# Patient Record
Sex: Female | Born: 1950 | Race: White | Hispanic: No | Marital: Married | Smoking: Former smoker
Health system: Southern US, Community
[De-identification: ages and names within clinical notes are randomized; demographics above are authoritative.]

## PROBLEM LIST (undated history)

## (undated) DIAGNOSIS — D649 Anemia, unspecified: Secondary | ICD-10-CM

## (undated) DIAGNOSIS — I10 Essential (primary) hypertension: Secondary | ICD-10-CM

## (undated) DIAGNOSIS — E669 Obesity, unspecified: Secondary | ICD-10-CM

## (undated) DIAGNOSIS — IMO0001 Reserved for inherently not codable concepts without codable children: Secondary | ICD-10-CM

## (undated) DIAGNOSIS — K56609 Unspecified intestinal obstruction, unspecified as to partial versus complete obstruction: Secondary | ICD-10-CM

## (undated) DIAGNOSIS — C569 Malignant neoplasm of unspecified ovary: Secondary | ICD-10-CM

## (undated) DIAGNOSIS — Z5189 Encounter for other specified aftercare: Secondary | ICD-10-CM

## (undated) DIAGNOSIS — M1990 Unspecified osteoarthritis, unspecified site: Secondary | ICD-10-CM

## (undated) DIAGNOSIS — Z86711 Personal history of pulmonary embolism: Secondary | ICD-10-CM

## (undated) DIAGNOSIS — E039 Hypothyroidism, unspecified: Secondary | ICD-10-CM

## (undated) HISTORY — DX: Reserved for inherently not codable concepts without codable children: IMO0001

## (undated) HISTORY — DX: Obesity, unspecified: E66.9

## (undated) HISTORY — DX: Encounter for other specified aftercare: Z51.89

## (undated) HISTORY — PX: COLON RESECTION: SHX5231

## (undated) HISTORY — PX: ABDOMINAL HYSTERECTOMY: SHX81

## (undated) HISTORY — PX: TOTAL ABDOMINAL HYSTERECTOMY W/ BILATERAL SALPINGOOPHORECTOMY: SHX83

## (undated) HISTORY — DX: Essential (primary) hypertension: I10

## (undated) HISTORY — PX: OTHER SURGICAL HISTORY: SHX169

## (undated) HISTORY — DX: Personal history of pulmonary embolism: Z86.711

## (undated) HISTORY — DX: Malignant neoplasm of unspecified ovary: C56.9

## (undated) HISTORY — DX: Unspecified intestinal obstruction, unspecified as to partial versus complete obstruction: K56.609

---

## 2002-10-04 ENCOUNTER — Encounter: Payer: Self-pay | Admitting: Family Medicine

## 2002-10-04 ENCOUNTER — Ambulatory Visit (HOSPITAL_COMMUNITY): Admission: RE | Admit: 2002-10-04 | Discharge: 2002-10-04 | Payer: Self-pay | Admitting: Family Medicine

## 2004-03-18 ENCOUNTER — Ambulatory Visit (HOSPITAL_COMMUNITY): Admission: RE | Admit: 2004-03-18 | Discharge: 2004-03-18 | Payer: Self-pay | Admitting: Family Medicine

## 2004-03-21 ENCOUNTER — Encounter (HOSPITAL_COMMUNITY): Admission: RE | Admit: 2004-03-21 | Discharge: 2004-04-20 | Payer: Self-pay | Admitting: General Surgery

## 2004-03-28 ENCOUNTER — Inpatient Hospital Stay (HOSPITAL_COMMUNITY): Admission: RE | Admit: 2004-03-28 | Discharge: 2004-03-30 | Payer: Self-pay | Admitting: General Surgery

## 2004-04-12 ENCOUNTER — Inpatient Hospital Stay (HOSPITAL_COMMUNITY): Admission: EM | Admit: 2004-04-12 | Discharge: 2004-04-16 | Payer: Self-pay | Admitting: Emergency Medicine

## 2004-04-15 ENCOUNTER — Ambulatory Visit: Payer: Self-pay | Admitting: Oncology

## 2004-04-17 ENCOUNTER — Encounter (HOSPITAL_COMMUNITY): Admission: RE | Admit: 2004-04-17 | Discharge: 2004-05-17 | Payer: Self-pay | Admitting: Oncology

## 2004-04-17 ENCOUNTER — Encounter: Admission: RE | Admit: 2004-04-17 | Discharge: 2004-04-17 | Payer: Self-pay | Admitting: Oncology

## 2004-04-25 ENCOUNTER — Ambulatory Visit (HOSPITAL_COMMUNITY): Admission: RE | Admit: 2004-04-25 | Discharge: 2004-04-25 | Payer: Self-pay | Admitting: General Surgery

## 2004-04-29 ENCOUNTER — Ambulatory Visit (HOSPITAL_COMMUNITY): Payer: Self-pay | Admitting: Oncology

## 2004-05-20 ENCOUNTER — Ambulatory Visit: Admission: RE | Admit: 2004-05-20 | Discharge: 2004-05-20 | Payer: Self-pay | Admitting: Gynecology

## 2004-05-21 ENCOUNTER — Encounter (HOSPITAL_COMMUNITY): Admission: RE | Admit: 2004-05-21 | Discharge: 2004-06-20 | Payer: Self-pay | Admitting: Oncology

## 2004-05-21 ENCOUNTER — Encounter: Admission: RE | Admit: 2004-05-21 | Discharge: 2004-05-21 | Payer: Self-pay | Admitting: Oncology

## 2004-07-02 ENCOUNTER — Ambulatory Visit (HOSPITAL_COMMUNITY): Payer: Self-pay | Admitting: Oncology

## 2004-07-02 ENCOUNTER — Encounter (HOSPITAL_COMMUNITY): Admission: RE | Admit: 2004-07-02 | Discharge: 2004-08-01 | Payer: Self-pay | Admitting: Oncology

## 2004-07-02 ENCOUNTER — Encounter: Admission: RE | Admit: 2004-07-02 | Discharge: 2004-07-02 | Payer: Self-pay | Admitting: Oncology

## 2004-07-09 ENCOUNTER — Ambulatory Visit: Admission: RE | Admit: 2004-07-09 | Discharge: 2004-07-09 | Payer: Self-pay | Admitting: Gynecology

## 2004-08-06 ENCOUNTER — Encounter: Admission: RE | Admit: 2004-08-06 | Discharge: 2004-08-06 | Payer: Self-pay | Admitting: Oncology

## 2004-08-06 ENCOUNTER — Encounter (HOSPITAL_COMMUNITY): Admission: RE | Admit: 2004-08-06 | Discharge: 2004-09-05 | Payer: Self-pay | Admitting: Oncology

## 2004-08-20 ENCOUNTER — Ambulatory Visit (HOSPITAL_COMMUNITY): Payer: Self-pay | Admitting: Oncology

## 2004-09-09 ENCOUNTER — Encounter: Admission: RE | Admit: 2004-09-09 | Discharge: 2004-09-09 | Payer: Self-pay | Admitting: Oncology

## 2004-09-09 ENCOUNTER — Encounter (HOSPITAL_COMMUNITY): Admission: RE | Admit: 2004-09-09 | Discharge: 2004-10-09 | Payer: Self-pay | Admitting: Oncology

## 2004-10-08 ENCOUNTER — Ambulatory Visit: Admission: RE | Admit: 2004-10-08 | Discharge: 2004-10-08 | Payer: Self-pay | Admitting: Gynecology

## 2004-10-09 ENCOUNTER — Ambulatory Visit (HOSPITAL_COMMUNITY): Payer: Self-pay | Admitting: Oncology

## 2004-10-15 ENCOUNTER — Encounter: Admission: RE | Admit: 2004-10-15 | Discharge: 2004-10-18 | Payer: Self-pay | Admitting: Oncology

## 2004-10-15 ENCOUNTER — Encounter (HOSPITAL_COMMUNITY): Admission: RE | Admit: 2004-10-15 | Discharge: 2004-10-18 | Payer: Self-pay | Admitting: Oncology

## 2004-10-29 ENCOUNTER — Encounter (HOSPITAL_COMMUNITY): Admission: RE | Admit: 2004-10-29 | Discharge: 2004-11-28 | Payer: Self-pay | Admitting: Oncology

## 2004-10-29 ENCOUNTER — Encounter: Admission: RE | Admit: 2004-10-29 | Discharge: 2004-10-29 | Payer: Self-pay | Admitting: Oncology

## 2004-11-26 ENCOUNTER — Ambulatory Visit (HOSPITAL_COMMUNITY): Payer: Self-pay | Admitting: Oncology

## 2004-12-09 ENCOUNTER — Encounter: Admission: RE | Admit: 2004-12-09 | Discharge: 2004-12-09 | Payer: Self-pay | Admitting: Oncology

## 2004-12-09 ENCOUNTER — Encounter (HOSPITAL_COMMUNITY): Admission: RE | Admit: 2004-12-09 | Discharge: 2005-01-08 | Payer: Self-pay | Admitting: Oncology

## 2005-02-09 ENCOUNTER — Encounter (HOSPITAL_COMMUNITY): Admission: RE | Admit: 2005-02-09 | Discharge: 2005-03-11 | Payer: Self-pay | Admitting: Oncology

## 2005-02-09 ENCOUNTER — Ambulatory Visit (HOSPITAL_COMMUNITY): Payer: Self-pay | Admitting: Oncology

## 2005-02-09 ENCOUNTER — Encounter: Admission: RE | Admit: 2005-02-09 | Discharge: 2005-02-09 | Payer: Self-pay | Admitting: Oncology

## 2005-03-23 ENCOUNTER — Encounter: Admission: RE | Admit: 2005-03-23 | Discharge: 2005-03-23 | Payer: Self-pay | Admitting: Oncology

## 2005-04-03 ENCOUNTER — Ambulatory Visit: Admission: RE | Admit: 2005-04-03 | Discharge: 2005-04-03 | Payer: Self-pay | Admitting: Gynecology

## 2005-05-04 ENCOUNTER — Ambulatory Visit (HOSPITAL_COMMUNITY): Payer: Self-pay | Admitting: Oncology

## 2005-05-04 ENCOUNTER — Encounter: Admission: RE | Admit: 2005-05-04 | Discharge: 2005-05-04 | Payer: Self-pay | Admitting: Oncology

## 2005-06-16 ENCOUNTER — Encounter (HOSPITAL_COMMUNITY): Admission: RE | Admit: 2005-06-16 | Discharge: 2005-07-16 | Payer: Self-pay | Admitting: Oncology

## 2005-06-16 ENCOUNTER — Encounter: Admission: RE | Admit: 2005-06-16 | Discharge: 2005-06-16 | Payer: Self-pay | Admitting: Oncology

## 2005-07-14 ENCOUNTER — Ambulatory Visit (HOSPITAL_COMMUNITY): Payer: Self-pay | Admitting: Oncology

## 2005-07-28 ENCOUNTER — Encounter: Admission: RE | Admit: 2005-07-28 | Discharge: 2005-07-28 | Payer: Self-pay | Admitting: Oncology

## 2005-09-08 ENCOUNTER — Encounter: Admission: RE | Admit: 2005-09-08 | Discharge: 2005-09-08 | Payer: Self-pay | Admitting: Oncology

## 2005-09-08 ENCOUNTER — Encounter (HOSPITAL_COMMUNITY): Admission: RE | Admit: 2005-09-08 | Discharge: 2005-10-08 | Payer: Self-pay | Admitting: Oncology

## 2005-10-02 ENCOUNTER — Ambulatory Visit: Admission: RE | Admit: 2005-10-02 | Discharge: 2005-10-02 | Payer: Self-pay | Admitting: Gynecology

## 2005-10-16 ENCOUNTER — Ambulatory Visit (HOSPITAL_COMMUNITY): Payer: Self-pay | Admitting: Oncology

## 2005-10-16 ENCOUNTER — Encounter: Admission: RE | Admit: 2005-10-16 | Discharge: 2005-10-16 | Payer: Self-pay | Admitting: Oncology

## 2005-11-27 ENCOUNTER — Encounter: Admission: RE | Admit: 2005-11-27 | Discharge: 2005-11-27 | Payer: Self-pay | Admitting: Oncology

## 2005-11-27 ENCOUNTER — Encounter (HOSPITAL_COMMUNITY): Admission: RE | Admit: 2005-11-27 | Discharge: 2005-12-27 | Payer: Self-pay | Admitting: Oncology

## 2005-12-29 ENCOUNTER — Ambulatory Visit (HOSPITAL_COMMUNITY): Payer: Self-pay | Admitting: Oncology

## 2005-12-29 ENCOUNTER — Encounter (HOSPITAL_COMMUNITY): Admission: RE | Admit: 2005-12-29 | Discharge: 2006-01-18 | Payer: Self-pay | Admitting: Oncology

## 2006-02-19 ENCOUNTER — Ambulatory Visit (HOSPITAL_COMMUNITY): Payer: Self-pay | Admitting: Oncology

## 2006-02-19 ENCOUNTER — Encounter (HOSPITAL_COMMUNITY): Admission: RE | Admit: 2006-02-19 | Discharge: 2006-03-21 | Payer: Self-pay | Admitting: Oncology

## 2006-04-07 ENCOUNTER — Ambulatory Visit: Admission: RE | Admit: 2006-04-07 | Discharge: 2006-04-07 | Payer: Self-pay | Admitting: Gynecology

## 2006-05-14 ENCOUNTER — Encounter (HOSPITAL_COMMUNITY): Admission: RE | Admit: 2006-05-14 | Discharge: 2006-06-13 | Payer: Self-pay | Admitting: Oncology

## 2006-05-14 ENCOUNTER — Ambulatory Visit (HOSPITAL_COMMUNITY): Payer: Self-pay | Admitting: Oncology

## 2006-08-13 ENCOUNTER — Encounter (HOSPITAL_COMMUNITY): Admission: RE | Admit: 2006-08-13 | Discharge: 2006-09-12 | Payer: Self-pay | Admitting: Oncology

## 2006-08-13 ENCOUNTER — Ambulatory Visit (HOSPITAL_COMMUNITY): Payer: Self-pay | Admitting: Oncology

## 2006-10-29 ENCOUNTER — Encounter (HOSPITAL_COMMUNITY): Admission: RE | Admit: 2006-10-29 | Discharge: 2006-11-28 | Payer: Self-pay | Admitting: Oncology

## 2006-10-29 ENCOUNTER — Ambulatory Visit (HOSPITAL_COMMUNITY): Payer: Self-pay | Admitting: Oncology

## 2007-01-27 ENCOUNTER — Encounter (HOSPITAL_COMMUNITY): Admission: RE | Admit: 2007-01-27 | Discharge: 2007-02-26 | Payer: Self-pay | Admitting: Oncology

## 2007-01-27 ENCOUNTER — Ambulatory Visit (HOSPITAL_COMMUNITY): Payer: Self-pay | Admitting: Oncology

## 2007-04-01 ENCOUNTER — Ambulatory Visit (HOSPITAL_COMMUNITY): Admission: RE | Admit: 2007-04-01 | Discharge: 2007-04-01 | Payer: Self-pay | Admitting: Family Medicine

## 2007-04-20 ENCOUNTER — Ambulatory Visit: Admission: RE | Admit: 2007-04-20 | Discharge: 2007-04-20 | Payer: Self-pay | Admitting: Gynecology

## 2007-04-22 ENCOUNTER — Ambulatory Visit (HOSPITAL_COMMUNITY): Payer: Self-pay | Admitting: Oncology

## 2007-04-22 ENCOUNTER — Encounter (HOSPITAL_COMMUNITY): Admission: RE | Admit: 2007-04-22 | Discharge: 2007-05-22 | Payer: Self-pay | Admitting: Oncology

## 2007-06-03 ENCOUNTER — Encounter (HOSPITAL_COMMUNITY): Admission: RE | Admit: 2007-06-03 | Discharge: 2007-07-03 | Payer: Self-pay | Admitting: Oncology

## 2007-07-01 ENCOUNTER — Ambulatory Visit (HOSPITAL_COMMUNITY): Payer: Self-pay | Admitting: Oncology

## 2007-07-29 ENCOUNTER — Encounter (HOSPITAL_COMMUNITY): Admission: RE | Admit: 2007-07-29 | Discharge: 2007-08-28 | Payer: Self-pay | Admitting: Oncology

## 2007-09-06 ENCOUNTER — Encounter (HOSPITAL_COMMUNITY): Admission: RE | Admit: 2007-09-06 | Discharge: 2007-10-06 | Payer: Self-pay | Admitting: Oncology

## 2007-09-06 ENCOUNTER — Ambulatory Visit (HOSPITAL_COMMUNITY): Payer: Self-pay | Admitting: Oncology

## 2007-10-18 ENCOUNTER — Ambulatory Visit (HOSPITAL_COMMUNITY): Admission: RE | Admit: 2007-10-18 | Discharge: 2007-10-18 | Payer: Self-pay | Admitting: Obstetrics & Gynecology

## 2007-11-23 ENCOUNTER — Ambulatory Visit (HOSPITAL_COMMUNITY): Payer: Self-pay | Admitting: Oncology

## 2007-11-23 ENCOUNTER — Encounter (HOSPITAL_COMMUNITY): Admission: RE | Admit: 2007-11-23 | Discharge: 2007-12-23 | Payer: Self-pay | Admitting: Oncology

## 2008-01-24 ENCOUNTER — Ambulatory Visit (HOSPITAL_COMMUNITY): Payer: Self-pay | Admitting: Oncology

## 2008-01-24 ENCOUNTER — Encounter (HOSPITAL_COMMUNITY): Admission: RE | Admit: 2008-01-24 | Discharge: 2008-02-23 | Payer: Self-pay | Admitting: Oncology

## 2008-03-23 ENCOUNTER — Ambulatory Visit (HOSPITAL_COMMUNITY): Payer: Self-pay | Admitting: Oncology

## 2008-03-23 ENCOUNTER — Encounter (HOSPITAL_COMMUNITY): Admission: RE | Admit: 2008-03-23 | Discharge: 2008-04-22 | Payer: Self-pay | Admitting: Oncology

## 2008-04-13 ENCOUNTER — Ambulatory Visit: Admission: RE | Admit: 2008-04-13 | Discharge: 2008-04-13 | Payer: Self-pay | Admitting: Gynecology

## 2008-05-22 ENCOUNTER — Ambulatory Visit (HOSPITAL_COMMUNITY): Payer: Self-pay | Admitting: Oncology

## 2008-07-17 ENCOUNTER — Encounter (HOSPITAL_COMMUNITY): Admission: RE | Admit: 2008-07-17 | Discharge: 2008-08-16 | Payer: Self-pay | Admitting: Oncology

## 2008-07-17 ENCOUNTER — Ambulatory Visit (HOSPITAL_COMMUNITY): Payer: Self-pay | Admitting: Oncology

## 2008-09-26 ENCOUNTER — Ambulatory Visit (HOSPITAL_COMMUNITY): Payer: Self-pay | Admitting: Oncology

## 2008-10-22 ENCOUNTER — Ambulatory Visit (HOSPITAL_COMMUNITY): Admission: RE | Admit: 2008-10-22 | Discharge: 2008-10-22 | Payer: Self-pay | Admitting: Family Medicine

## 2008-10-23 ENCOUNTER — Encounter (HOSPITAL_COMMUNITY): Admission: RE | Admit: 2008-10-23 | Discharge: 2008-11-22 | Payer: Self-pay | Admitting: Oncology

## 2008-12-18 ENCOUNTER — Ambulatory Visit (HOSPITAL_COMMUNITY): Payer: Self-pay | Admitting: Oncology

## 2009-02-12 ENCOUNTER — Encounter (HOSPITAL_COMMUNITY): Admission: RE | Admit: 2009-02-12 | Discharge: 2009-03-14 | Payer: Self-pay | Admitting: Oncology

## 2009-02-13 ENCOUNTER — Ambulatory Visit (HOSPITAL_COMMUNITY): Payer: Self-pay | Admitting: Oncology

## 2009-04-03 ENCOUNTER — Ambulatory Visit: Admission: RE | Admit: 2009-04-03 | Discharge: 2009-04-03 | Payer: Self-pay | Admitting: Gynecology

## 2009-05-21 ENCOUNTER — Ambulatory Visit (HOSPITAL_COMMUNITY): Payer: Self-pay | Admitting: Oncology

## 2009-07-16 ENCOUNTER — Encounter (HOSPITAL_COMMUNITY): Admission: RE | Admit: 2009-07-16 | Discharge: 2009-08-15 | Payer: Self-pay | Admitting: Oncology

## 2009-07-16 ENCOUNTER — Ambulatory Visit (HOSPITAL_COMMUNITY): Payer: Self-pay | Admitting: Oncology

## 2009-09-10 ENCOUNTER — Ambulatory Visit (HOSPITAL_COMMUNITY): Payer: Self-pay | Admitting: Oncology

## 2009-10-24 ENCOUNTER — Ambulatory Visit (HOSPITAL_COMMUNITY): Admission: RE | Admit: 2009-10-24 | Discharge: 2009-10-24 | Payer: Self-pay | Admitting: Family Medicine

## 2009-10-28 ENCOUNTER — Ambulatory Visit (HOSPITAL_COMMUNITY): Payer: Self-pay | Admitting: Oncology

## 2009-12-23 ENCOUNTER — Encounter (HOSPITAL_COMMUNITY): Admission: RE | Admit: 2009-12-23 | Payer: Self-pay | Admitting: Oncology

## 2010-02-09 ENCOUNTER — Encounter (HOSPITAL_COMMUNITY): Payer: Self-pay | Admitting: Oncology

## 2010-03-04 ENCOUNTER — Other Ambulatory Visit (HOSPITAL_COMMUNITY): Payer: Self-pay

## 2010-03-04 ENCOUNTER — Encounter (HOSPITAL_COMMUNITY): Payer: Self-pay | Attending: Oncology

## 2010-03-04 DIAGNOSIS — I10 Essential (primary) hypertension: Secondary | ICD-10-CM | POA: Insufficient documentation

## 2010-03-04 DIAGNOSIS — Z9221 Personal history of antineoplastic chemotherapy: Secondary | ICD-10-CM | POA: Insufficient documentation

## 2010-03-04 DIAGNOSIS — Z8543 Personal history of malignant neoplasm of ovary: Secondary | ICD-10-CM | POA: Insufficient documentation

## 2010-03-04 DIAGNOSIS — C569 Malignant neoplasm of unspecified ovary: Secondary | ICD-10-CM

## 2010-04-04 ENCOUNTER — Ambulatory Visit: Payer: Self-pay | Attending: Gynecology | Admitting: Gynecology

## 2010-04-04 DIAGNOSIS — E669 Obesity, unspecified: Secondary | ICD-10-CM | POA: Insufficient documentation

## 2010-04-04 DIAGNOSIS — Z86718 Personal history of other venous thrombosis and embolism: Secondary | ICD-10-CM | POA: Insufficient documentation

## 2010-04-04 DIAGNOSIS — C569 Malignant neoplasm of unspecified ovary: Secondary | ICD-10-CM | POA: Insufficient documentation

## 2010-04-04 DIAGNOSIS — I10 Essential (primary) hypertension: Secondary | ICD-10-CM | POA: Insufficient documentation

## 2010-04-06 LAB — DIFFERENTIAL
Basophils Absolute: 0 10*3/uL (ref 0.0–0.1)
Basophils Relative: 1 % (ref 0–1)
Eosinophils Absolute: 0.2 10*3/uL (ref 0.0–0.7)
Eosinophils Relative: 3 % (ref 0–5)
Lymphocytes Relative: 37 % (ref 12–46)
Lymphs Abs: 2.4 10*3/uL (ref 0.7–4.0)
Monocytes Absolute: 0.7 10*3/uL (ref 0.1–1.0)
Monocytes Relative: 10 % (ref 3–12)
Neutro Abs: 3.1 10*3/uL (ref 1.7–7.7)
Neutrophils Relative %: 49 % (ref 43–77)

## 2010-04-06 LAB — CBC
HCT: 38.2 % (ref 36.0–46.0)
Hemoglobin: 12.7 g/dL (ref 12.0–15.0)
MCH: 28.6 pg (ref 26.0–34.0)
MCHC: 33.2 g/dL (ref 30.0–36.0)
MCV: 86.3 fL (ref 78.0–100.0)
Platelets: 190 10*3/uL (ref 150–400)
RBC: 4.43 MIL/uL (ref 3.87–5.11)
RDW: 13.7 % (ref 11.5–15.5)
WBC: 6.4 10*3/uL (ref 4.0–10.5)

## 2010-04-06 LAB — COMPREHENSIVE METABOLIC PANEL
ALT: 23 U/L (ref 0–35)
AST: 25 U/L (ref 0–37)
Albumin: 3.6 g/dL (ref 3.5–5.2)
Alkaline Phosphatase: 95 U/L (ref 39–117)
BUN: 12 mg/dL (ref 6–23)
CO2: 30 mEq/L (ref 19–32)
Calcium: 8.6 mg/dL (ref 8.4–10.5)
Chloride: 102 mEq/L (ref 96–112)
Creatinine, Ser: 0.61 mg/dL (ref 0.4–1.2)
GFR calc Af Amer: >60 mL/min (ref >60)
GFR calc non Af Amer: >60 mL/min (ref >60)
Glucose, Bld: 87 mg/dL (ref 70–99)
Potassium: 3.7 mEq/L (ref 3.5–5.1)
Sodium: 138 mEq/L (ref 135–145)
Total Bilirubin: 0.6 mg/dL (ref 0.3–1.2)
Total Protein: 6.8 g/dL (ref 6.0–8.3)

## 2010-04-06 LAB — CA 125
CA 125: 9.7 U/mL (ref 0.0–30.2)
CA 125: 8.5 U/mL (ref 0.0–30.2)

## 2010-04-11 NOTE — Consult Note (Signed)
  Annette Clark, Annette Clark                ACCOUNT NO.:  [id number]  MEDICAL RECORD NO.:  [id number]           PATIENT TYPE:  LOCATION:                                 FACILITY:  PHYSICIAN:  De Blanch, M.D.DATE OF BIRTH:  21-Apr-1950  DATE OF CONSULTATION:  04/04/2010 DATE OF DISCHARGE:                                CONSULTATION   CHIEF COMPLAINT:  Ovarian cancer.  INTERVAL HISTORY:  The patient returns today for continuing followup as previously scheduled.  Since her last visit, she has done well.  She denies any GI or GU symptoms; has no pelvic pain, pressure, vaginal bleeding, or discharge.  Her CA-125 was recently obtained and it was 7 units per mL.  Overall, she seems to be doing quite well.  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  She received 6 cycles of carboplatin and Taxol chemotherapy and then has been followed in remission since that time.  PAST MEDICAL HISTORY/MEDICAL ILLNESSES: 1. Pulmonary embolus. 2. Obesity. 3. Hypertension.  PAST SURGICAL HISTORY:  Laparotomy for ovarian cyst, laparoscopy, laparotomy for ovarian cancer and debulking in March 2006.  SOCIAL HISTORY:  The patient is married.  She does not smoke or drink. She is currently unemployed.  DRUG ALLERGIES:  None.  FAMILY HISTORY:  Negative for gynecologic, breast, or colon cancer.  REVIEW OF SYSTEMS:  A 10-point comprehensive review of systems negative except as noted above.  PHYSICAL EXAMINATION:  VITAL SIGNS:  Weight 298 pounds (stable), blood pressure 132/72. GENERAL:  The patient is an obese white female, in no acute distress. HEENT:  Negative. NECK:  Supple without thyromegaly.  There is no supraclavicular or inguinal adenopathy. ABDOMEN:  Obese, soft, nontender.  No mass, organomegaly, ascites, or hernias are noted. PELVIC:  EGBUS, vagina bladder, urethra are normal.  Cervix and uterus are surgically absent.  Adnexa  without masses.  RECTOVAGINAL:  Confirms.  IMPRESSION: 1. Stage IIIC ovarian cancer, optimally debulked, clinically free of     disease, and with a normal CA-125. 2. Obesity.  The patient is encouraged to consider weight watchers and     increase her exercise.  She will return to see Dr. Mariel Clark in 6     months and return to see [place] in 1 year.     De Blanch, M.D.     DC/MEDQ  D:  04/04/2010  T:  04/04/2010  Job:  [id number]  cc:   Annette Horns. Mariel Sleet, MD Fax: [phone]  Annette Clark, R.N. [address] [place], [place] [address]  Annette Clark, M.D. Fax: [phone]  Annette Clark, M.D. Fax: [phone]  Annette Clark  Electronically Signed by De Blanch M.D. on 04/08/2010 06:42:19 AM

## 2010-04-24 LAB — CBC
HCT: 38.5 % (ref 36.0–46.0)
Hemoglobin: 12.9 g/dL (ref 12.0–15.0)
MCHC: 33.4 g/dL (ref 30.0–36.0)
MCV: 86.6 fL (ref 78.0–100.0)
Platelets: 170 10*3/uL (ref 150–400)
RBC: 4.45 MIL/uL (ref 3.87–5.11)
RDW: 13.6 % (ref 11.5–15.5)
WBC: 5.7 10*3/uL (ref 4.0–10.5)

## 2010-04-24 LAB — COMPREHENSIVE METABOLIC PANEL
ALT: 24 U/L (ref 0–35)
AST: 24 U/L (ref 0–37)
Albumin: 3.9 g/dL (ref 3.5–5.2)
Alkaline Phosphatase: 94 U/L (ref 39–117)
BUN: 15 mg/dL (ref 6–23)
CO2: 32 mEq/L (ref 19–32)
Calcium: 9.2 mg/dL (ref 8.4–10.5)
Chloride: 104 mEq/L (ref 96–112)
Creatinine, Ser: 0.6 mg/dL (ref 0.4–1.2)
GFR calc Af Amer: >60 mL/min (ref >60)
GFR calc non Af Amer: >60 mL/min (ref >60)
Glucose, Bld: 99 mg/dL (ref 70–99)
Potassium: 3.6 mEq/L (ref 3.5–5.1)
Sodium: 141 mEq/L (ref 135–145)
Total Bilirubin: 0.6 mg/dL (ref 0.3–1.2)
Total Protein: 6.9 g/dL (ref 6.0–8.3)

## 2010-04-24 LAB — DIFFERENTIAL
Basophils Absolute: 0 10*3/uL (ref 0.0–0.1)
Basophils Relative: 1 % (ref 0–1)
Eosinophils Absolute: 0.2 10*3/uL (ref 0.0–0.7)
Eosinophils Relative: 3 % (ref 0–5)
Lymphocytes Relative: 38 % (ref 12–46)
Lymphs Abs: 2.1 10*3/uL (ref 0.7–4.0)
Monocytes Absolute: 0.6 10*3/uL (ref 0.1–1.0)
Monocytes Relative: 11 % (ref 3–12)
Neutro Abs: 2.7 10*3/uL (ref 1.7–7.7)
Neutrophils Relative %: 48 % (ref 43–77)

## 2010-04-28 LAB — CA 125: CA 125: 7.2 U/mL (ref 0.0–30.2)

## 2010-04-29 ENCOUNTER — Encounter (HOSPITAL_COMMUNITY): Payer: Self-pay | Attending: Oncology

## 2010-04-29 DIAGNOSIS — Z8543 Personal history of malignant neoplasm of ovary: Secondary | ICD-10-CM | POA: Insufficient documentation

## 2010-04-29 DIAGNOSIS — Z9221 Personal history of antineoplastic chemotherapy: Secondary | ICD-10-CM | POA: Insufficient documentation

## 2010-04-29 DIAGNOSIS — C569 Malignant neoplasm of unspecified ovary: Secondary | ICD-10-CM

## 2010-04-29 DIAGNOSIS — I10 Essential (primary) hypertension: Secondary | ICD-10-CM | POA: Insufficient documentation

## 2010-04-29 DIAGNOSIS — Z452 Encounter for adjustment and management of vascular access device: Secondary | ICD-10-CM

## 2010-05-01 LAB — CA 125: CA 125: 7.1 U/mL (ref 0.0–30.2)

## 2010-05-05 LAB — CA 125: CA 125: 7.8 U/mL (ref 0.0–30.2)

## 2010-05-05 LAB — POTASSIUM: Potassium: 3.7 mEq/L (ref 3.5–5.1)

## 2010-06-03 NOTE — Consult Note (Signed)
Annette Clark, Annette Clark                ACCOUNT NO.:  [id number]   MEDICAL RECORD NO.:  [id number]          PATIENT TYPE:  OUT   LOCATION:  GYN                          FACILITY:  [facility]   PHYSICIAN:  De Blanch, M.D.DATE OF BIRTH:  Jun 10, 1950   DATE OF CONSULTATION:  04/13/2008  DATE OF DISCHARGE:                                 CONSULTATION   CHIEF COMPLAINT:  Ovarian cancer.   INTERVAL HISTORY:  The patient returns today for continuing follow-up  now approximately 4 years since her initial surgery for stage IIIc  ovarian cancer.  She last saw Dr. Mariel Sleet approximately 6 months ago.  Since then she has done well.  She denies any GI or GU symptoms. She has  no pelvic pain, pressure, vaginal bleed or discharge.  Her functional  status is excellent.   She had a ca125 obtained last week which was 7 units/mL (stable).   HISTORY OF PRESENT ILLNESS:  The patient underwent optimal surgical  debulking of the stage IIIc ovarian cancer March of 2006.  She was  treated with 6 cycles of carboplatin and Taxol chemotherapy  postoperatively. She has been followed since that time with no evidence  of recurrent disease.   PAST MEDICAL HISTORY:  Medical illnesses:  Pulmonary embolus (resolved  and not on anticoagulants any more).  Obesity, hypertension.   PAST SURGICAL HISTORY:  Laparotomy for ovarian cyst, laparoscopy,  laparotomy for ovarian cancer (optimal debulking) March 2006.   SOCIAL HISTORY:  The patient is married.  She does not smoke or drink.   DRUG ALLERGIES:  None.   FAMILY HISTORY:  Negative for gynecology, breast, or colon cancer.   REVIEW OF SYSTEMS:  A 10-point comprehensive review of systems was  negative except as noted above.   PHYSICAL EXAMINATION:  VITAL SIGNS: Weight 285 pounds, blood pressure  140/90.  GENERAL: The patient is a healthy white female in no acute distress.  HEENT: Negative.  NECK: Supple without thyromegaly.  There is no supraclavicular or  inguinal adenopathy.  ABDOMEN: Obese, soft and nontender.  No masses, organomegaly, ascites,  or hernias noted.  PELVIC: EG, BUS, vagina, and urethra are normal.  Cervix and uterus are  surgically absent. Adnexa without masses.  Rectovaginal exam confirms.  Lower extremities are 1+ ankle edema.   IMPRESSION:  1. Stage IIIc ovarian cancer, now with 4 years of follow-up.  No      evidence of disease and normal tumor marker.  2. Obesity.   The patient is to return to see Dr. Mariel Sleet in 6 months, and we will  continue to monitor ca125 values in 22-month intervals. She will return  to see me in 1 year. She is strongly encouraged to enter a weight  reduction program.      De Blanch, M.D.  Electronically Signed     DC/MEDQ  D:  04/13/2008  T:  04/13/2008  Job:  [id number]   cc:   Telford Nab, R.N.  [address]  [place], [place] [address]   Ladona Horns. Mariel Sleet, MD  Fax: [phone]   Barbaraann Barthel, M.D.  Fax: [phone]  Lazaro Arms, M.D.  Fax: [phone]   Patrica Duel, M.D.  Fax: [phone]

## 2010-06-03 NOTE — Consult Note (Signed)
Annette Clark, AUGUST                ACCOUNT NO.:  [id number]   MEDICAL RECORD NO.:  [id number]          PATIENT TYPE:  OUT   LOCATION:  GYN                          [facility]ACILITY[facility]  [facility]   PHYSICIAN:  De Blanch, M.D.DATE OF BIRTH:  1950/12/16   DATE OF CONSULTATION:  04/20/2007  DATE OF DISCHARGE:                                 CONSULTATION   GYN/ONCOLOGY CLINIC CONSULTATION   CHIEF COMPLAINT:  Ovarian cancer.  Patient returns today for continuing  followup of her ovarian cancer.  Since her last visit, she has been  seeing Dr. Mariel Sleet.  He has been obtaining CA125's approximately every  three months.  She reports that her last CA125 was 7 units/ml.  Her only  symptom is that approximately three or four days while she was visiting  in [facility] with her church group, she developed abdominal  discomfort and nausea and vomiting.  This is now resolved.  Her appetite  is good, and her functional status is fine.  She has no other GI or GU  symptoms, and no pelvic pain, pressure, vaginal bleeding, or a  discharge.   HISTORY OF PRESENT ILLNESS:  The patient underwent initial debulking of  Stage III-C ovarian cancer (optimal debulking) in March of 2006.  She  received six cycles of Carboplatin and Taxol chemotherapy  postoperatively and has been followed since that time remaining in  remission.   PAST MEDICAL HISTORY:  Medical illnesses:  1. Pulmonary embolus (resolved).  2. Obesity.  3. Hypertension.   PAST SURGICAL HISTORY:  1. Laparotomy for ovarian cyst as a teenager.  2. Laparoscopy.  3. Laparotomy for ovarian cancer debulking in 2006.   SOCIAL HISTORY:  The patient is married, she does not smoke or drink.   DRUG ALLERGIES:  None.   FAMILY HISTORY:  Negative for gynecologic, breast, or colon cancer.   REVIEW OF SYSTEMS:  A 10-point comprehensive review of systems negative  except as noted above.   PHYSICAL EXAMINATION:  VITAL SIGNS:  Weight is 283 pounds,  blood  pressure 130/80, pulse 80, respiratory rate 20.  GENERAL:  The patient is an obese, white female in no acute distress.  HEENT:  Negative.  NECK:  Supple without thyromegaly, there is no supraclavicular or  inguinal adenopathy.  ABDOMEN:  Soft, obese, and nontender, no masses, organomegaly, ascites,  or hernias are noted.  PELVIC EXAM:  EG-BUS, the vagina, bladder, and urethra are normal, the  cervix and uterus are surgically absent, adnexa without masses.  Rectovaginal exam confirms.  LOWER EXTREMITIES:  1+ ankle edema.   IMPRESSION:  Stage III-C optimally debulked ovarian cancer, March of  2006, and no evidence of recurrent disease.   The patient will continue to have CA125's obtained by Dr. Mariel Sleet and  will return to see him in approximately  three months, he will continue  to follow her, and we will plan on seeing the patient back in one year  or sooner if necessary.      De Blanch, M.D.  Electronically Signed     DC/MEDQ  D:  04/20/2007  T:  04/20/2007  Job:  [id number]   cc:   Telford Nab, R.N.  [address]  [place], [place] [address]   Ladona Horns. Mariel Sleet, MD  Fax: [phone]   Barbaraann Barthel, M.D.  Fax: [phone]   Lazaro Arms, M.D.  Fax: [phone]   Patrica Duel, M.D.  Fax: [phone]

## 2010-06-06 NOTE — Op Note (Signed)
Annette Clark, Annette Clark                ACCOUNT NO.:  [id number]   MEDICAL RECORD NO.:  [id number]          PATIENT TYPE:  AMB   LOCATION:  DAY                           FACILITY:  [facility]   PHYSICIAN:  Barbaraann Barthel, M.D. DATE OF BIRTH:  [birth year]   DATE OF PROCEDURE:  04/25/2004  DATE OF DISCHARGE:                                 OPERATIVE REPORT   DIAGNOSIS:  Ovarian cancer.   PROCEDURE:  Placement of right subclavian Port-A-Cath under fluoroscopy.   SPECIMENS:  None.   NOTE:  This is a 60 year old white female who was found to have ovarian CA  and has recently had radical hysterectomy by Dr. Loree Fee. She had  problems postoperatively with a pulmonary embolus. She was treated with  Coumadin, and then her anticoagulants were adjusted to allow placement of  Port-A-Cath by the oncology service, removing her from Coumadin and placing  her on Lovenox therapy. We then took her to surgery for the placement of the  Port-A-Cath. We discussed complications not limited to but including  bleeding, infection, pneumothorax and catheter embolization. Informed  consent was obtained.   GROSS OPERATIVE FINDINGS:  The patient had a Port-A-Cath placed via the  right subclavian vein clinically without problem under direct fluoroscopy,  the tip of which was in the distal end of the superior vena cava. Final  chest x-ray in the recovery room is pending.   TECHNIQUE:  The patient was placed in the supine position and in  Trendelenburg, and after prepping and draping in the usual manner, I  anesthetize an area below the right clavicle. An 18-gauge needle was then  placed in the subclavian vein with a guidewire threaded through this under  direct fluoroscopy without problems. We then used a vein dilating device and  placed the catheter over the guidewire and through the venous dilator and  through the through the cannula and placed this in good position. We then  cut the catheter to an appropriate  length, anesthetized the subcutaneous  area to form a subcutaneous pocket and then connected the infusion device,  aspirated to make sure there were no air bubbles, and then infused with  heparin. We then placed this in the subcutaneous pocket and then closed the  skin with a subcutaneous layer of 4-0 Polysorb and 5-0 Polysorb to close the  skin with subcuticular layer. Steri-Strips, 2x2, Neosporin and an OpSite  dressing was applied. Prior to closure, all sponge, needle and instrument  counts were found to be correct. Estimated blood loss was minimal. There  were no complications. Final chest x-ray is pending.   This patient will go to see oncology department prior to her departure to  make sure she knows when her next appointment is and to make sure that she  understands all of the follow-up regarding her anticoagulation.      WB/MEDQ  D:  04/25/2004  T:  04/25/2004  Job:  [id number]   cc:   Ladona Horns. Neijstrom, MD  [address]  [place]  [place] [address]  Fax: [phone]   De Blanch, M.D.

## 2010-06-06 NOTE — Discharge Summary (Signed)
NAMEKOYA, HUNGER                ACCOUNT NO.:  [id number]   MEDICAL RECORD NO.:  [id number]          PATIENT TYPE:  INP   LOCATION:  A310                          FACILITY:  [facility]   PHYSICIAN:  Lazaro Arms, M.D.   DATE OF BIRTH:  [birth year]   DATE OF ADMISSION:  04/12/2004  DATE OF DISCHARGE:  03/29/2006LH                                 DISCHARGE SUMMARY   DISCHARGE DIAGNOSES:  1.  Status post staging procedure for stage 3 ovarian cancer by De Blanch, M.D. down at [facility]  2.  Postoperative pulmonary embolism on Coumadin.  3.  Postoperative complication of vaginal bleeding.  4.  Excessive anticoagulation.   Please refer to the transcribed history and physical for details on the  admission to the hospital.   HOSPITAL COURSE:  The patient was admitted with vaginal bleeding with a  significantly elevated INR. As a result, we stopped her Lovenox and  diminished her Coumadin. [facility] were made aware and they  participated in her care to manage her anticoagulation medication. Dr.  Mariel Sleet was also made aware so preparations were made for her beginning  chemotherapy for her stage 3C ovarian cancer. The patient did well with  diminishing her INR, did not have any symptoms with her pulmonary embolism  or any deep venous thrombosis problems. As a result, she was discharged to  home on April 16, 2004, four days in the hospital  to followup the next day  at [facility] to have her INR checked. We will see her back in the  office as needed.      LHE/MEDQ  D:  05/14/2004  T:  05/14/2004  Job:  [id number]

## 2010-06-06 NOTE — H&P (Signed)
NAME:  Annette Clark, Annette Clark                ACCOUNT NO.:  [id number]   MEDICAL RECORD NO.:  [id number]           PATIENT TYPE:   LOCATION:                                 FACILITY:   PHYSICIAN:  Lazaro Arms, M.D.   DATE OF BIRTH:  [birth year]   DATE OF ADMISSION:  04/12/2004  DATE OF DISCHARGE:  LH                                HISTORY & PHYSICAL   HISTORY OF PRESENT ILLNESS:  Annette Clark is a 60 year old white female with  what sounds to be a stage 3-C adenocarcinoma of the ovary. The patient is  status post a staging laparotomy by Dr. Stanford Breed in [place] on  March 31, 2004. She was in the hospital for 9 days postoperatively.  Unfortunately, she developed a deep vein thrombosis and pulmonary embolus  and was started on Lovenox and Coumadin. She was seen yesterday by a local  medical doctor at [facility], and her PT and PTT were significantly  elevated. As a result, they told her stop her Lovenox and to cut her  Coumadin back from 5 mg to 3 mg a day. She presented to the emergency room  this morning complaining of new onset of vaginal bleeding. On exam, she does  indeed have fresh vaginal bleeding. Exam reveals no specific bleeder of the  cuff, just general oozing. My impression is that she developed a hematoma of  the vaginal cuff from her postoperative anticoagulation, and she is draining  it at this point. Otherwise, the cuff is intact. Her PT is 26, her PTT is  76, and her INR is 3.6. As a result, she is admitted for evaluation of her  vaginal bleeding from intraperitoneal site for management of her over  anticoagulation and management of that, and also she has some areas of her  incision that appear to be infected. Additionally, if she has a vaginal cuff  hematoma, I want to cover with antibiotics because of the chance of  intraperitoneal infection.   PAST MEDICAL HISTORY:  Hypertension.   PAST SURGICAL HISTORY:  She recently was going to have a laparoscopic  cholecystectomy by Dr. Malvin Johns. 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 Stanford Breed.   REVIEW OF SYSTEMS:  As per HPI, otherwise negative.   MEDICATIONS:  Coumadin, ibuprofen, Lovenox, and the patient states she is on  a hypertensive medicine that starts with L but she is not sure what it is.   PHYSICAL EXAMINATION:  HEENT:  Unremarkable. Thyroid is normal.  LUNGS:  Clear.  HEART:  Regular rhythm without regurgitation or gallop.  ABDOMEN:  Postoperative. Her incision has areas that appear to be  superficially infected. She has positive bowel sounds.  PELVIC:  She has blood in the vaginal cuff. No active bleeding. There is no  definitive cuff hematoma palpated. There is no evidence of cuff cellulitis.  EXTREMITIES:  Warm with no edema.   IMPRESSION:  1.  Stage 3-C adenocarcinoma of the ovary.  2.  Postoperative pulmonary embolus.  3.  Postoperative vaginal cuff  bleeding.  4.  Elevated anticoagulation.   PLAN:  The patient is admitted for evaluation of her cuff hematoma. Also  management of her anticoagulation. She was started on antibiotic with  Levaquin and will decrease her Coumadin to 2.5 mg at night. Dr. Phillips Odor has  been consulted and is aware of the patient's admission so he can manage her  anticoagulant state. The patient otherwise is not having any shortness of  breath or any other symptoms related to pulmonary embolus and at this time  can go a regular bed.      LHE/MEDQ  D:  04/12/2004  T:  04/12/2004  Job:  [id number]

## 2010-06-24 ENCOUNTER — Other Ambulatory Visit (HOSPITAL_COMMUNITY): Payer: Self-pay

## 2010-08-25 ENCOUNTER — Other Ambulatory Visit (HOSPITAL_COMMUNITY): Payer: Self-pay

## 2010-08-25 ENCOUNTER — Encounter (HOSPITAL_COMMUNITY): Payer: BC Managed Care – PPO | Attending: Oncology

## 2010-08-25 DIAGNOSIS — Z452 Encounter for adjustment and management of vascular access device: Secondary | ICD-10-CM

## 2010-08-25 DIAGNOSIS — C569 Malignant neoplasm of unspecified ovary: Secondary | ICD-10-CM

## 2010-08-25 MED ORDER — HEPARIN SOD (PORK) LOCK FLUSH 100 UNIT/ML IV SOLN
INTRAVENOUS | Status: AC
  Filled 2010-08-25: qty 5

## 2010-10-08 LAB — CA 125: CA 125: 7.6

## 2010-10-14 LAB — DIFFERENTIAL
Basophils Absolute: 0
Basophils Relative: 1
Eosinophils Absolute: 0.2
Eosinophils Relative: 4
Lymphs Abs: 1.8
Neutrophils Relative %: 42 — ABNORMAL LOW

## 2010-10-14 LAB — CBC
Hemoglobin: 12.9
MCHC: 34.5
RBC: 4.36
WBC: 4.3

## 2010-10-14 LAB — COMPREHENSIVE METABOLIC PANEL
ALT: 34
AST: 26
Alkaline Phosphatase: 91
CO2: 28
Calcium: 8.4
Chloride: 104
GFR calc Af Amer: >60
GFR calc non Af Amer: >60
Glucose, Bld: 124 — ABNORMAL HIGH
Potassium: 3.4 — ABNORMAL LOW
Sodium: 138
Total Bilirubin: 0.6

## 2010-10-15 LAB — POTASSIUM: Potassium: 3.3 — ABNORMAL LOW

## 2010-10-16 LAB — CBC
MCV: 88.2
RBC: 4.56
WBC: 4.3

## 2010-10-16 LAB — COMPREHENSIVE METABOLIC PANEL
ALT: 29
AST: 28
Alkaline Phosphatase: 85
CO2: 29
Chloride: 105
GFR calc Af Amer: >60
GFR calc non Af Amer: >60
Potassium: 3.4 — ABNORMAL LOW
Sodium: 141
Total Bilirubin: 0.8

## 2010-10-16 LAB — DIFFERENTIAL
Basophils Absolute: 0
Eosinophils Absolute: 0.1
Eosinophils Relative: 3
Monocytes Absolute: 0.3

## 2010-10-16 LAB — CA 125: CA 125: 7.2

## 2010-10-16 LAB — POTASSIUM: Potassium: 4.6

## 2010-10-20 ENCOUNTER — Encounter (HOSPITAL_COMMUNITY): Payer: Self-pay

## 2010-10-21 ENCOUNTER — Other Ambulatory Visit (HOSPITAL_COMMUNITY): Payer: Self-pay | Admitting: Family Medicine

## 2010-10-21 DIAGNOSIS — Z139 Encounter for screening, unspecified: Secondary | ICD-10-CM

## 2010-10-21 LAB — CA 125: CA 125: 5.7

## 2010-10-27 ENCOUNTER — Encounter (HOSPITAL_COMMUNITY): Payer: Self-pay | Admitting: Oncology

## 2010-10-27 ENCOUNTER — Encounter (HOSPITAL_COMMUNITY): Payer: Self-pay | Attending: Oncology | Admitting: Oncology

## 2010-10-27 ENCOUNTER — Encounter (HOSPITAL_COMMUNITY): Payer: Self-pay

## 2010-10-27 ENCOUNTER — Ambulatory Visit (HOSPITAL_COMMUNITY)
Admission: RE | Admit: 2010-10-27 | Discharge: 2010-10-27 | Disposition: A | Discharge status: Home / Self Care | Payer: Self-pay | Source: Ambulatory Visit | Attending: Family Medicine | Admitting: Family Medicine

## 2010-10-27 VITALS — BP 122/84 | HR 80 | Temp 97.4°F | Wt 303.0 lb

## 2010-10-27 DIAGNOSIS — C569 Malignant neoplasm of unspecified ovary: Secondary | ICD-10-CM | POA: Insufficient documentation

## 2010-10-27 DIAGNOSIS — Z139 Encounter for screening, unspecified: Secondary | ICD-10-CM

## 2010-10-27 DIAGNOSIS — Z1231 Encounter for screening mammogram for malignant neoplasm of breast: Secondary | ICD-10-CM | POA: Insufficient documentation

## 2010-10-27 LAB — CBC
MCH: 28.4 pg (ref 26.0–34.0)
MCHC: 32.1 g/dL (ref 30.0–36.0)
MCV: 88.7 fL (ref 78.0–100.0)
Platelets: 208 10*3/uL (ref 150–400)

## 2010-10-27 LAB — COMPREHENSIVE METABOLIC PANEL
ALT: 25 U/L (ref 0–35)
AST: 24 U/L (ref 0–37)
Calcium: 9.4 mg/dL (ref 8.4–10.5)
Creatinine, Ser: <0.47 mg/dL — ABNORMAL LOW (ref 0.50–1.10)
Glucose, Bld: 90 mg/dL (ref 70–99)
Sodium: 142 mEq/L (ref 135–145)
Total Protein: 6.8 g/dL (ref 6.0–8.3)

## 2010-10-27 MED ORDER — HEPARIN SOD (PORK) LOCK FLUSH 100 UNIT/ML IV SOLN
INTRAVENOUS | Status: AC
  Administered 2010-10-27: 500 [IU]
  Filled 2010-10-27: qty 5

## 2010-10-27 MED ORDER — SODIUM CHLORIDE 0.9 % IJ SOLN
INTRAMUSCULAR | Status: AC
  Administered 2010-10-27: 11:00:00
  Filled 2010-10-27: qty 10

## 2010-10-27 NOTE — Progress Notes (Signed)
Annette Clark presented for Portacath access and flush. Proper placement of portacath confirmed by CXR. Portacath located RT chest wall accessed with  H 20 needle. Good blood return present. Portacath flushed with 20ml NS and 500U/56ml Heparin and needle removed intact. Procedure without incident. Patient tolerated procedure well.

## 2010-10-27 NOTE — Patient Instructions (Signed)
[facility]  Discharge Instructions  RECOMMENDATIONS MADE BY THE CONSULTANT AND ANY TEST RESULTS WILL BE SENT TO YOUR REFERRING DOCTOR.   EXAM FINDINGS BY MD TODAY AND SIGNS AND SYMPTOMS TO REPORT TO CLINIC OR PRIMARY MD:  Port flushes scheduled every 8 weeks.   Port flush with labs in April.   To return to Dr. Mariel Sleet in 1 year October 2013.  To see dietician here at [facility] on 2nd floor. She will call to set up appt.    I acknowledge that I have been informed and understand all the instructions given to me and received a copy. I do not have any more questions at this time, but understand that I may call the Specialty Clinic at [facility] at [phone] during business hours should I have any further questions or need assistance in obtaining follow-up care.    __________________________________________  _____________  __________ Signature of Patient or Authorized Representative            Date                   Time    __________________________________________ Nurse's Signature

## 2010-10-28 ENCOUNTER — Telehealth (HOSPITAL_COMMUNITY): Payer: Self-pay | Admitting: *Deleted

## 2010-10-28 NOTE — Telephone Encounter (Signed)
Message copied by Dennie Maizes on Tue Oct 28, 2010  2:14 PM ------      Message from: Mariel Sleet, ERIC S      Created: Tue Oct 28, 2010 11:15 AM       Labs great-call her.

## 2010-10-28 NOTE — Telephone Encounter (Signed)
Message left on answering machine as below. 

## 2010-10-30 LAB — COMPREHENSIVE METABOLIC PANEL
ALT: 39 — ABNORMAL HIGH
AST: 35
Albumin: 3.6
Alkaline Phosphatase: 91
CO2: 30
Chloride: 104
Creatinine, Ser: 0.63
GFR calc Af Amer: >60
GFR calc non Af Amer: >60
Potassium: 3.6
Sodium: 140
Total Bilirubin: 0.6

## 2010-10-30 LAB — DIFFERENTIAL
Basophils Absolute: 0
Basophils Relative: 0
Eosinophils Absolute: 0.2
Eosinophils Relative: 3
Lymphocytes Relative: 37
Monocytes Absolute: 0.6

## 2010-10-30 LAB — CBC
MCV: 85.5
Platelets: 205
RBC: 4.36
WBC: 6.3

## 2010-10-30 LAB — CA 125: CA 125: 8.2

## 2010-10-31 NOTE — Progress Notes (Signed)
**Note Annette-Identified via Obfuscation** CC:   Annette Clark, M.D. Dr. Wilfred Curtis, M.D. Telford Nab, R.N.  DIAGNOSES: 1. Stage III cancer of the ovary, status post cytoreductive surgery on     04/01/2004, followed by 6 cycles of carboplatin and Abraxane with     normalization of her CA125 level after 2 cycles.  She has remained     disease free since.  Preoperatively her CA125 level was 509 and the     most recent one I have is from February 2012 and was 7.0.  The one     from today is pending. 2. Morbid obesity, now weighing 303 pounds on a height of 5 feet 5     inches.  Her BMI is clearly over 40 with that weight.  HISTORY:  Annette Clark still is not able to get gainful employment.  She has not been able to get any weight reduction.  She has actually gone up since I have seen her.  She states that she saw Annette Clark in the spring of this year, but I have no notes from him.  I cannot find any labs in the computer from him.  She states that she and her husband are still living together, but they eat a lot of bread, it sounds like and potatoes.  I have offered her a consultation with the dietitian and she has accepted it.  She is due for mammography today.  REVIEW OF SYSTEMS:  She is having no complaints on review of systems that she is aware of.  PHYSICAL EXAMINATION:  Vital Signs:  Weight 303 pounds, blood pressure 122/84, right arm sitting position.  She is just on lisinopril she states at this juncture.  Pulse right around 80 and regular. Respirations 16 to 18 and unlabored.  Lymph:  She has no lymphadenopathy.  Neck:  She has no obvious thyromegaly.  Lungs:  Clear to auscultation and percussion.  She of course remains morbidly obese. Skin:  She has skin lesions, which I believe are just pigmented seborrheic keratoses, some with hair cells coming through them, but I do believe benign.  They do not look like melanomas to me.  She has 1 on her right upper back, 1 left mid back, and  they are not changed from my prior memory.  Heart:  Shows a regular rhythm and rate without murmur, rub, or gallop.  Abdomen:  Obese without obvious masses.  Bowel sounds are diminished.  Extremities:  She has thick lower extremities, but no pitting edema whatsoever.  Dorsalis pedis pulses are 2+ and symmetric. I cannot feel posterior tibialis pulses.  Neurologic:  She is alert and oriented.  Facial symmetry is intact.  Annette Clark obviously has a significant issue of morbid obesity to deal with and I do not think she is handling it very well.  I think if she was a candidate for surgery, I think I would recommend it, but I am not sure anybody would take her with very low if any insurance.  We will at least get her to meet with our dietician and hope that she will go.  She certainly needs a lifestyle change, if nothing else.  We will see her in a year.  She is to see Annette Clark in the spring.  We will get a CA125 in 6 months.  I got a CBC, diff, a CMET, and CA125 level today.  We will see her sooner if need be, but so far she remains disease free.    ______________________________  Annette Horns. Mariel Sleet, MD ESN/MEDQ  D:  10/27/2010  T:  10/27/2010  Job:  [id number]

## 2010-11-03 LAB — CA 125: CA 125: 8.8

## 2010-12-22 ENCOUNTER — Encounter (HOSPITAL_COMMUNITY): Payer: BC Managed Care – PPO | Attending: Oncology

## 2010-12-22 DIAGNOSIS — C569 Malignant neoplasm of unspecified ovary: Secondary | ICD-10-CM | POA: Insufficient documentation

## 2010-12-22 DIAGNOSIS — Z452 Encounter for adjustment and management of vascular access device: Secondary | ICD-10-CM

## 2010-12-22 MED ORDER — SODIUM CHLORIDE 0.9 % IJ SOLN
10.0000 mL | INTRAMUSCULAR | Status: DC | PRN
  Administered 2010-12-22: 10 mL via INTRAVENOUS
  Filled 2010-12-22: qty 10

## 2010-12-22 MED ORDER — HEPARIN SOD (PORK) LOCK FLUSH 100 UNIT/ML IV SOLN
500.0000 [IU] | Freq: Once | INTRAVENOUS | Status: AC
  Administered 2010-12-22: 500 [IU] via INTRAVENOUS
  Filled 2010-12-22: qty 5

## 2010-12-22 MED ORDER — HEPARIN SOD (PORK) LOCK FLUSH 100 UNIT/ML IV SOLN
INTRAVENOUS | Status: AC
  Administered 2010-12-22: 500 [IU] via INTRAVENOUS
  Filled 2010-12-22: qty 5

## 2010-12-22 NOTE — Progress Notes (Signed)
Annette Clark presented for Portacath access and flush. Proper placement of portacath confirmed by CXR. Portacath located rt chest wall accessed with  H 20 needle. Good blood return present. Portacath flushed with 20ml NS and 500U/5ml Heparin and needle removed intact. Procedure without incident. Patient tolerated procedure well.   

## 2011-02-23 ENCOUNTER — Encounter (HOSPITAL_COMMUNITY): Payer: BC Managed Care – PPO | Attending: Oncology

## 2011-02-23 DIAGNOSIS — Z452 Encounter for adjustment and management of vascular access device: Secondary | ICD-10-CM

## 2011-02-23 DIAGNOSIS — C569 Malignant neoplasm of unspecified ovary: Secondary | ICD-10-CM | POA: Insufficient documentation

## 2011-02-23 MED ORDER — SODIUM CHLORIDE 0.9 % IJ SOLN
INTRAMUSCULAR | Status: AC
  Administered 2011-02-23: 10 mL via INTRAVENOUS
  Filled 2011-02-23: qty 10

## 2011-02-23 MED ORDER — HEPARIN SOD (PORK) LOCK FLUSH 100 UNIT/ML IV SOLN
INTRAVENOUS | Status: AC
  Administered 2011-02-23: 500 [IU] via INTRAVENOUS
  Filled 2011-02-23: qty 5

## 2011-02-23 MED ORDER — SODIUM CHLORIDE 0.9 % IJ SOLN
10.0000 mL | Freq: Once | INTRAMUSCULAR | Status: AC
  Administered 2011-02-23: 10 mL via INTRAVENOUS
  Filled 2011-02-23: qty 10

## 2011-02-23 MED ORDER — HEPARIN SOD (PORK) LOCK FLUSH 100 UNIT/ML IV SOLN
500.0000 [IU] | Freq: Once | INTRAVENOUS | Status: AC
  Administered 2011-02-23: 500 [IU] via INTRAVENOUS
  Filled 2011-02-23: qty 5

## 2011-02-23 NOTE — Progress Notes (Signed)
Annette Clark presented for Portacath access and flush. Proper placement of portacath confirmed by CXR. Portacath located right chest wall accessed with  H 20 needle. Good blood return present. Portacath flushed with 20ml NS and 500U/5ml Heparin and needle removed intact. Procedure without incident. Patient tolerated procedure well.   

## 2011-04-06 ENCOUNTER — Encounter (HOSPITAL_COMMUNITY): Payer: Self-pay | Attending: Oncology

## 2011-04-06 DIAGNOSIS — C569 Malignant neoplasm of unspecified ovary: Secondary | ICD-10-CM

## 2011-04-06 MED ORDER — HEPARIN SOD (PORK) LOCK FLUSH 100 UNIT/ML IV SOLN
500.0000 [IU] | Freq: Once | INTRAVENOUS | Status: AC
  Administered 2011-04-06: 500 [IU] via INTRAVENOUS
  Filled 2011-04-06: qty 5

## 2011-04-06 MED ORDER — SODIUM CHLORIDE 0.9 % IJ SOLN
INTRAMUSCULAR | Status: AC
  Administered 2011-04-06: 10 mL via INTRAVENOUS
  Filled 2011-04-06: qty 10

## 2011-04-06 MED ORDER — SODIUM CHLORIDE 0.9 % IJ SOLN
10.0000 mL | INTRAMUSCULAR | Status: DC | PRN
  Administered 2011-04-06: 10 mL via INTRAVENOUS
  Filled 2011-04-06: qty 10

## 2011-04-06 MED ORDER — HEPARIN SOD (PORK) LOCK FLUSH 100 UNIT/ML IV SOLN
INTRAVENOUS | Status: AC
  Administered 2011-04-06: 500 [IU] via INTRAVENOUS
  Filled 2011-04-06: qty 5

## 2011-04-06 NOTE — Progress Notes (Signed)
Jacoba R Libman presented for Portacath access and flush. Proper placement of portacath confirmed by CXR. Portacath located right chest wall accessed with  H 20 needle. Good blood return present. Portacath flushed with 20ml NS and 500U/5ml Heparin and needle removed intact. Procedure without incident. Patient tolerated procedure well.   

## 2011-04-07 LAB — CA 125: CA 125: 8.1 U/mL (ref 0.0–30.2)

## 2011-04-20 ENCOUNTER — Other Ambulatory Visit (HOSPITAL_COMMUNITY): Payer: Self-pay

## 2011-04-27 ENCOUNTER — Encounter: Payer: Self-pay | Admitting: Gynecology

## 2011-04-27 ENCOUNTER — Ambulatory Visit: Payer: Self-pay | Attending: Gynecology | Admitting: Gynecology

## 2011-04-27 VITALS — BP 128/64 | HR 70 | Temp 98.1°F | Resp 16 | Ht 64.76 in | Wt 287.1 lb

## 2011-04-27 DIAGNOSIS — C569 Malignant neoplasm of unspecified ovary: Secondary | ICD-10-CM | POA: Insufficient documentation

## 2011-04-27 DIAGNOSIS — E669 Obesity, unspecified: Secondary | ICD-10-CM | POA: Insufficient documentation

## 2011-04-27 DIAGNOSIS — Z87891 Personal history of nicotine dependence: Secondary | ICD-10-CM | POA: Insufficient documentation

## 2011-04-27 DIAGNOSIS — Z9221 Personal history of antineoplastic chemotherapy: Secondary | ICD-10-CM | POA: Insufficient documentation

## 2011-04-27 DIAGNOSIS — I10 Essential (primary) hypertension: Secondary | ICD-10-CM | POA: Insufficient documentation

## 2011-04-27 DIAGNOSIS — Z90710 Acquired absence of both cervix and uterus: Secondary | ICD-10-CM | POA: Insufficient documentation

## 2011-04-27 NOTE — Patient Instructions (Signed)
Return in one year. Perirectal aces under weight loss heat up!
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
## 2011-05-18 ENCOUNTER — Encounter (HOSPITAL_COMMUNITY): Payer: BC Managed Care – PPO | Attending: Oncology

## 2011-05-18 DIAGNOSIS — C569 Malignant neoplasm of unspecified ovary: Secondary | ICD-10-CM

## 2011-05-18 MED ORDER — HEPARIN SOD (PORK) LOCK FLUSH 100 UNIT/ML IV SOLN
INTRAVENOUS | Status: AC
  Filled 2011-05-18: qty 5

## 2011-05-18 MED ORDER — HEPARIN SOD (PORK) LOCK FLUSH 100 UNIT/ML IV SOLN
500.0000 [IU] | Freq: Once | INTRAVENOUS | Status: DC
  Filled 2011-05-18: qty 5

## 2011-05-18 MED ORDER — SODIUM CHLORIDE 0.9 % IJ SOLN
INTRAMUSCULAR | Status: AC
  Filled 2011-05-18: qty 10

## 2011-05-18 MED ORDER — SODIUM CHLORIDE 0.9 % IJ SOLN
10.0000 mL | INTRAMUSCULAR | Status: DC | PRN
  Filled 2011-05-18: qty 10

## 2011-05-18 NOTE — Progress Notes (Signed)
Annette Clark presented for Portacath access and flush. Proper placement of portacath confirmed by CXR. Portacath located right chest wall accessed with  H 20 needle. Good blood return present. Portacath flushed with 20ml NS and 500U/5ml Heparin and needle removed intact. Procedure without incident. Patient tolerated procedure well.   

## 2011-07-13 ENCOUNTER — Encounter (HOSPITAL_COMMUNITY): Payer: Self-pay | Attending: Oncology

## 2011-07-13 DIAGNOSIS — Z9889 Other specified postprocedural states: Secondary | ICD-10-CM | POA: Insufficient documentation

## 2011-07-13 DIAGNOSIS — Z95828 Presence of other vascular implants and grafts: Secondary | ICD-10-CM

## 2011-07-13 DIAGNOSIS — C569 Malignant neoplasm of unspecified ovary: Secondary | ICD-10-CM

## 2011-07-13 DIAGNOSIS — Z452 Encounter for adjustment and management of vascular access device: Secondary | ICD-10-CM

## 2011-07-13 MED ORDER — SODIUM CHLORIDE 0.9 % IJ SOLN
10.0000 mL | Freq: Once | INTRAMUSCULAR | Status: AC
  Administered 2011-07-13: 10 mL via INTRAVENOUS
  Filled 2011-07-13: qty 10

## 2011-07-13 MED ORDER — SODIUM CHLORIDE 0.9 % IJ SOLN
INTRAMUSCULAR | Status: AC
  Filled 2011-07-13: qty 10

## 2011-07-13 MED ORDER — HEPARIN SOD (PORK) LOCK FLUSH 100 UNIT/ML IV SOLN
500.0000 [IU] | Freq: Once | INTRAVENOUS | Status: AC
  Administered 2011-07-13: 500 [IU] via INTRAVENOUS
  Filled 2011-07-13: qty 5

## 2011-07-13 MED ORDER — HEPARIN SOD (PORK) LOCK FLUSH 100 UNIT/ML IV SOLN
INTRAVENOUS | Status: AC
  Filled 2011-07-13: qty 5

## 2011-07-13 NOTE — Addendum Note (Signed)
Addended by: Oda Kilts on: 07/13/2011 12:55 PM   Modules accepted: Orders

## 2011-07-13 NOTE — Progress Notes (Signed)
Annette Clark presented for Portacath access and flush. Proper placement of portacath confirmed by CXR. Portacath located RT chest wall accessed with  H 20 needle. Good blood return present. Portacath flushed with 20ml NS and 500U/5ml Heparin and needle removed intact. Procedure without incident. Patient tolerated procedure well.   

## 2011-07-14 LAB — CA 125: CA 125: 7.5 U/mL (ref 0.0–30.2)

## 2011-09-07 ENCOUNTER — Encounter (HOSPITAL_COMMUNITY): Payer: BC Managed Care – PPO | Attending: Oncology

## 2011-09-07 DIAGNOSIS — Z95828 Presence of other vascular implants and grafts: Secondary | ICD-10-CM

## 2011-09-07 DIAGNOSIS — C569 Malignant neoplasm of unspecified ovary: Secondary | ICD-10-CM

## 2011-09-07 DIAGNOSIS — Z452 Encounter for adjustment and management of vascular access device: Secondary | ICD-10-CM

## 2011-09-07 DIAGNOSIS — Z9889 Other specified postprocedural states: Secondary | ICD-10-CM | POA: Insufficient documentation

## 2011-09-07 MED ORDER — SODIUM CHLORIDE 0.9 % IJ SOLN
10.0000 mL | INTRAMUSCULAR | Status: DC | PRN
  Administered 2011-09-07: 10 mL via INTRAVENOUS
  Filled 2011-09-07: qty 10

## 2011-09-07 MED ORDER — HEPARIN SOD (PORK) LOCK FLUSH 100 UNIT/ML IV SOLN
INTRAVENOUS | Status: AC
  Filled 2011-09-07: qty 5

## 2011-09-07 MED ORDER — SODIUM CHLORIDE 0.9 % IJ SOLN
INTRAMUSCULAR | Status: AC
Start: 2011-09-07 — End: 2011-09-07
  Filled 2011-09-07: qty 10

## 2011-09-07 MED ORDER — HEPARIN SOD (PORK) LOCK FLUSH 100 UNIT/ML IV SOLN
500.0000 [IU] | Freq: Once | INTRAVENOUS | Status: AC
  Administered 2011-09-07: 500 [IU] via INTRAVENOUS
  Filled 2011-09-07: qty 5

## 2011-09-07 NOTE — Progress Notes (Signed)
Tolerated port flush well. 

## 2011-09-29 ENCOUNTER — Other Ambulatory Visit (HOSPITAL_COMMUNITY): Payer: Self-pay | Admitting: Family Medicine

## 2011-09-30 ENCOUNTER — Other Ambulatory Visit (HOSPITAL_COMMUNITY): Payer: Self-pay | Admitting: Family Medicine

## 2011-09-30 DIAGNOSIS — Z139 Encounter for screening, unspecified: Secondary | ICD-10-CM

## 2011-10-26 ENCOUNTER — Encounter (HOSPITAL_COMMUNITY): Payer: Self-pay

## 2011-10-27 ENCOUNTER — Encounter (HOSPITAL_BASED_OUTPATIENT_CLINIC_OR_DEPARTMENT_OTHER): Payer: Self-pay | Admitting: Oncology

## 2011-10-27 ENCOUNTER — Encounter (HOSPITAL_COMMUNITY): Payer: Self-pay | Attending: Oncology

## 2011-10-27 ENCOUNTER — Encounter (HOSPITAL_COMMUNITY): Payer: Self-pay | Admitting: Oncology

## 2011-10-27 ENCOUNTER — Ambulatory Visit (HOSPITAL_COMMUNITY): Payer: Self-pay

## 2011-10-27 VITALS — BP 150/83 | HR 69 | Temp 98.7°F | Ht 65.0 in | Wt 293.0 lb

## 2011-10-27 DIAGNOSIS — Z95828 Presence of other vascular implants and grafts: Secondary | ICD-10-CM

## 2011-10-27 DIAGNOSIS — Z86711 Personal history of pulmonary embolism: Secondary | ICD-10-CM

## 2011-10-27 DIAGNOSIS — C569 Malignant neoplasm of unspecified ovary: Secondary | ICD-10-CM

## 2011-10-27 DIAGNOSIS — Z9889 Other specified postprocedural states: Secondary | ICD-10-CM | POA: Insufficient documentation

## 2011-10-27 LAB — CBC WITH DIFFERENTIAL/PLATELET
Basophils Relative: 1 % (ref 0–1)
Eosinophils Absolute: 0.2 10*3/uL (ref 0.0–0.7)
HCT: 39.5 % (ref 36.0–46.0)
Hemoglobin: 12.8 g/dL (ref 12.0–15.0)
MCH: 28.2 pg (ref 26.0–34.0)
MCHC: 32.4 g/dL (ref 30.0–36.0)
Monocytes Absolute: 0.4 10*3/uL (ref 0.1–1.0)
Monocytes Relative: 6 % (ref 3–12)
Neutro Abs: 3.3 10*3/uL (ref 1.7–7.7)

## 2011-10-27 LAB — COMPREHENSIVE METABOLIC PANEL
Albumin: 3.9 g/dL (ref 3.5–5.2)
BUN: 15 mg/dL (ref 6–23)
Chloride: 101 mEq/L (ref 96–112)
Creatinine, Ser: 0.59 mg/dL (ref 0.50–1.10)
GFR calc Af Amer: >90 mL/min (ref >90)
Total Bilirubin: 0.5 mg/dL (ref 0.3–1.2)

## 2011-10-27 MED ORDER — SODIUM CHLORIDE 0.9 % IJ SOLN
20.0000 mL | INTRAMUSCULAR | Status: DC | PRN
  Administered 2011-10-27: 20 mL via INTRAVENOUS
  Filled 2011-10-27: qty 20

## 2011-10-27 MED ORDER — HEPARIN SOD (PORK) LOCK FLUSH 100 UNIT/ML IV SOLN
500.0000 [IU] | Freq: Once | INTRAVENOUS | Status: AC
  Administered 2011-10-27: 500 [IU] via INTRAVENOUS
  Filled 2011-10-27: qty 5

## 2011-10-27 MED ORDER — SODIUM CHLORIDE 0.9 % IJ SOLN
10.0000 mL | INTRAMUSCULAR | Status: DC | PRN
  Filled 2011-10-27: qty 10

## 2011-10-27 MED ORDER — SODIUM CHLORIDE 0.9 % IJ SOLN
INTRAMUSCULAR | Status: AC
  Filled 2011-10-27: qty 10

## 2011-10-27 MED ORDER — HEPARIN SOD (PORK) LOCK FLUSH 100 UNIT/ML IV SOLN
500.0000 [IU] | Freq: Once | INTRAVENOUS | Status: DC
  Filled 2011-10-27: qty 5

## 2011-10-27 NOTE — Patient Instructions (Addendum)
[facility]  Discharge Instructions  RECOMMENDATIONS MADE BY THE CONSULTANT AND ANY TEST RESULTS WILL BE SENT TO YOUR REFERRING DOCTOR.   EXAM FINDINGS BY MD TODAY AND SIGNS AND SYMPTOMS TO REPORT TO CLINIC OR PRIMARY MD: Exam and discussion per MD.  Report any abdominal swelling, pain or shortness of breath.  MEDICATIONS PRESCRIBED: none     SPECIAL INSTRUCTIONS/FOLLOW-UP: Return to Clinic for port flushes and for follow-up in 1 year.   I acknowledge that I have been informed and understand all the instructions given to me and received a copy. I do not have any more questions at this time, but understand that I may call the Specialty Clinic at [facility] at [phone] during business hours should I have any further questions or need assistance in obtaining follow-up care.    __________________________________________  _____________  __________ Signature of Patient or Authorized Representative            Date                   Time    __________________________________________ Nurse's Signature

## 2011-10-27 NOTE — Progress Notes (Signed)
Diagnosis number 1 stage IIIC ovarian cancer (optimally debulked) in March 2006 followed by 6 cycles of carboplatin and Taxol chemotherapy and she has remained disease-free since. Diagnosis #2 pulmonary embolus postoperatively treated with Coumadin for 6 months thus far without recurrence as well Diagnosis #3 morbid obesity weighing 293 pounds on 5 feet 5 inches of height. She still does not have health insurance. She states that she has a husband who is disabled and also gets some pension and she gets a little pension as well. Still think she needs to be evaluated for possible health insurance coverage even with this new affordable care act. I think she might be a candidate for bariatric surgery at some point since her life expectancy is definitely reduced just from this morbid obesity. She is asymptomatic however he is trying to walk 45 minutes 3-5 times per week.  I don't know she's really trying to change her diet to.  She has mammography scheduled for next month.  Vital signs are stable otherwise. Lymph nodes are negative throughout. Lungs are clear. Heart shows a regular rhythm and rate without murmur rub or gallop. Breast exam is negative for any masses. Abdomen is obese but without obvious masses or hepatosplenomegaly. Bowel sounds are normal. She has no arm or leg edema. Her blood work is pending from today. She will see Dr. Loree Fee in 6 months and I will see her in 12 months She remains disease free 7 years and 6 months postoperatively.

## 2011-11-02 ENCOUNTER — Ambulatory Visit (HOSPITAL_COMMUNITY)
Admission: RE | Admit: 2011-11-02 | Discharge: 2011-11-02 | Disposition: A | Discharge status: Home / Self Care | Payer: Self-pay | Source: Ambulatory Visit | Attending: Family Medicine | Admitting: Family Medicine

## 2011-11-02 DIAGNOSIS — Z1231 Encounter for screening mammogram for malignant neoplasm of breast: Secondary | ICD-10-CM | POA: Insufficient documentation

## 2011-11-02 DIAGNOSIS — Z139 Encounter for screening, unspecified: Secondary | ICD-10-CM

## 2011-12-22 ENCOUNTER — Encounter (HOSPITAL_COMMUNITY): Payer: BC Managed Care – PPO | Attending: Oncology

## 2011-12-22 DIAGNOSIS — Z452 Encounter for adjustment and management of vascular access device: Secondary | ICD-10-CM

## 2011-12-22 DIAGNOSIS — Z9889 Other specified postprocedural states: Secondary | ICD-10-CM | POA: Insufficient documentation

## 2011-12-22 DIAGNOSIS — C569 Malignant neoplasm of unspecified ovary: Secondary | ICD-10-CM | POA: Insufficient documentation

## 2011-12-22 MED ORDER — HEPARIN SOD (PORK) LOCK FLUSH 100 UNIT/ML IV SOLN
INTRAVENOUS | Status: AC
  Filled 2011-12-22: qty 5

## 2011-12-22 MED ORDER — SODIUM CHLORIDE 0.9 % IJ SOLN
INTRAMUSCULAR | Status: AC
  Filled 2011-12-22: qty 10

## 2011-12-22 MED ORDER — SODIUM CHLORIDE 0.9 % IJ SOLN
10.0000 mL | INTRAMUSCULAR | Status: DC | PRN
  Administered 2011-12-22: 10 mL via INTRAVENOUS
  Filled 2011-12-22: qty 10

## 2011-12-22 MED ORDER — HEPARIN SOD (PORK) LOCK FLUSH 100 UNIT/ML IV SOLN
500.0000 [IU] | Freq: Once | INTRAVENOUS | Status: AC
  Administered 2011-12-22: 500 [IU] via INTRAVENOUS
  Filled 2011-12-22: qty 5

## 2011-12-22 NOTE — Progress Notes (Signed)
Annette Clark presented for Portacath access and flush. Proper placement of portacath confirmed by CXR. Portacath located rt chest wall accessed with  H 20 needle. Good blood return present. Portacath flushed with 20ml NS and 500U/28ml Heparin and needle removed intact. Procedure without incident. Patient tolerated procedure well.

## 2012-02-16 ENCOUNTER — Encounter (HOSPITAL_COMMUNITY): Payer: BC Managed Care – PPO | Attending: Oncology

## 2012-02-16 VITALS — BP 133/68 | HR 78

## 2012-02-16 DIAGNOSIS — Z452 Encounter for adjustment and management of vascular access device: Secondary | ICD-10-CM

## 2012-02-16 DIAGNOSIS — C569 Malignant neoplasm of unspecified ovary: Secondary | ICD-10-CM

## 2012-02-16 DIAGNOSIS — Z95828 Presence of other vascular implants and grafts: Secondary | ICD-10-CM

## 2012-02-16 DIAGNOSIS — Z9889 Other specified postprocedural states: Secondary | ICD-10-CM | POA: Insufficient documentation

## 2012-02-16 MED ORDER — HEPARIN SOD (PORK) LOCK FLUSH 100 UNIT/ML IV SOLN
INTRAVENOUS | Status: AC
  Filled 2012-02-16: qty 5

## 2012-02-16 MED ORDER — HEPARIN SOD (PORK) LOCK FLUSH 100 UNIT/ML IV SOLN
500.0000 [IU] | Freq: Once | INTRAVENOUS | Status: AC
  Administered 2012-02-16: 500 [IU] via INTRAVENOUS
  Filled 2012-02-16: qty 5

## 2012-02-16 NOTE — Progress Notes (Signed)
Annette Clark presented for Portacath access and flush. Proper placement of portacath confirmed by CXR. Portacath located rt chest wall accessed with  H 20 needle. Good blood return present. Portacath flushed with 20ml NS and 500U/5ml Heparin and needle removed intact. Procedure without incident. Patient tolerated procedure well.   

## 2012-04-12 ENCOUNTER — Encounter (HOSPITAL_COMMUNITY): Payer: Self-pay

## 2012-04-26 ENCOUNTER — Encounter (HOSPITAL_COMMUNITY): Payer: BC Managed Care – PPO | Attending: Oncology

## 2012-04-26 DIAGNOSIS — C569 Malignant neoplasm of unspecified ovary: Secondary | ICD-10-CM | POA: Insufficient documentation

## 2012-04-26 LAB — CBC WITH DIFFERENTIAL/PLATELET
HCT: 37.2 % (ref 36.0–46.0)
Hemoglobin: 11.8 g/dL — ABNORMAL LOW (ref 12.0–15.0)
Lymphocytes Relative: 31 % (ref 12–46)
Lymphs Abs: 2 10*3/uL (ref 0.7–4.0)
MCHC: 31.7 g/dL (ref 30.0–36.0)
Monocytes Absolute: 0.4 10*3/uL (ref 0.1–1.0)
Monocytes Relative: 7 % (ref 3–12)
Neutro Abs: 3.9 10*3/uL (ref 1.7–7.7)
Neutrophils Relative %: 59 % (ref 43–77)
RBC: 4.22 MIL/uL (ref 3.87–5.11)
WBC: 6.6 10*3/uL (ref 4.0–10.5)

## 2012-04-26 LAB — COMPREHENSIVE METABOLIC PANEL
Albumin: 3.4 g/dL — ABNORMAL LOW (ref 3.5–5.2)
BUN: 18 mg/dL (ref 6–23)
Creatinine, Ser: 0.53 mg/dL (ref 0.50–1.10)
Total Protein: 6.7 g/dL (ref 6.0–8.3)

## 2012-04-26 MED ORDER — HEPARIN SOD (PORK) LOCK FLUSH 100 UNIT/ML IV SOLN
INTRAVENOUS | Status: AC
  Filled 2012-04-26: qty 5

## 2012-04-26 MED ORDER — SODIUM CHLORIDE 0.9 % IJ SOLN
10.0000 mL | INTRAMUSCULAR | Status: DC | PRN
  Administered 2012-04-26: 10 mL via INTRAVENOUS
  Filled 2012-04-26: qty 10

## 2012-04-26 MED ORDER — HEPARIN SOD (PORK) LOCK FLUSH 100 UNIT/ML IV SOLN
500.0000 [IU] | Freq: Once | INTRAVENOUS | Status: AC
  Administered 2012-04-26: 500 [IU] via INTRAVENOUS
  Filled 2012-04-26: qty 5

## 2012-04-26 NOTE — Addendum Note (Signed)
Addended by: Edythe Lynn A on: 04/26/2012 02:23 PM   Modules accepted: Orders

## 2012-04-26 NOTE — Progress Notes (Signed)
Annette Clark presented for Portacath access and flush. Proper placement of portacath confirmed by CXR. Portacath located lt chest wall accessed with  H 20 needle. Good blood return present. Portacath flushed with 20ml NS and 500U/37ml Heparin and needle removed intact. Procedure without incident. Patient tolerated procedure well.

## 2012-05-09 ENCOUNTER — Ambulatory Visit: Payer: BC Managed Care – PPO | Attending: Gynecology | Admitting: Gynecology

## 2012-05-09 ENCOUNTER — Telehealth: Payer: Self-pay

## 2012-05-09 ENCOUNTER — Other Ambulatory Visit: Payer: Self-pay

## 2012-05-09 ENCOUNTER — Encounter: Payer: Self-pay | Admitting: Gynecology

## 2012-05-09 VITALS — BP 128/82 | HR 66 | Temp 98.7°F | Resp 18 | Ht 64.76 in | Wt 296.1 lb

## 2012-05-09 DIAGNOSIS — Z1211 Encounter for screening for malignant neoplasm of colon: Secondary | ICD-10-CM

## 2012-05-09 DIAGNOSIS — C569 Malignant neoplasm of unspecified ovary: Secondary | ICD-10-CM | POA: Insufficient documentation
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
## 2012-05-09 NOTE — Telephone Encounter (Addendum)
   Gastroenterology Pre-Procedure Form  Pt has never had a colonoscopy/ she is a survivor of ovarian cancer Had 6 in of colon removed when she had surgery for her cancer  Request Date: 05/09/2012     Requesting Physician: Dr/ Phillips Odor     PATIENT INFORMATION:  Annette Clark is a 62 y.o., female (DOB=09-Mar-1950).  PROCEDURE: Procedure(s) requested: colonoscopy Procedure Reason: screening for colon cancer  PATIENT REVIEW QUESTIONS: The patient reports the following:   1. Diabetes Melitis: no 2. Joint replacements in the past 12 months: no 3. Major health problems in the past 3 months: no 4. Has an artificial valve or MVP:no 5. Has been advised in past to take antibiotics in advance of a procedure like teeth cleaning: no}    MEDICATIONS & ALLERGIES:    Patient reports the following regarding taking any blood thinners:   Plavix? no Aspirin?no Coumadin?  no  Patient confirms/reports the following medications:  Current Outpatient Prescriptions  Medication Sig Dispense Refill  . Glucosamine 500 MG TABS Take 1,000 mg by mouth daily.      Marland Kitchen lisinopril (PRINIVIL,ZESTRIL) 10 MG tablet Take 10 mg by mouth daily.         No current facility-administered medications for this visit.    Patient confirms/reports the following allergies:  No Known Allergies  Patient is appropriate to schedule for requested procedure(s): yes  AUTHORIZATION INFORMATION Primary Insurance:   ID #:   Group #:  Pre-Cert / Auth required: Pre-Cert / Auth #:   Secondary Insurance:  ID #:   Group #:  Pre-Cert / Auth required: Pre-Cert / Auth #:   No orders of the defined types were placed in this encounter.    SCHEDULE INFORMATION: Procedure has been scheduled as follows:  Date: 05/26/2012    Time: 1:15 PM  Location: [facility] Short Stay  This Gastroenterology Pre-Precedure Form is being routed to the following provider(s) for review: R. Roetta Sessions, MD

## 2012-05-09 NOTE — Telephone Encounter (Signed)
Pt was referred by Dr. Golding for screening colonoscopy. LMOM to call.  

## 2012-05-12 NOTE — Telephone Encounter (Signed)
Appropriate.

## 2012-05-13 ENCOUNTER — Encounter (HOSPITAL_COMMUNITY): Payer: Self-pay | Admitting: Pharmacy Technician

## 2012-05-13 MED ORDER — PEG-KCL-NACL-NASULF-NA ASC-C 100 G PO SOLR: 1.0000 | ORAL | Status: DC

## 2012-05-13 NOTE — Telephone Encounter (Signed)
Rx sent to the pharmacy and instructions mailed to pt.  

## 2012-05-16 ENCOUNTER — Telehealth: Payer: Self-pay

## 2012-05-16 MED ORDER — PEG 3350-KCL-NA BICARB-NACL 420 G PO SOLR: 4000.0000 mL | ORAL | Status: DC

## 2012-05-16 NOTE — Telephone Encounter (Signed)
Pt's insurance does not cover the Movie Prep. Sent Trilyte prep to pharmacy and mailed new instructions and called and informed pt.

## 2012-05-25 ENCOUNTER — Other Ambulatory Visit: Payer: Self-pay

## 2012-05-26 ENCOUNTER — Encounter (HOSPITAL_COMMUNITY): Payer: Self-pay | Admitting: *Deleted

## 2012-05-26 ENCOUNTER — Encounter (HOSPITAL_COMMUNITY)
Admission: RE | Disposition: A | Discharge status: Home / Self Care | Payer: Self-pay | Source: Ambulatory Visit | Attending: Internal Medicine

## 2012-05-26 ENCOUNTER — Ambulatory Visit (HOSPITAL_COMMUNITY)
Admission: RE | Admit: 2012-05-26 | Discharge: 2012-05-26 | Disposition: A | Discharge status: Home / Self Care | Payer: BC Managed Care – PPO | Source: Ambulatory Visit | Attending: Internal Medicine | Admitting: Internal Medicine

## 2012-05-26 DIAGNOSIS — Z1211 Encounter for screening for malignant neoplasm of colon: Secondary | ICD-10-CM

## 2012-05-26 DIAGNOSIS — D126 Benign neoplasm of colon, unspecified: Secondary | ICD-10-CM | POA: Insufficient documentation

## 2012-05-26 DIAGNOSIS — K5730 Diverticulosis of large intestine without perforation or abscess without bleeding: Secondary | ICD-10-CM

## 2012-05-26 DIAGNOSIS — I10 Essential (primary) hypertension: Secondary | ICD-10-CM | POA: Insufficient documentation

## 2012-05-26 HISTORY — PX: COLONOSCOPY: SHX5424

## 2012-05-26 SURGERY — COLONOSCOPY
Anesthesia: Moderate Sedation

## 2012-05-26 MED ORDER — ONDANSETRON HCL 4 MG/2ML IJ SOLN
INTRAMUSCULAR | Status: DC | PRN
  Administered 2012-05-26: 4 mg via INTRAVENOUS

## 2012-05-26 MED ORDER — SODIUM CHLORIDE 0.9 % IV SOLN
INTRAVENOUS | Status: DC
  Administered 2012-05-26: 13:00:00 via INTRAVENOUS

## 2012-05-26 MED ORDER — MIDAZOLAM HCL 5 MG/5ML IJ SOLN
INTRAMUSCULAR | Status: AC
  Filled 2012-05-26: qty 10

## 2012-05-26 MED ORDER — MIDAZOLAM HCL 5 MG/5ML IJ SOLN
INTRAMUSCULAR | Status: DC | PRN
  Administered 2012-05-26: 1 mg via INTRAVENOUS
  Administered 2012-05-26: 2 mg via INTRAVENOUS
  Administered 2012-05-26 (×2): 1 mg via INTRAVENOUS
  Administered 2012-05-26: 2 mg via INTRAVENOUS

## 2012-05-26 MED ORDER — MEPERIDINE HCL 100 MG/ML IJ SOLN
INTRAMUSCULAR | Status: AC
  Filled 2012-05-26: qty 1

## 2012-05-26 MED ORDER — ONDANSETRON HCL 4 MG/2ML IJ SOLN
INTRAMUSCULAR | Status: AC
  Filled 2012-05-26: qty 2

## 2012-05-26 MED ORDER — STERILE WATER FOR IRRIGATION IR SOLN
Status: DC | PRN
  Administered 2012-05-26: 13:00:00

## 2012-05-26 MED ORDER — MEPERIDINE HCL 100 MG/ML IJ SOLN
INTRAMUSCULAR | Status: DC | PRN
  Administered 2012-05-26 (×2): 50 mg via INTRAVENOUS
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
## 2012-05-26 NOTE — Op Note (Signed)
[facility] [address] [place], [address]   COLONOSCOPY PROCEDURE REPORT  PATIENT: Annette Clark, Annette Clark  MR#:         [id number] BIRTHDATE: Jun 20, 1950 , 61  yrs. old GENDER: Female ENDOSCOPIST: R.  Roetta Sessions, MD FACP FACG REFERRED BY:  Assunta Found, M.D.  De Blanch, M.D.  Glenford Peers, M.D. PROCEDURE DATE:  05/26/2012 PROCEDURE:     Colonoscopy with biopsy  INDICATIONS: First ever screening colonoscopy; history of ovarian cancer; history partial colonic resection related to GYN-oncology surgery by history  INFORMED CONSENT:  The risks, benefits, alternatives and imponderables including but not limited to bleeding, perforation as well as the possibility of a missed lesion have been reviewed.  The potential for biopsy, lesion removal, etc. have also been discussed.  Questions have been answered.  All parties agreeable. Please see the history and physical in the medical record for more information.  MEDICATIONS: Versed 7 mg IV and Demerol 100 mg IV in divided doses. Zofran 4 mg IV  DESCRIPTION OF PROCEDURE:  After a digital rectal exam was performed, the EC-3890Li ([id number])  colonoscope was advanced from the anus through the rectum and colon to the area of the cecum, ileocecal valve and appendiceal orifice.  The cecum was deeply intubated.  These structures were well-seen and photographed for the record.  From the level of the cecum and ileocecal valve, the scope was slowly and cautiously withdrawn.  The mucosal surfaces were carefully surveyed utilizing scope tip deflection to facilitate fold flattening as needed.  The scope was pulled down into the rectum where a thorough examination including retroflexion was performed.    FINDINGS:  Adequate preparation. Normal rectum., Scattered left-sided diverticula. (1) diminutive polyp in the mid descending segment; 3 tiny nodules with central depression in the base of the cecum- as shown in the  above photograph. Otherwise, the remainder of the colonic mucosa appeared totally normal. Unable to identify a colonic anastomosis.  THERAPEUTIC / DIAGNOSTIC MANEUVERS PERFORMED:  The above-mentioned polyp was cold biopsied/removed. The nodules in the cecum were biopsied.  COMPLICATIONS: None  CECAL WITHDRAWAL TIME:  8 minutes  IMPRESSION:  Colonic diverticulosis. Colonic polyp-removed as described above. Abnormal cecum of doubtful clinical significance-status post biopsy.  RECOMMENDATIONS: Followup on pathology. Further recommendations to follow.   _______________________________ eSigned:  R. Roetta Sessions, MD FACP [facility] 05/26/2012 1:34 PM   CC:

## 2012-05-27 ENCOUNTER — Encounter: Payer: Self-pay | Admitting: Internal Medicine

## 2012-05-27 ENCOUNTER — Encounter (HOSPITAL_COMMUNITY): Payer: Self-pay | Admitting: Internal Medicine

## 2012-06-21 ENCOUNTER — Encounter (HOSPITAL_COMMUNITY): Payer: BC Managed Care – PPO | Attending: Oncology

## 2012-06-21 DIAGNOSIS — Z452 Encounter for adjustment and management of vascular access device: Secondary | ICD-10-CM

## 2012-06-21 DIAGNOSIS — C569 Malignant neoplasm of unspecified ovary: Secondary | ICD-10-CM | POA: Insufficient documentation

## 2012-06-21 DIAGNOSIS — Z95828 Presence of other vascular implants and grafts: Secondary | ICD-10-CM

## 2012-06-21 MED ORDER — SODIUM CHLORIDE 0.9 % IJ SOLN
10.0000 mL | INTRAMUSCULAR | Status: DC | PRN
  Administered 2012-06-21: 10 mL via INTRAVENOUS
  Filled 2012-06-21: qty 10

## 2012-06-21 MED ORDER — HEPARIN SOD (PORK) LOCK FLUSH 100 UNIT/ML IV SOLN
500.0000 [IU] | Freq: Once | INTRAVENOUS | Status: AC
  Administered 2012-06-21: 500 [IU] via INTRAVENOUS
  Filled 2012-06-21: qty 5

## 2012-06-21 NOTE — Progress Notes (Signed)
Annette Clark presented for Portacath access and flush. Proper placement of portacath confirmed by CXR. Portacath located right chest wall accessed with  H 20 needle. Good blood return present. Portacath flushed with 20ml NS and 500U/5ml Heparin and needle removed intact. Procedure without incident. Patient tolerated procedure well.   

## 2012-08-16 ENCOUNTER — Encounter (HOSPITAL_COMMUNITY): Payer: BC Managed Care – PPO | Attending: Oncology

## 2012-08-16 DIAGNOSIS — C569 Malignant neoplasm of unspecified ovary: Secondary | ICD-10-CM

## 2012-08-16 DIAGNOSIS — Z452 Encounter for adjustment and management of vascular access device: Secondary | ICD-10-CM

## 2012-08-16 MED ORDER — HEPARIN SOD (PORK) LOCK FLUSH 100 UNIT/ML IV SOLN
INTRAVENOUS | Status: AC
  Filled 2012-08-16: qty 5

## 2012-08-16 MED ORDER — SODIUM CHLORIDE 0.9 % IJ SOLN
10.0000 mL | INTRAMUSCULAR | Status: DC | PRN
  Administered 2012-08-16: 10 mL via INTRAVENOUS
  Filled 2012-08-16: qty 10

## 2012-08-16 MED ORDER — HEPARIN SOD (PORK) LOCK FLUSH 100 UNIT/ML IV SOLN
500.0000 [IU] | Freq: Once | INTRAVENOUS | Status: AC
  Administered 2012-08-16: 500 [IU] via INTRAVENOUS
  Filled 2012-08-16: qty 5

## 2012-08-16 NOTE — Progress Notes (Signed)
Annette Clark presented for Portacath access and flush. Portacath located lt chest wall accessed with  H 20 needle. Good blood return present. Portacath flushed with 20ml NS and 500U/68ml Heparin and needle removed intact. Procedure without incident. Patient tolerated procedure well.

## 2012-10-06 ENCOUNTER — Other Ambulatory Visit (HOSPITAL_COMMUNITY): Payer: Self-pay | Admitting: Family Medicine

## 2012-10-06 DIAGNOSIS — Z139 Encounter for screening, unspecified: Secondary | ICD-10-CM

## 2012-10-11 ENCOUNTER — Encounter (HOSPITAL_COMMUNITY): Payer: BC Managed Care – PPO | Attending: Oncology

## 2012-10-11 DIAGNOSIS — Z452 Encounter for adjustment and management of vascular access device: Secondary | ICD-10-CM

## 2012-10-11 DIAGNOSIS — C569 Malignant neoplasm of unspecified ovary: Secondary | ICD-10-CM

## 2012-10-11 MED ORDER — HEPARIN SOD (PORK) LOCK FLUSH 100 UNIT/ML IV SOLN
INTRAVENOUS | Status: AC
  Filled 2012-10-11: qty 5

## 2012-10-11 MED ORDER — HEPARIN SOD (PORK) LOCK FLUSH 100 UNIT/ML IV SOLN
500.0000 [IU] | Freq: Once | INTRAVENOUS | Status: AC
  Administered 2012-10-11: 500 [IU] via INTRAVENOUS

## 2012-10-11 MED ORDER — SODIUM CHLORIDE 0.9 % IJ SOLN
10.0000 mL | INTRAMUSCULAR | Status: DC | PRN
  Administered 2012-10-11: 10 mL via INTRAVENOUS

## 2012-10-11 NOTE — Progress Notes (Signed)
Annette Clark presented for Portacath access and flush.  Proper placement of portacath confirmed by CXR.  Portacath located left chest wall accessed with  H 20 needle.  Good blood return present. Portacath flushed with 20ml NS and 500U/41ml Heparin and needle removed intact.  Procedure tolerated well and without incident.

## 2012-10-26 ENCOUNTER — Encounter (HOSPITAL_COMMUNITY): Payer: BC Managed Care – PPO | Attending: Hematology and Oncology

## 2012-10-26 ENCOUNTER — Encounter (HOSPITAL_COMMUNITY): Payer: Self-pay

## 2012-10-26 VITALS — BP 125/80 | HR 76 | Temp 98.5°F | Resp 20 | Wt 300.0 lb

## 2012-10-26 DIAGNOSIS — C569 Malignant neoplasm of unspecified ovary: Secondary | ICD-10-CM | POA: Insufficient documentation

## 2012-10-26 DIAGNOSIS — Z8543 Personal history of malignant neoplasm of ovary: Secondary | ICD-10-CM

## 2012-10-26 DIAGNOSIS — Z86711 Personal history of pulmonary embolism: Secondary | ICD-10-CM

## 2012-10-26 LAB — CBC WITH DIFFERENTIAL/PLATELET
Basophils Absolute: 0.1 10*3/uL (ref 0.0–0.1)
Basophils Relative: 1 % (ref 0–1)
Eosinophils Absolute: 0.2 10*3/uL (ref 0.0–0.7)
MCH: 28.7 pg (ref 26.0–34.0)
MCHC: 32.9 g/dL (ref 30.0–36.0)
Monocytes Relative: 8 % (ref 3–12)
Neutro Abs: 2.5 10*3/uL (ref 1.7–7.7)
Neutrophils Relative %: 44 % (ref 43–77)
RDW: 13.3 % (ref 11.5–15.5)

## 2012-10-26 LAB — COMPREHENSIVE METABOLIC PANEL
AST: 28 U/L (ref 0–37)
Albumin: 3.8 g/dL (ref 3.5–5.2)
Alkaline Phosphatase: 114 U/L (ref 39–117)
BUN: 16 mg/dL (ref 6–23)
Chloride: 100 mEq/L (ref 96–112)
Creatinine, Ser: 0.58 mg/dL (ref 0.50–1.10)
Potassium: 4.2 mEq/L (ref 3.5–5.1)
Total Bilirubin: 0.4 mg/dL (ref 0.3–1.2)
Total Protein: 7.8 g/dL (ref 6.0–8.3)
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
## 2012-10-26 NOTE — Patient Instructions (Signed)
[facility] Discharge Instructions  RECOMMENDATIONS MADE BY THE CONSULTANT AND ANY TEST RESULTS WILL BE SENT TO YOUR REFERRING PHYSICIAN.  EXAM FINDINGS BY THE PHYSICIAN TODAY AND SIGNS OR SYMPTOMS TO REPORT TO CLINIC OR PRIMARY PHYSICIAN:   Return in 1 year to see MD & for labs  Don't take Effexor  Port flush every 8 weeks   Thank you for choosing [facility] to provide your oncology and hematology care.  To afford each patient quality time with our providers, please arrive at least 15 minutes before your scheduled appointment time.  With your help, our goal is to use those 15 minutes to complete the necessary work-up to ensure our physicians have the information they need to help with your evaluation and healthcare recommendations.    Effective January 1st, 2014, we ask that you re-schedule your appointment with our physicians should you arrive 10 or more minutes late for your appointment.  We strive to give you quality time with our providers, and arriving late affects you and other patients whose appointments are after yours.    Again, thank you for choosing [facility].  Our hope is that these requests will decrease the amount of time that you wait before being seen by our physicians.       _____________________________________________________________  Should you have questions after your visit to [facility], please contact our office at [phone] between the hours of 8:30 a.m. and 5:00 p.m.  Voicemails left after 4:30 p.m. will not be returned until the following business day.  For prescription refill requests, have your pharmacy contact our office with your prescription refill request.

## 2012-10-27 LAB — CA 125: CA 125: 6.8 U/mL (ref 0.0–30.2)

## 2012-11-03 ENCOUNTER — Ambulatory Visit (HOSPITAL_COMMUNITY): Payer: BC Managed Care – PPO

## 2012-11-17 ENCOUNTER — Ambulatory Visit (HOSPITAL_COMMUNITY)
Admission: RE | Admit: 2012-11-17 | Discharge: 2012-11-17 | Disposition: A | Discharge status: Home / Self Care | Payer: BC Managed Care – PPO | Source: Ambulatory Visit | Attending: Family Medicine | Admitting: Family Medicine

## 2012-11-17 DIAGNOSIS — Z1231 Encounter for screening mammogram for malignant neoplasm of breast: Secondary | ICD-10-CM | POA: Insufficient documentation

## 2012-11-17 DIAGNOSIS — Z139 Encounter for screening, unspecified: Secondary | ICD-10-CM

## 2012-11-24 ENCOUNTER — Other Ambulatory Visit: Payer: Self-pay

## 2012-12-06 ENCOUNTER — Encounter (HOSPITAL_COMMUNITY): Payer: BC Managed Care – PPO | Attending: Hematology and Oncology

## 2012-12-06 DIAGNOSIS — Z452 Encounter for adjustment and management of vascular access device: Secondary | ICD-10-CM

## 2012-12-06 DIAGNOSIS — C569 Malignant neoplasm of unspecified ovary: Secondary | ICD-10-CM

## 2012-12-06 MED ORDER — HEPARIN SOD (PORK) LOCK FLUSH 100 UNIT/ML IV SOLN
500.0000 [IU] | Freq: Once | INTRAVENOUS | Status: AC
  Administered 2012-12-06: 500 [IU] via INTRAVENOUS

## 2012-12-06 MED ORDER — SODIUM CHLORIDE 0.9 % IJ SOLN
10.0000 mL | INTRAMUSCULAR | Status: DC | PRN
  Administered 2012-12-06: 10 mL via INTRAVENOUS

## 2012-12-06 MED ORDER — HEPARIN SOD (PORK) LOCK FLUSH 100 UNIT/ML IV SOLN
INTRAVENOUS | Status: AC
  Filled 2012-12-06: qty 5

## 2012-12-06 NOTE — Progress Notes (Signed)
Annette Clark presented for Portacath access and flush.  Proper placement of portacath confirmed by CXR.  Portacath located right chest wall accessed with  H 20 needle.  Good blood return present. Portacath flushed with 20ml NS and 500U/5ml Heparin and needle removed intact.  Procedure tolerated well and without incident.    

## 2013-01-17 ENCOUNTER — Encounter (HOSPITAL_COMMUNITY): Payer: BC Managed Care – PPO

## 2013-01-17 ENCOUNTER — Encounter (HOSPITAL_COMMUNITY): Payer: BC Managed Care – PPO | Attending: Hematology and Oncology

## 2013-01-17 DIAGNOSIS — Z452 Encounter for adjustment and management of vascular access device: Secondary | ICD-10-CM

## 2013-01-17 DIAGNOSIS — C569 Malignant neoplasm of unspecified ovary: Secondary | ICD-10-CM

## 2013-01-17 MED ORDER — SODIUM CHLORIDE 0.9 % IJ SOLN
10.0000 mL | INTRAMUSCULAR | Status: DC | PRN
  Administered 2013-01-17: 10 mL via INTRAVENOUS

## 2013-01-17 MED ORDER — HEPARIN SOD (PORK) LOCK FLUSH 100 UNIT/ML IV SOLN
500.0000 [IU] | Freq: Once | INTRAVENOUS | Status: AC
  Administered 2013-01-17: 500 [IU] via INTRAVENOUS
  Filled 2013-01-17: qty 5

## 2013-01-17 NOTE — Progress Notes (Signed)
Annette Clark presented for Portacath access and flush.  Proper placement of portacath confirmed by CXR.  Portacath located right chest wall accessed with  H 20 needle.  Good blood return present. Portacath flushed with 20ml NS and 500U/46ml Heparin and needle removed intact.  Procedure tolerated well and without incident.

## 2013-01-19 DIAGNOSIS — K56609 Unspecified intestinal obstruction, unspecified as to partial versus complete obstruction: Secondary | ICD-10-CM

## 2013-01-19 HISTORY — DX: Unspecified intestinal obstruction, unspecified as to partial versus complete obstruction: K56.609

## 2013-02-04 ENCOUNTER — Inpatient Hospital Stay (HOSPITAL_COMMUNITY)
Admission: EM | Admit: 2013-02-04 | Discharge: 2013-02-09 | DRG: 389 | Disposition: A | Discharge status: Home / Self Care | Payer: BC Managed Care – PPO | Attending: Internal Medicine | Admitting: Internal Medicine

## 2013-02-04 ENCOUNTER — Emergency Department (HOSPITAL_COMMUNITY): Payer: BC Managed Care – PPO

## 2013-02-04 ENCOUNTER — Encounter (HOSPITAL_COMMUNITY): Payer: Self-pay | Admitting: *Deleted

## 2013-02-04 DIAGNOSIS — E669 Obesity, unspecified: Secondary | ICD-10-CM | POA: Diagnosis present

## 2013-02-04 DIAGNOSIS — R109 Unspecified abdominal pain: Secondary | ICD-10-CM | POA: Diagnosis present

## 2013-02-04 DIAGNOSIS — Z8543 Personal history of malignant neoplasm of ovary: Secondary | ICD-10-CM

## 2013-02-04 DIAGNOSIS — Z90710 Acquired absence of both cervix and uterus: Secondary | ICD-10-CM

## 2013-02-04 DIAGNOSIS — Z9049 Acquired absence of other specified parts of digestive tract: Secondary | ICD-10-CM

## 2013-02-04 DIAGNOSIS — K56609 Unspecified intestinal obstruction, unspecified as to partial versus complete obstruction: Principal | ICD-10-CM | POA: Diagnosis present

## 2013-02-04 DIAGNOSIS — Z87891 Personal history of nicotine dependence: Secondary | ICD-10-CM

## 2013-02-04 DIAGNOSIS — I10 Essential (primary) hypertension: Secondary | ICD-10-CM | POA: Diagnosis present

## 2013-02-04 DIAGNOSIS — Z86711 Personal history of pulmonary embolism: Secondary | ICD-10-CM

## 2013-02-04 DIAGNOSIS — C569 Malignant neoplasm of unspecified ovary: Secondary | ICD-10-CM

## 2013-02-04 DIAGNOSIS — N390 Urinary tract infection, site not specified: Secondary | ICD-10-CM | POA: Diagnosis present

## 2013-02-04 DIAGNOSIS — Z6841 Body Mass Index (BMI) 40.0 and over, adult: Secondary | ICD-10-CM

## 2013-02-04 LAB — URINALYSIS, ROUTINE W REFLEX MICROSCOPIC
Bilirubin Urine: NEGATIVE
Glucose, UA: NEGATIVE mg/dL
HGB URINE DIPSTICK: NEGATIVE
KETONES UR: 15 mg/dL — AB
Leukocytes, UA: NEGATIVE
Nitrite: POSITIVE — AB
PROTEIN: NEGATIVE mg/dL
Specific Gravity, Urine: 1.02 (ref 1.005–1.030)
Urobilinogen, UA: 0.2 mg/dL (ref 0.0–1.0)
pH: 5 (ref 5.0–8.0)

## 2013-02-04 LAB — COMPREHENSIVE METABOLIC PANEL
ALT: 23 U/L (ref 0–35)
AST: 25 U/L (ref 0–37)
Albumin: 4 g/dL (ref 3.5–5.2)
Alkaline Phosphatase: 103 U/L (ref 39–117)
BUN: 14 mg/dL (ref 6–23)
CO2: 25 meq/L (ref 19–32)
CREATININE: 0.6 mg/dL (ref 0.50–1.10)
Calcium: 9.1 mg/dL (ref 8.4–10.5)
Chloride: 98 mEq/L (ref 96–112)
GLUCOSE: 137 mg/dL — AB (ref 70–99)
Potassium: 3.7 mEq/L (ref 3.7–5.3)
Sodium: 138 mEq/L (ref 137–147)
Total Bilirubin: 0.5 mg/dL (ref 0.3–1.2)
Total Protein: 7.6 g/dL (ref 6.0–8.3)

## 2013-02-04 LAB — CBC WITH DIFFERENTIAL/PLATELET
BASOS ABS: 0 10*3/uL (ref 0.0–0.1)
BASOS PCT: 1 % (ref 0–1)
EOS ABS: 0.1 10*3/uL (ref 0.0–0.7)
Eosinophils Relative: 1 % (ref 0–5)
HCT: 41.1 % (ref 36.0–46.0)
Hemoglobin: 13.7 g/dL (ref 12.0–15.0)
Lymphocytes Relative: 21 % (ref 12–46)
Lymphs Abs: 1.5 10*3/uL (ref 0.7–4.0)
MCH: 29.2 pg (ref 26.0–34.0)
MCHC: 33.3 g/dL (ref 30.0–36.0)
MCV: 87.6 fL (ref 78.0–100.0)
Monocytes Absolute: 0.4 10*3/uL (ref 0.1–1.0)
Monocytes Relative: 5 % (ref 3–12)
NEUTROS PCT: 72 % (ref 43–77)
Neutro Abs: 5.2 10*3/uL (ref 1.7–7.7)
PLATELETS: 208 10*3/uL (ref 150–400)
RBC: 4.69 MIL/uL (ref 3.87–5.11)
RDW: 13.6 % (ref 11.5–15.5)
WBC: 7.3 10*3/uL (ref 4.0–10.5)

## 2013-02-04 LAB — LIPASE, BLOOD: Lipase: 28 U/L (ref 11–59)

## 2013-02-04 LAB — URINE MICROSCOPIC-ADD ON

## 2013-02-04 MED ORDER — SODIUM CHLORIDE 0.9 % IV SOLN
INTRAVENOUS | Status: AC
  Administered 2013-02-04: 16:00:00 via INTRAVENOUS

## 2013-02-04 MED ORDER — ASPIRIN EC 81 MG PO TBEC
81.0000 mg | DELAYED_RELEASE_TABLET | Freq: Every day | ORAL | Status: DC
  Administered 2013-02-05 – 2013-02-09 (×5): 81 mg via ORAL
  Filled 2013-02-04 (×5): qty 1

## 2013-02-04 MED ORDER — ONDANSETRON HCL 4 MG PO TABS: 4.0000 mg | ORAL_TABLET | Freq: Four times a day (QID) | ORAL | Status: DC | PRN

## 2013-02-04 MED ORDER — IOHEXOL 300 MG/ML  SOLN
50.0000 mL | Freq: Once | INTRAMUSCULAR | Status: AC | PRN
Start: 2013-02-04 — End: 2013-02-04
  Administered 2013-02-04: 50 mL via ORAL

## 2013-02-04 MED ORDER — MORPHINE SULFATE 4 MG/ML IJ SOLN
4.0000 mg | Freq: Once | INTRAMUSCULAR | Status: AC
  Administered 2013-02-04: 4 mg via INTRAVENOUS
  Filled 2013-02-04: qty 1

## 2013-02-04 MED ORDER — ONDANSETRON HCL 4 MG/2ML IJ SOLN
4.0000 mg | Freq: Four times a day (QID) | INTRAMUSCULAR | Status: DC | PRN
  Administered 2013-02-04 – 2013-02-05 (×3): 4 mg via INTRAVENOUS
  Filled 2013-02-04 (×2): qty 2

## 2013-02-04 MED ORDER — ONDANSETRON HCL 4 MG/2ML IJ SOLN
4.0000 mg | Freq: Once | INTRAMUSCULAR | Status: AC
  Administered 2013-02-04: 4 mg via INTRAVENOUS
  Filled 2013-02-04: qty 2

## 2013-02-04 MED ORDER — MORPHINE SULFATE 2 MG/ML IJ SOLN
2.0000 mg | INTRAMUSCULAR | Status: DC | PRN
  Administered 2013-02-04 – 2013-02-06 (×8): 2 mg via INTRAVENOUS
  Filled 2013-02-04 (×9): qty 1

## 2013-02-04 MED ORDER — IOHEXOL 300 MG/ML  SOLN
100.0000 mL | Freq: Once | INTRAMUSCULAR | Status: AC | PRN
  Administered 2013-02-04: 100 mL via INTRAVENOUS

## 2013-02-04 MED ORDER — ACETAMINOPHEN 650 MG RE SUPP: 650.0000 mg | Freq: Four times a day (QID) | RECTAL | Status: DC | PRN

## 2013-02-04 MED ORDER — BISACODYL 10 MG RE SUPP: 10.0000 mg | Freq: Every day | RECTAL | Status: DC | PRN

## 2013-02-04 MED ORDER — HYDRALAZINE HCL 20 MG/ML IJ SOLN: 10.0000 mg | Freq: Four times a day (QID) | INTRAMUSCULAR | Status: DC | PRN

## 2013-02-04 MED ORDER — LEVOFLOXACIN IN D5W 500 MG/100ML IV SOLN
INTRAVENOUS | Status: AC
  Filled 2013-02-04: qty 100

## 2013-02-04 MED ORDER — LEVOFLOXACIN IN D5W 500 MG/100ML IV SOLN
500.0000 mg | INTRAVENOUS | Status: DC
  Administered 2013-02-04 – 2013-02-07 (×4): 500 mg via INTRAVENOUS
  Filled 2013-02-04 (×5): qty 100

## 2013-02-04 MED ORDER — SODIUM CHLORIDE 0.9 % IV BOLUS (SEPSIS)
1000.0000 mL | Freq: Once | INTRAVENOUS | Status: AC
  Administered 2013-02-04: 1000 mL via INTRAVENOUS

## 2013-02-04 MED ORDER — LORAZEPAM 2 MG/ML IJ SOLN
0.5000 mg | Freq: Once | INTRAMUSCULAR | Status: AC
  Administered 2013-02-04: 0.5 mg via INTRAVENOUS
  Filled 2013-02-04: qty 1

## 2013-02-04 MED ORDER — ONDANSETRON HCL 4 MG/2ML IJ SOLN
INTRAMUSCULAR | Status: AC
  Filled 2013-02-04: qty 2

## 2013-02-04 MED ORDER — ENOXAPARIN SODIUM 40 MG/0.4ML ~~LOC~~ SOLN
40.0000 mg | SUBCUTANEOUS | Status: DC
  Administered 2013-02-04 – 2013-02-08 (×5): 40 mg via SUBCUTANEOUS
  Filled 2013-02-04 (×5): qty 0.4

## 2013-02-04 MED ORDER — ACETAMINOPHEN 325 MG PO TABS: 650.0000 mg | ORAL_TABLET | Freq: Four times a day (QID) | ORAL | Status: DC | PRN

## 2013-02-04 NOTE — ED Notes (Signed)
Pt evaluated by hospitalists

## 2013-02-04 NOTE — ED Notes (Signed)
Pt states she has been belching a lot. Also, complain of belly pain. 4/10. EDP aware

## 2013-02-04 NOTE — Progress Notes (Signed)
02/04/13 1931 Patient has general surgery consult ordered. Patient requested Dr. Romona Curls, stated "he has done my other surgeries". Dr. Romona Curls paged to notify of patient request per nursing supervisor instruction. Received return call from Dr. Romona Curls. Stated he was unable to take consult, would need to notify surgeon on call. Consult for Dr. Arnoldo Morale placed in consult log. Nursing secretary notified to place consult. Donavan Foil, RN
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
## 2013-02-04 NOTE — ED Notes (Signed)
Advised patient we still needed to obtain urine specimen.  She stated she would ring the call bell when she could go to the bathroom.

## 2013-02-04 NOTE — ED Notes (Signed)
C/o mid abdominal pain onset 0300 today; c/o nausea; denies vomiting/diarrhea.
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
## 2013-02-05 LAB — CBC
HCT: 35.7 % — ABNORMAL LOW (ref 36.0–46.0)
HEMOGLOBIN: 11.9 g/dL — AB (ref 12.0–15.0)
MCH: 29.4 pg (ref 26.0–34.0)
MCHC: 33.3 g/dL (ref 30.0–36.0)
MCV: 88.1 fL (ref 78.0–100.0)
Platelets: 170 10*3/uL (ref 150–400)
RBC: 4.05 MIL/uL (ref 3.87–5.11)
RDW: 13.5 % (ref 11.5–15.5)
WBC: 6.7 10*3/uL (ref 4.0–10.5)

## 2013-02-05 LAB — BASIC METABOLIC PANEL
BUN: 10 mg/dL (ref 6–23)
CO2: 29 meq/L (ref 19–32)
Calcium: 8.2 mg/dL — ABNORMAL LOW (ref 8.4–10.5)
Chloride: 101 mEq/L (ref 96–112)
Creatinine, Ser: 0.65 mg/dL (ref 0.50–1.10)
GFR calc Af Amer: >90 mL/min (ref >90)
Glucose, Bld: 112 mg/dL — ABNORMAL HIGH (ref 70–99)
POTASSIUM: 3.8 meq/L (ref 3.7–5.3)
SODIUM: 139 meq/L (ref 137–147)

## 2013-02-05 MED ORDER — ONDANSETRON HCL 4 MG/2ML IJ SOLN
4.0000 mg | Freq: Four times a day (QID) | INTRAMUSCULAR | Status: DC | PRN
  Administered 2013-02-05: 4 mg via INTRAVENOUS
  Filled 2013-02-05: qty 2

## 2013-02-05 MED ORDER — SODIUM CHLORIDE 0.9 % IV SOLN
INTRAVENOUS | Status: DC
  Administered 2013-02-05: 20:00:00 via INTRAVENOUS
  Administered 2013-02-05: 1000 mL via INTRAVENOUS
  Administered 2013-02-05 – 2013-02-07 (×4): via INTRAVENOUS

## 2013-02-05 MED ORDER — ONDANSETRON HCL 4 MG PO TABS: 4.0000 mg | ORAL_TABLET | ORAL | Status: DC | PRN

## 2013-02-05 NOTE — Consult Note (Signed)
NAMEDESTENI, PISCOPO                ACCOUNT NO.:  [id number]  MEDICAL RECORD NO.:  [id number]  LOCATION:  F093                          FACILITY:  [facility]  PHYSICIAN:  Felicie Morn, M.D. DATE OF BIRTH:  1950/06/02  DATE OF CONSULTATION:  02/05/2013 DATE OF DISCHARGE:                                CONSULTATION   NOTE:  Surgery was asked to see this 63 year old white female for a bowel obstruction.  HISTORY OF PRESENT MEDICAL ILLNESS:  Yesterday at 3:00 a.m., she awoke with crampy abdominal pain with nausea and bloating and vomiting.  She came to the emergency room and was subsequently admitted to the hospital service with presumptive partial small bowel obstruction.  Surgery was consulted.  Initially, the patient was to be placed on Dr. Arnoldo Morale service, however, I will be covering for him starting tomorrow and the patient requested me as a consultation and I discussed this with Dr. Arnoldo Morale and I am formally assuming her surgical care.  PHYSICAL EXAMINATION:  VITAL SIGNS:  She is 5 feet 5 inches approximately and weighs 300 pounds.  Her blood pressure is 137/105, pulse is 87 and regular, temperature is 98.7, respirations are 20. HEENT:  Head is normocephalic.  Eyes, extraocular movements are intact. Pupils are round and reactive to light and accommodation.  There is no conjunctival pallor or scleral injection.  Sclera has a normal tincture. Nose and oral mucosa are moist. NECK:  Without jugular vein distention, thyromegaly, or cervical adenopathy and no bruits are appreciated. CHEST:  Lungs are clear both to anterior and posterior auscultation. BREASTS AND AXILLAE:  Without masses. HEART:  Regular rhythm. ABDOMEN:  The patient has a very globus abdomen.  Bowel sounds are not increased.  The patient has some tenderness across her abdomen below the umbilicus.  No femoral or inguinal hernias are appreciated.  Stool is guaiac-negative. EXTREMITIES:  Pulses are symmetrical.  The  patient has no obvious edema, although she is quite large.  She has had a previous open reduction internal fixation of her left ankle in the past.  REVIEW OF SYSTEMS:  GI:  The patient presented with crampy abdominal pain, which has improved since she has been in the hospital.  She has recently passed a great deal of flatus as she says and she feels better. Prior to that, she gave a history of nausea and vomiting and crampy abdominal pain.  This is abated over the last 12 hours or so.  Her last bowel movement was on the 16th and she has stated is now passing flatus, and does not feel bloated or in any kind of pain.  She has no past history of hepatitis.  She has no past history of irritable bowel syndrome or inflammatory bowel disease.  She has had a colonoscopy in 2014.  She has no history of bright red rectal bleeding or melena or change in the caliber of her stools.  OB/GYN:  She is a nulliparous female who has a past history of ovarian cancer.  This was operated on the 9 years ago by Dr. Fermin Schwab during that surgery apparently there was a small bowel resection as well as a radical hysterectomy and retroperitoneal lymph  node dissection.  She also was treated with chemotherapy for this.  She has no fat past history of cancer of the breast in her family.  She had a mammogram in 2014 and this was negative.  GU:  No history of dysuria or nephrolithiasis.  ENDOCRINE: No history of diabetes, thyroid disease, or adrenal problems. MUSCULOSKELETAL SYSTEM:  The patient is quite obese and has had ankle fractures on both sides.  One side required an open reduction internal fixation, approximately 15 years ago.  NEUROLOGICAL:  There are no neurological lateralizing symptoms.  No history of seizures or migraines.  CARDIOPULMONARY:  The patient is a nonsmoker and nondrinker. She had apparently a pulmonary embolus in the past, this was perioperatively I gather from around her previous surgery.   She has no chest pain or shortness of breath at present.  She has hypertension.  REVIEW OF HISTORY AND PHYSICAL:  Therefore, Ms. Mabe is a 63 year old white female who has signs and symptoms of partial small bowel obstruction.  This apparently is improving as she has begun to pass flatus.  I reviewed acute abdominal series done yesterday and CT scan, there is an area of narrowing and dilatation in the mid jejunal area and likely around the area of that small bowel anastomosis as mentioned above.  Laboratory data otherwise is within normal limits.  Electrolytes are within normal limits.  PLAN:  She will continue on the hospitalist's service and I will follow her from a surgical standpoint.  I have discussed the need for a follow up with acute abdominal series in the morning.  I have told them to consult me should she have recurrent symptoms of bloating and nausea, and to place an NG tube at that time, and I will continue to follow her from a surgical point of view.     Felicie Morn, M.D.     WB/MEDQ  D:  02/05/2013  T:  02/05/2013  Job:  [id number]  cc:   Dr. Arnoldo Morale

## 2013-02-05 NOTE — Progress Notes (Signed)
Patient has been ambulating halls frequently today, tolerating well.  Passing a moderate amount of gas and having 3-4/10 abdominal discomfort which is helped with PRN meds

## 2013-02-05 NOTE — Progress Notes (Signed)
Filed Vitals:   02/05/13 1500  BP: 108/58  Pulse: 70  Temp: 98.3 F (36.8 C)  Resp: 18   Abdomen remains soft and is actually completely non tender.  Pt. Continues to pass flatus and is clinically improving.  Will check films in AM.  I think her obstruction is partial and is resolving. Will continue observation.  No acute surgical changes noted.

## 2013-02-05 NOTE — Progress Notes (Signed)
[facility] PROGRESS NOTE  MARIFER HURD DPO:[id number] DOB: 1950-11-02 DOA: 02/04/2013 PCP: [name], MD  Assessment/Plan: Principal Problem:  Abdominal pain  Active Problems:  SBO (small bowel obstruction)  HTN (hypertension)   63 y.o. female with PMH of HTN, h/o ovarian CA s/p surgery, chemo in the past presented with diffuse non radiating abdominal pian associated with nausea, no vomiting; last BM 1/16; since no flatus; ED/CT showed SBO   1. Abdominal pain, SBO; exam no s/s of acute abdomen;  -no significant improvement,. No BM, no flatus; vomited x 2; place  NG; NPO, IVF, pain control, antiemetics, c/s surgery   2. HTN, hold ACE; prn hydralazine   3. Probable UTI; start atx, pend urine culture    Surgery; if consultant consulted, please document name and whether formally or informally consulted   Code Status: full Family Communication: d/w patient, her family  (indicate person spoken with, relationship, and if by phone, the number) Disposition Plan: home when ready    Consultants:  Surgery   Procedures:  None   Antibiotics:  Levofloxacin 1/17<<<   (indicate start date, and stop date if known)  HPI/Subjective: alert  Objective: Filed Vitals:   02/05/13 0250  BP: 137/105  Pulse: 87  Temp: 98.7 F (37.1 C)  Resp: 20    Intake/Output Summary (Last 24 hours) at 02/05/13 0904 Last data filed at 02/04/13 1855  Gross per 24 hour  Intake  462.5 ml  Output      0 ml  Net  462.5 ml   Filed Weights   02/04/13 0934 02/04/13 1708  Weight: 136.079 kg (300 lb) 137.9 kg (304 lb 0.2 oz)    Exam:   General:  alert  Cardiovascular: s1,s2 rrr  Respiratory: CTA BL  Abdomen: soft, obese, mild distended, mild tender diffuse  Musculoskeletal: no le edema   Data Reviewed: Basic Metabolic Panel:  Recent Labs Lab 02/04/13 0950 02/05/13 0647  NA 138 139  K 3.7 3.8  CL 98 101  CO2 25 29  GLUCOSE 137* 112*  BUN 14 10  CREATININE  0.60 0.65  CALCIUM 9.1 8.2*   Liver Function Tests:  Recent Labs Lab 02/04/13 0950  AST 25  ALT 23  ALKPHOS 103  BILITOT 0.5  PROT 7.6  ALBUMIN 4.0    Recent Labs Lab 02/04/13 0950  LIPASE 28   No results found for this basename: AMMONIA,  in the last 168 hours CBC:  Recent Labs Lab 02/04/13 0950 02/05/13 0647  WBC 7.3 6.7  NEUTROABS 5.2  --   HGB 13.7 11.9*  HCT 41.1 35.7*  MCV 87.6 88.1  PLT 208 170   Cardiac Enzymes: No results found for this basename: CKTOTAL, CKMB, CKMBINDEX, TROPONINI,  in the last 168 hours BNP (last 3 results) No results found for this basename: PROBNP,  in the last 8760 hours CBG: No results found for this basename: GLUCAP,  in the last 168 hours  No results found for this or any previous visit (from the past 240 hour(s)).   Studies: Ct Abdomen Pelvis W Contrast  02/04/2013   CLINICAL DATA:  Mid abdominal pain with nausea and vomiting. History of ovarian cancer.  EXAM: CT ABDOMEN AND PELVIS WITH CONTRAST  TECHNIQUE: Multidetector CT imaging of the abdomen and pelvis was performed using the standard protocol following bolus administration of intravenous contrast.  CONTRAST:  37mL OMNIPAQUE IOHEXOL 300 MG/ML SOLN, 13mL OMNIPAQUE IOHEXOL 300 MG/ML SOLN  COMPARISON:  Abdominal pelvic CT 09/24/2004.  FINDINGS: The visualized lung bases are clear. There is no significant pleural or pericardial effusion.  The liver, gallbladder, biliary system and pancreas appear normal. The spleen, adrenal glands and kidneys appear normal. There is no hydronephrosis.  The stomach and proximal small bowel appear unremarkable. The mid small bowel is mildly distended with and air feces sign. The distal small bowel is decompressed. The area of transition appears to be is in the infraumbilical region where there are apparent surgical clips. No soft tissue mass is apparent in this area. There is mild mesenteric edema and a small amount of free pelvic fluid. No peritoneal  nodularity or enlarged lymph nodes are demonstrated. The colon appears normal aside from mild diverticulosis distally. There is a small supraumbilical hernia containing fat and a knuckle of the transverse colon. There is no evidence of bowel incarceration.  There is no pelvic mass status post hysterectomy. The bladder appears normal.  There is degenerative disc disease at L5-S1. No worrisome osseous findings are evident.  IMPRESSION: 1. Partial mid to distal small bowel obstruction, apparently at the level of previous surgery in the infraumbilical region, probably a small bowel surgical anastomosis. No extrinsic mass is apparent in this region. 2. Mild mesenteric edema and minimal free pelvic fluid. No specific signs of metastatic disease. 3. Small supraumbilical hernia containing fat and a knuckle of transverse colon. No evidence of bowel incarceration.   Electronically Signed   By: Camie Patience M.D.   On: 02/04/2013 12:56   Dg Abd Acute W/chest  02/04/2013   CLINICAL DATA:  Abdominal pain and nausea, history of ovarian cancer  EXAM: ACUTE ABDOMEN SERIES (ABDOMEN 2 VIEW & CHEST 1 VIEW)  COMPARISON:  04/01/2007  FINDINGS: The heart size and vascular pattern are normal. Lungs are clear. Stable right Port-A-Cath.  No free air. There are dilated loops of small bowel in the upper mid and left abdomen. These measure up to 4 cm in diameter. Gas is seen to the colon and into the rectum.  IMPRESSION: Findings suggest the possibility of low grade partial small bowel obstruction. Recommend radiographic followup.   Electronically Signed   By: Skipper Cliche M.D.   On: 02/04/2013 10:22    Scheduled Meds: . aspirin EC  81 mg Oral Daily  . enoxaparin (LOVENOX) injection  40 mg Subcutaneous Q24H  . levofloxacin (LEVAQUIN) IV  500 mg Intravenous Q24H   Continuous Infusions: . sodium chloride 1,000 mL (02/05/13 0203)    Principal Problem:   Abdominal pain Active Problems:   SBO (small bowel obstruction)   HTN  (hypertension)   Small bowel obstruction    Time spent: >35 minutes     Kinnie Feil  [facility] Pager [phone]. If 7PM-7AM, please contact night-coverage at [email or web address], password [facility] 02/05/2013, 9:04 AM  LOS: 1 day

## 2013-02-05 NOTE — Progress Notes (Signed)
Surgery  Filed Vitals:   02/05/13 0250  BP: 137/105  Pulse: 87  Temp: 98.7 F (37.1 C)  Resp: 20   In essence, 22 yr. Old W. Obese female with signs and symptoms of partial small bowel obstruction likely  Stemming from previous radical hysterectomy and SB resection 9 yrs ago.    Clinically pt is improving since initial presentation 3:00 AM 1/17.  Her abdomen is softly tender and bowel sounds are not increased.  She has no inguinal hernias but has a non incarcerated umbilical hernia seen on CT scan.  This area is non tender.  Pt is quite obese making PE problematic but clinically she is improving by history and and she is certainly in no acute distress. Stool is guaiac neg.  Have discussed with hospitalitis and nursing that we will insert NG tube if symptoms of distention and nausea and vomiting recur. Will continue hydration and non operative approach at this point.  Will follow with med. Service. Repeat Acute abdominal series in AM.

## 2013-02-06 ENCOUNTER — Inpatient Hospital Stay (HOSPITAL_COMMUNITY): Payer: BC Managed Care – PPO

## 2013-02-06 LAB — URINE CULTURE: Colony Count: >=100000

## 2013-02-06 MED ORDER — LORAZEPAM 2 MG/ML IJ SOLN
0.5000 mg | Freq: Once | INTRAMUSCULAR | Status: AC
Start: 2013-02-06 — End: 2013-02-06
  Administered 2013-02-06: 0.5 mg via INTRAVENOUS
  Filled 2013-02-06: qty 1

## 2013-02-06 MED ORDER — ZOLPIDEM TARTRATE 5 MG PO TABS
5.0000 mg | ORAL_TABLET | Freq: Once | ORAL | Status: AC
  Administered 2013-02-06: 5 mg via ORAL
  Filled 2013-02-06: qty 1

## 2013-02-06 NOTE — Care Management Note (Signed)
    Page 1 of 1   02/06/2013     2:13:34 PM   CARE MANAGEMENT NOTE 02/06/2013  Patient:  Annette Clark, Annette Clark   Account Number:  [id number]  Date Initiated:  02/06/2013  Documentation initiated by:  Claretha Cooper  Subjective/Objective Assessment:   Pt admitted from home with spouse. No needs anticipated at this time.     Action/Plan:   Anticipated DC Date:  02/08/2013   Anticipated DC Plan:  HOME/SELF CARE         Choice offered to / List presented to:             Status of service:  Completed, signed off Medicare Important Message given?   (If response is "NO", the following Medicare IM given date fields will be blank) Date Medicare IM given:   Date Additional Medicare IM given:    Discharge Disposition:  CORRECTIONS FACILITY  Per UR Regulation:    If discussed at Long Length of Stay Meetings, dates discussed:    Comments:  02/06/13 Claretha Cooper RN BSN CM

## 2013-02-06 NOTE — Progress Notes (Signed)
Filed Vitals:   02/06/13 1342  BP: 146/61  Pulse: 76  Temp: 99 F (37.2 C)  Resp: 20   Continued improvement, abdomen soft. ASS shows contrast advancing to colon.  Will allow sips of clear liquids.

## 2013-02-06 NOTE — Progress Notes (Signed)
[facility] PROGRESS NOTE  Annette Clark PFX:[id number] DOB: [birth year] DOA: 02/04/2013 PCP: [name], MD  Assessment/Plan: Principal Problem:  Abdominal pain  Active Problems:  SBO (small bowel obstruction)  HTN (hypertension)   63 y.o. female with PMH of HTN, h/o ovarian CA s/p surgery, chemo in the past presented with diffuse non radiating abdominal pian associated with nausea, no vomiting; last BM 1/16; since no flatus; ED/CT showed SBO   1. Abdominal pain, SBO; exam no s/s of acute abdomen;  -clinically improving; + flatus; diet per surgery; cont pain control, antiemetics, appreciate surgery management   2. HTN, hold ACE; prn hydralazine   3. Probable UTI; start atx, pend urine culture    Surgery; if consultant consulted, please document name and whether formally or informally consulted   Code Status: full Family Communication: d/w patient, her family  (indicate person spoken with, relationship, and if by phone, the number) Disposition Plan: home when ready    Consultants:  Surgery   Procedures:  None   Antibiotics:  Levofloxacin 1/17<<<   (indicate start date, and stop date if known)  HPI/Subjective: alert  Objective: Filed Vitals:   02/06/13 0517  BP: 114/75  Pulse: 77  Temp: 98.5 F (36.9 C)  Resp: 20    Intake/Output Summary (Last 24 hours) at 02/06/13 1011 Last data filed at 02/05/13 2300  Gross per 24 hour  Intake    500 ml  Output      0 ml  Net    500 ml   Filed Weights   02/04/13 0934 02/04/13 1708 02/06/13 0517  Weight: 136.079 kg (300 lb) 137.9 kg (304 lb 0.2 oz) 141.885 kg (312 lb 12.8 oz)    Exam:   General:  alert  Cardiovascular: s1,s2 rrr  Respiratory: CTA BL  Abdomen: soft, obese, mild distended, mild tender diffuse  Musculoskeletal: no le edema   Data Reviewed: Basic Metabolic Panel:  Recent Labs Lab 02/04/13 0950 02/05/13 0647  NA 138 139  K 3.7 3.8  CL 98 101  CO2 25 29  GLUCOSE 137*  112*  BUN 14 10  CREATININE 0.60 0.65  CALCIUM 9.1 8.2*   Liver Function Tests:  Recent Labs Lab 02/04/13 0950  AST 25  ALT 23  ALKPHOS 103  BILITOT 0.5  PROT 7.6  ALBUMIN 4.0    Recent Labs Lab 02/04/13 0950  LIPASE 28   No results found for this basename: AMMONIA,  in the last 168 hours CBC:  Recent Labs Lab 02/04/13 0950 02/05/13 0647  WBC 7.3 6.7  NEUTROABS 5.2  --   HGB 13.7 11.9*  HCT 41.1 35.7*  MCV 87.6 88.1  PLT 208 170   Cardiac Enzymes: No results found for this basename: CKTOTAL, CKMB, CKMBINDEX, TROPONINI,  in the last 168 hours BNP (last 3 results) No results found for this basename: PROBNP,  in the last 8760 hours CBG: No results found for this basename: GLUCAP,  in the last 168 hours  Recent Results (from the past 240 hour(s))  URINE CULTURE     Status: None   Collection Time    02/04/13  1:25 PM      Result Value Range Status   Specimen Description URINE, CLEAN CATCH   Final   Special Requests NONE   Final   Culture  Setup Time     Final   Value: 02/04/2013 22:12     Performed at [place]  Final   Value: >=100,000 COLONIES/ML     Performed at [facility]   Culture     Final   Value: GRAM NEGATIVE RODS     Performed at [facility]   Report Status PENDING   Incomplete     Studies: Ct Abdomen Pelvis W Contrast  02/04/2013   CLINICAL DATA:  Mid abdominal pain with nausea and vomiting. History of ovarian cancer.  EXAM: CT ABDOMEN AND PELVIS WITH CONTRAST  TECHNIQUE: Multidetector CT imaging of the abdomen and pelvis was performed using the standard protocol following bolus administration of intravenous contrast.  CONTRAST:  16mL OMNIPAQUE IOHEXOL 300 MG/ML SOLN, 194mL OMNIPAQUE IOHEXOL 300 MG/ML SOLN  COMPARISON:  Abdominal pelvic CT 09/24/2004.  FINDINGS: The visualized lung bases are clear. There is no significant pleural or pericardial effusion.  The liver, gallbladder, biliary system and  pancreas appear normal. The spleen, adrenal glands and kidneys appear normal. There is no hydronephrosis.  The stomach and proximal small bowel appear unremarkable. The mid small bowel is mildly distended with and air feces sign. The distal small bowel is decompressed. The area of transition appears to be is in the infraumbilical region where there are apparent surgical clips. No soft tissue mass is apparent in this area. There is mild mesenteric edema and a small amount of free pelvic fluid. No peritoneal nodularity or enlarged lymph nodes are demonstrated. The colon appears normal aside from mild diverticulosis distally. There is a small supraumbilical hernia containing fat and a knuckle of the transverse colon. There is no evidence of bowel incarceration.  There is no pelvic mass status post hysterectomy. The bladder appears normal.  There is degenerative disc disease at L5-S1. No worrisome osseous findings are evident.  IMPRESSION: 1. Partial mid to distal small bowel obstruction, apparently at the level of previous surgery in the infraumbilical region, probably a small bowel surgical anastomosis. No extrinsic mass is apparent in this region. 2. Mild mesenteric edema and minimal free pelvic fluid. No specific signs of metastatic disease. 3. Small supraumbilical hernia containing fat and a knuckle of transverse colon. No evidence of bowel incarceration.   Electronically Signed   By: Camie Patience M.D.   On: 02/04/2013 12:56   Dg Abd Acute W/chest  02/04/2013   CLINICAL DATA:  Abdominal pain and nausea, history of ovarian cancer  EXAM: ACUTE ABDOMEN SERIES (ABDOMEN 2 VIEW & CHEST 1 VIEW)  COMPARISON:  04/01/2007  FINDINGS: The heart size and vascular pattern are normal. Lungs are clear. Stable right Port-A-Cath.  No free air. There are dilated loops of small bowel in the upper mid and left abdomen. These measure up to 4 cm in diameter. Gas is seen to the colon and into the rectum.  IMPRESSION: Findings suggest  the possibility of low grade partial small bowel obstruction. Recommend radiographic followup.   Electronically Signed   By: Skipper Cliche M.D.   On: 02/04/2013 10:22    Scheduled Meds: . aspirin EC  81 mg Oral Daily  . enoxaparin (LOVENOX) injection  40 mg Subcutaneous Q24H  . levofloxacin (LEVAQUIN) IV  500 mg Intravenous Q24H   Continuous Infusions: . sodium chloride 125 mL/hr at 02/06/13 0431    Principal Problem:   Abdominal pain Active Problems:   SBO (small bowel obstruction)   HTN (hypertension)   Small bowel obstruction    Time spent: >35 minutes     Kinnie Feil  [facility] Pager [phone]. If 7PM-7AM, please contact night-coverage at  [email or web address], password [facility] 02/06/2013, 10:11 AM  LOS: 2 days

## 2013-02-06 NOTE — Progress Notes (Signed)
Surgery  Filed Vitals:   02/06/13 0517  BP: 114/75  Pulse: 77  Temp: 98.5 F (36.9 C)  Resp: 20   Clinically pt continues to improve and is passing flatus and is ambulating well without much discomfort.  Abdomen is soft and pt is hungry. AAS pending.

## 2013-02-07 MED ORDER — ZOLPIDEM TARTRATE 5 MG PO TABS
5.0000 mg | ORAL_TABLET | Freq: Once | ORAL | Status: AC
  Administered 2013-02-07: 5 mg via ORAL
  Filled 2013-02-07: qty 1

## 2013-02-07 MED ORDER — DOCUSATE SODIUM 100 MG PO CAPS
100.0000 mg | ORAL_CAPSULE | Freq: Two times a day (BID) | ORAL | Status: DC
  Administered 2013-02-07 – 2013-02-09 (×5): 100 mg via ORAL
  Filled 2013-02-07 (×5): qty 1

## 2013-02-07 NOTE — Progress Notes (Signed)
Surgery  Filed Vitals:   02/07/13 0415  BP: 146/83  Pulse: 77  Temp: 97.8 F (36.6 C)  Resp: 20    Pt continues to improve.  Still passing flatus no BM.  Will continue liquid diet and ambulation.

## 2013-02-07 NOTE — Progress Notes (Signed)
Patient ambulated one lap around the entire nursing unit with staff standing by. Tolerated well.

## 2013-02-07 NOTE — Progress Notes (Signed)
[facility] PROGRESS NOTE  Annette Clark UMP:[id number] DOB: [birth year] DOA: 02/04/2013 PCP: [name], MD  Assessment/Plan: Principal Problem:  Abdominal pain  Active Problems:  SBO (small bowel obstruction)  HTN (hypertension)   63 y.o. female with PMH of HTN, h/o ovarian CA s/p surgery, chemo in the past presented with diffuse non radiating abdominal pian associated with nausea, no vomiting; last BM 1/16; since no flatus; ED/CT showed SBO   1. Abdominal pain, SBO; exam no s/s of acute abdomen;  -clinically improving; + flatus; no BM; diet started per surgery; cont pain control, antiemetics, appreciate surgery management   2. HTN, hold ACE; prn hydralazine   3. Probable UTI; start atx, urine culture: klebsiella sen quinolones; last dose 1/20;    Surgery; if consultant consulted, please document name and whether formally or informally consulted   Code Status: full Family Communication: d/w patient, her family  (indicate person spoken with, relationship, and if by phone, the number) Disposition Plan: home when ready    Consultants:  Surgery   Procedures:  None   Antibiotics:  Levofloxacin 1/17<<<   (indicate start date, and stop date if known)  HPI/Subjective: alert  Objective: Filed Vitals:   02/07/13 0415  BP: 146/83  Pulse: 77  Temp: 97.8 F (36.6 C)  Resp: 20    Intake/Output Summary (Last 24 hours) at 02/07/13 1001 Last data filed at 02/06/13 1906  Gross per 24 hour  Intake    240 ml  Output    200 ml  Net     40 ml   Filed Weights   02/04/13 0934 02/04/13 1708 02/06/13 0517  Weight: 136.079 kg (300 lb) 137.9 kg (304 lb 0.2 oz) 141.885 kg (312 lb 12.8 oz)    Exam:   General:  alert  Cardiovascular: s1,s2 rrr  Respiratory: CTA BL  Abdomen: soft, obese, mild distended, mild tender diffuse  Musculoskeletal: no le edema   Data Reviewed: Basic Metabolic Panel:  Recent Labs Lab 02/04/13 0950 02/05/13 0647  NA 138  139  K 3.7 3.8  CL 98 101  CO2 25 29  GLUCOSE 137* 112*  BUN 14 10  CREATININE 0.60 0.65  CALCIUM 9.1 8.2*   Liver Function Tests:  Recent Labs Lab 02/04/13 0950  AST 25  ALT 23  ALKPHOS 103  BILITOT 0.5  PROT 7.6  ALBUMIN 4.0    Recent Labs Lab 02/04/13 0950  LIPASE 28   No results found for this basename: AMMONIA,  in the last 168 hours CBC:  Recent Labs Lab 02/04/13 0950 02/05/13 0647  WBC 7.3 6.7  NEUTROABS 5.2  --   HGB 13.7 11.9*  HCT 41.1 35.7*  MCV 87.6 88.1  PLT 208 170   Cardiac Enzymes: No results found for this basename: CKTOTAL, CKMB, CKMBINDEX, TROPONINI,  in the last 168 hours BNP (last 3 results) No results found for this basename: PROBNP,  in the last 8760 hours CBG: No results found for this basename: GLUCAP,  in the last 168 hours  Recent Results (from the past 240 hour(s))  URINE CULTURE     Status: None   Collection Time    02/04/13  1:25 PM      Result Value Range Status   Specimen Description URINE, CLEAN CATCH   Final   Special Requests NONE   Final   Culture  Setup Time     Final   Value: 02/04/2013 22:12     Performed at [facility]  Colony Count     Final   Value: >=100,000 COLONIES/ML     Performed at [facility]   Culture     Final   Value: KLEBSIELLA PNEUMONIAE     Performed at [facility]   Report Status 02/06/2013 FINAL   Final   Organism ID, Bacteria KLEBSIELLA PNEUMONIAE   Final     Studies: Dg Abd 2 Views  02/06/2013   CLINICAL DATA:  Partial small bowel obstruction.  EXAM: ABDOMEN - 2 VIEW  COMPARISON:  CT 02/04/2013  FINDINGS: Contrast in the colon from prior CT. Dilated small bowel loops are present consistent with partial small bowel obstruction. No definite free air however the right hemidiaphragm is not fully evaluated on this study. Colon is nondilated.  IMPRESSION: Partial small bowel obstruction.   Electronically Signed   By: Franchot Gallo M.D.   On: 02/06/2013 10:44     Scheduled Meds: . aspirin EC  81 mg Oral Daily  . enoxaparin (LOVENOX) injection  40 mg Subcutaneous Q24H  . levofloxacin (LEVAQUIN) IV  500 mg Intravenous Q24H   Continuous Infusions: . sodium chloride 100 mL/hr at 02/07/13 0036    Principal Problem:   Abdominal pain Active Problems:   SBO (small bowel obstruction)   HTN (hypertension)   Small bowel obstruction    Time spent: >35 minutes     Kinnie Feil  [facility] Pager [phone]. If 7PM-7AM, please contact night-coverage at [email or web address], password [facility] 02/07/2013, 10:01 AM  LOS: 3 days

## 2013-02-08 MED ORDER — ZOLPIDEM TARTRATE 5 MG PO TABS: 5.0000 mg | ORAL_TABLET | Freq: Once | ORAL | Status: DC

## 2013-02-08 MED ORDER — LEVOFLOXACIN 500 MG PO TABS
500.0000 mg | ORAL_TABLET | Freq: Every day | ORAL | Status: DC
  Administered 2013-02-08: 500 mg via ORAL
  Filled 2013-02-08: qty 1

## 2013-02-08 MED ORDER — ZOLPIDEM TARTRATE 5 MG PO TABS
5.0000 mg | ORAL_TABLET | Freq: Once | ORAL | Status: AC
  Administered 2013-02-08: 5 mg via ORAL
  Filled 2013-02-08: qty 1

## 2013-02-08 NOTE — Progress Notes (Signed)
PHARMACIST - PHYSICIAN COMMUNICATION DR:   Karleen Hampshire CONCERNING: Antibiotic IV to Oral Route Change Policy  RECOMMENDATION: This patient is receiving Levaquin by the intravenous route.  Based on criteria approved by the Pharmacy and Therapeutics Committee, the antibiotic(s) is/are being converted to the equivalent oral dose form(s).   DESCRIPTION: These criteria include:  Patient being treated for a respiratory tract infection, urinary tract infection, cellulitis or clostridium difficile associated diarrhea if on metronidazole  The patient is not neutropenic and does not exhibit a GI malabsorption state  The patient is eating (either orally or via tube) and/or has been taking other orally administered medications for a least 24 hours  The patient is improving clinically and has a Tmax < 100.5  If you have questions about this conversion, please contact the Pharmacy Department  [x]   [phone] )  [facility] []   [phone] )  [facility]  []   [phone] )  [facility] []   [phone] )  [place], PharmD, BCPS 02/08/2013@11 :51 AM

## 2013-02-08 NOTE — Progress Notes (Signed)
[facility] PROGRESS NOTE  Annette Clark TDD:[id number] DOB: [birth year] DOA: 02/04/2013 PCP: [name], MD  Assessment/Plan: Principal Problem:  Abdominal pain  Active Problems:  SBO (small bowel obstruction)  HTN (hypertension)   63 y.o. female with PMH of HTN, h/o ovarian CA s/p surgery, chemo in the past presented with diffuse non radiating abdominal pian associated with nausea, no vomiting; last BM 1/16; since no flatus; ED/CT showed SBO   1. Abdominal pain, SBO; exam no s/s of acute abdomen;  -clinically improving; + flatus; no BM; diet started per surgery; cont pain control, antiemetics, appreciate surgery management   2. HTN, hold ACE; prn hydralazine   3. Probable UTI; start atx, urine culture: klebsiella sen quinolones; last dose 1/21;       Code Status: full Family Communication: d/w patient, her family  (indicate person spoken with, relationship, and if by phone, the number) Disposition Plan: home when ready    Consultants:  Surgery   Procedures:  None   Antibiotics:  Levofloxacin 1/17<<< 1/21   (indicate start date, and stop date if known)  HPI/Subjective: alert  Objective: Filed Vitals:   02/08/13 0540  BP: 136/82  Pulse: 66  Temp: 98.1 F (36.7 C)  Resp: 20    Intake/Output Summary (Last 24 hours) at 02/08/13 1024 Last data filed at 02/07/13 2100  Gross per 24 hour  Intake 5431.25 ml  Output   1000 ml  Net 4431.25 ml   Filed Weights   02/04/13 0934 02/04/13 1708 02/06/13 0517  Weight: 136.079 kg (300 lb) 137.9 kg (304 lb 0.2 oz) 141.885 kg (312 lb 12.8 oz)    Exam:   General:  alert  Cardiovascular: s1,s2 rrr  Respiratory: CTA BL  Abdomen: soft, obese, mild distended, mild tender diffuse  Musculoskeletal: no le edema   Data Reviewed: Basic Metabolic Panel:  Recent Labs Lab 02/04/13 0950 02/05/13 0647  NA 138 139  K 3.7 3.8  CL 98 101  CO2 25 29  GLUCOSE 137* 112*  BUN 14 10  CREATININE 0.60  0.65  CALCIUM 9.1 8.2*   Liver Function Tests:  Recent Labs Lab 02/04/13 0950  AST 25  ALT 23  ALKPHOS 103  BILITOT 0.5  PROT 7.6  ALBUMIN 4.0    Recent Labs Lab 02/04/13 0950  LIPASE 28   No results found for this basename: AMMONIA,  in the last 168 hours CBC:  Recent Labs Lab 02/04/13 0950 02/05/13 0647  WBC 7.3 6.7  NEUTROABS 5.2  --   HGB 13.7 11.9*  HCT 41.1 35.7*  MCV 87.6 88.1  PLT 208 170   Cardiac Enzymes: No results found for this basename: CKTOTAL, CKMB, CKMBINDEX, TROPONINI,  in the last 168 hours BNP (last 3 results) No results found for this basename: PROBNP,  in the last 8760 hours CBG: No results found for this basename: GLUCAP,  in the last 168 hours  Recent Results (from the past 240 hour(s))  URINE CULTURE     Status: None   Collection Time    02/04/13  1:25 PM      Result Value Range Status   Specimen Description URINE, CLEAN CATCH   Final   Special Requests NONE   Final   Culture  Setup Time     Final   Value: 02/04/2013 22:12     Performed at [facility]     Final   Value: >=100,000 COLONIES/ML     Performed at  [facility]     Final   Value: KLEBSIELLA PNEUMONIAE     Performed at [facility]   Report Status 02/06/2013 FINAL   Final   Organism ID, Bacteria KLEBSIELLA PNEUMONIAE   Final     Studies: Dg Abd 2 Views  02/06/2013   CLINICAL DATA:  Partial small bowel obstruction.  EXAM: ABDOMEN - 2 VIEW  COMPARISON:  CT 02/04/2013  FINDINGS: Contrast in the colon from prior CT. Dilated small bowel loops are present consistent with partial small bowel obstruction. No definite free air however the right hemidiaphragm is not fully evaluated on this study. Colon is nondilated.  IMPRESSION: Partial small bowel obstruction.   Electronically Signed   By: Franchot Gallo M.D.   On: 02/06/2013 10:44    Scheduled Meds: . aspirin EC  81 mg Oral Daily  . docusate sodium  100 mg Oral BID  .  enoxaparin (LOVENOX) injection  40 mg Subcutaneous Q24H  . levofloxacin (LEVAQUIN) IV  500 mg Intravenous Q24H   Continuous Infusions: . sodium chloride 100 mL/hr at 02/07/13 0036    Principal Problem:   Abdominal pain Active Problems:   SBO (small bowel obstruction)   HTN (hypertension)   Small bowel obstruction    Time spent: >35 minutes     [facility] Pager [phone] If 7PM-7AM, please contact night-coverage at [email or web address], password [email or web address] 02/08/2013, 10:24 AM  LOS: 4 days

## 2013-02-09 MED ORDER — HEPARIN SOD (PORK) LOCK FLUSH 100 UNIT/ML IV SOLN
500.0000 [IU] | INTRAVENOUS | Status: DC | PRN
  Filled 2013-02-09: qty 5

## 2013-02-09 MED ORDER — DSS 100 MG PO CAPS: 100.0000 mg | ORAL_CAPSULE | Freq: Two times a day (BID) | ORAL | Status: DC | PRN

## 2013-02-09 MED ORDER — HEPARIN SOD (PORK) LOCK FLUSH 100 UNIT/ML IV SOLN
500.0000 [IU] | INTRAVENOUS | Status: DC
  Administered 2013-02-09: 500 [IU]

## 2013-02-09 NOTE — Discharge Summary (Signed)
Physician Discharge Summary  Annette Clark:[id number] DOB: Jan 10, 1951 DOA: 02/04/2013  PCP: Annette Kilts, MD  Admit date: 02/04/2013 Discharge date: 02/09/2013  Time spent: 30 minutes  Recommendations for Outpatient Follow-up:  1. Follow up withPCP in one week.   Discharge Diagnoses:  Principal Problem:   Abdominal pain Active Problems:   SBO (small bowel obstruction)   HTN (hypertension)   Small bowel obstruction   Discharge Condition: improved.   Diet recommendation: low sodium diet.   Filed Weights   02/04/13 0934 02/04/13 1708 02/06/13 0517  Weight: 136.079 kg (300 lb) 137.9 kg (304 lb 0.2 oz) 141.885 kg (312 lb 12.8 oz)    History of present illness/ Hospital Course:   63 y.o. female with PMH of HTN, h/o ovarian CA s/p surgery, chemo in the past presented with diffuse non radiating abdominal pian associated with nausea, no vomiting; last BM 1/16; since no flatus; ED/CT showed SBO  1. Abdominal pain, SBO; exam no s/s of acute abdomen;  -clinically improving; + flatus; bm this am. diet started per surgery;  She is able to tolerate soft diet without any issues. She will be discharged home to follow up with PCP.  2. HTN, ; resum ehome medications.  3. Probable UTI;  urine culture: klebsiella sen quinolones;completed 5 days of antibiotics.   Procedures:  ABD FILM   Consultations:  surgery  Discharge Exam: Filed Vitals:   02/09/13 0651  BP: 148/79  Pulse: 62  Temp: 98.1 F (36.7 C)  Resp: 18   General: alert  Cardiovascular: s1,s2 rrr  Respiratory: CTA BL  Abdomen: soft, obese,no tenderness .  Musculoskeletal: no le edema     Discharge Instructions  Discharge Orders   Future Appointments Provider Department Dept Phone   03/14/2013 10:30 AM [facility] Chair [place] [phone]   04/10/2013 10:30 AM Alvino Chapel, MD [facility] [phone]   05/09/2013 10:30 AM [facility] Chair [place] [phone]   10/25/2013 1:30 PM [facility] Covering Provider [place] [phone]   Future Orders Complete By Expires   Discharge instructions  As directed    Comments:     Follow up with PCP in one week.       Medication List         aspirin EC 81 MG tablet  Take 81 mg by mouth daily.     DSS 100 MG Caps  Take 100 mg by mouth 2 (two) times daily as needed for mild constipation.     lisinopril 10 MG tablet  Commonly known as:  PRINIVIL,ZESTRIL  Take 10 mg by mouth daily.       No Known Allergies     Follow-up Information   Follow up with Annette Kilts, MD. Schedule an appointment as soon as possible for a visit in 1 week.   Specialty:  Family Medicine   Contact information:   [address] [place] [address] [phone]        The results of significant diagnostics from this hospitalization (including imaging, microbiology, ancillary and laboratory) are listed below for reference.    Significant Diagnostic Studies: Ct Abdomen Pelvis W Contrast  02/04/2013   CLINICAL DATA:  Mid abdominal pain with nausea and vomiting. History of ovarian cancer.  EXAM: CT ABDOMEN AND PELVIS WITH CONTRAST  TECHNIQUE: Multidetector CT imaging of the abdomen and pelvis was performed using the standard protocol following bolus  administration of intravenous contrast.  CONTRAST:  64mL OMNIPAQUE IOHEXOL 300 MG/ML SOLN, 165mL OMNIPAQUE IOHEXOL 300 MG/ML SOLN  COMPARISON:  Abdominal pelvic CT 09/24/2004.  FINDINGS: The visualized lung bases are clear. There is no significant pleural or pericardial effusion.  The liver, gallbladder, biliary system and pancreas appear normal. The spleen, adrenal glands and kidneys appear normal. There is no hydronephrosis.  The stomach and proximal small bowel appear unremarkable. The mid small bowel is mildly distended with and air feces sign. The distal small bowel is decompressed. The area  of transition appears to be is in the infraumbilical region where there are apparent surgical clips. No soft tissue mass is apparent in this area. There is mild mesenteric edema and a small amount of free pelvic fluid. No peritoneal nodularity or enlarged lymph nodes are demonstrated. The colon appears normal aside from mild diverticulosis distally. There is a small supraumbilical hernia containing fat and a knuckle of the transverse colon. There is no evidence of bowel incarceration.  There is no pelvic mass status post hysterectomy. The bladder appears normal.  There is degenerative disc disease at L5-S1. No worrisome osseous findings are evident.  IMPRESSION: 1. Partial mid to distal small bowel obstruction, apparently at the level of previous surgery in the infraumbilical region, probably a small bowel surgical anastomosis. No extrinsic mass is apparent in this region. 2. Mild mesenteric edema and minimal free pelvic fluid. No specific signs of metastatic disease. 3. Small supraumbilical hernia containing fat and a knuckle of transverse colon. No evidence of bowel incarceration.   Electronically Signed   By: Camie Patience M.D.   On: 02/04/2013 12:56   Dg Abd 2 Views  02/06/2013   CLINICAL DATA:  Partial small bowel obstruction.  EXAM: ABDOMEN - 2 VIEW  COMPARISON:  CT 02/04/2013  FINDINGS: Contrast in the colon from prior CT. Dilated small bowel loops are present consistent with partial small bowel obstruction. No definite free air however the right hemidiaphragm is not fully evaluated on this study. Colon is nondilated.  IMPRESSION: Partial small bowel obstruction.   Electronically Signed   By: Franchot Gallo M.D.   On: 02/06/2013 10:44   Dg Abd Acute W/chest  02/04/2013   CLINICAL DATA:  Abdominal pain and nausea, history of ovarian cancer  EXAM: ACUTE ABDOMEN SERIES (ABDOMEN 2 VIEW & CHEST 1 VIEW)  COMPARISON:  04/01/2007  FINDINGS: The heart size and vascular pattern are normal. Lungs are clear. Stable  right Port-A-Cath.  No free air. There are dilated loops of small bowel in the upper mid and left abdomen. These measure up to 4 cm in diameter. Gas is seen to the colon and into the rectum.  IMPRESSION: Findings suggest the possibility of low grade partial small bowel obstruction. Recommend radiographic followup.   Electronically Signed   By: Skipper Cliche M.D.   On: 02/04/2013 10:22    Microbiology: Recent Results (from the past 240 hour(s))  URINE CULTURE     Status: None   Collection Time    02/04/13  1:25 PM      Result Value Range Status   Specimen Description URINE, CLEAN CATCH   Final   Special Requests NONE   Final   Culture  Setup Time     Final   Value: 02/04/2013 22:12     Performed at [facility]     Final   Value: >=100,000 COLONIES/ML     Performed at [facility]  Culture     Final   Value: KLEBSIELLA PNEUMONIAE     Performed at [facility]   Report Status 02/06/2013 FINAL   Final   Organism ID, Bacteria KLEBSIELLA PNEUMONIAE   Final     Labs: Basic Metabolic Panel:  Recent Labs Lab 02/04/13 0950 02/05/13 0647  NA 138 139  K 3.7 3.8  CL 98 101  CO2 25 29  GLUCOSE 137* 112*  BUN 14 10  CREATININE 0.60 0.65  CALCIUM 9.1 8.2*   Liver Function Tests:  Recent Labs Lab 02/04/13 0950  AST 25  ALT 23  ALKPHOS 103  BILITOT 0.5  PROT 7.6  ALBUMIN 4.0    Recent Labs Lab 02/04/13 0950  LIPASE 28   No results found for this basename: AMMONIA,  in the last 168 hours CBC:  Recent Labs Lab 02/04/13 0950 02/05/13 0647  WBC 7.3 6.7  NEUTROABS 5.2  --   HGB 13.7 11.9*  HCT 41.1 35.7*  MCV 87.6 88.1  PLT 208 170   Cardiac Enzymes: No results found for this basename: CKTOTAL, CKMB, CKMBINDEX, TROPONINI,  in the last 168 hours BNP: BNP (last 3 results) No results found for this basename: PROBNP,  in the last 8760 hours CBG: No results found for this basename: GLUCAP,  in the last 168  hours     Signed:  Aylah Yeary  [facility] 02/09/2013, 1:15 PM

## 2013-02-09 NOTE — Progress Notes (Signed)
Surgery   Filed Vitals:   02/09/13 0651  BP: 148/79  Pulse: 62  Temp: 98.1 F (36.7 C)  Resp: 18    Clinically pt's  Partial obstruction has resolved.  Abdomen is completely soft.  Pt can be discharged from surgical point of view.  No further x-rays needed from my point of view.

## 2013-02-09 NOTE — Progress Notes (Signed)
AVS reviewed with patient.  Patient verbalized understanding of discharge instructions, physician follow-up and medications.  Patient's port deaccessed.  Site WNL.  Patient reports all belongings intact and in possession at time of discharge.  Patient transported by NT via w/c to main entrance for discharge.  Patient stable at time of discharge.

## 2013-02-28 ENCOUNTER — Encounter (HOSPITAL_COMMUNITY): Payer: BC Managed Care – PPO

## 2013-03-14 ENCOUNTER — Encounter (HOSPITAL_COMMUNITY): Payer: BC Managed Care – PPO

## 2013-04-10 ENCOUNTER — Encounter: Payer: Self-pay | Admitting: Gynecology

## 2013-04-10 ENCOUNTER — Ambulatory Visit: Payer: BC Managed Care – PPO | Attending: Gynecology | Admitting: Gynecology

## 2013-04-10 VITALS — BP 142/94 | HR 78 | Temp 97.8°F | Resp 20 | Ht 64.0 in | Wt 299.6 lb

## 2013-04-10 DIAGNOSIS — Z79899 Other long term (current) drug therapy: Secondary | ICD-10-CM | POA: Insufficient documentation

## 2013-04-10 DIAGNOSIS — Z87891 Personal history of nicotine dependence: Secondary | ICD-10-CM | POA: Insufficient documentation

## 2013-04-10 DIAGNOSIS — C569 Malignant neoplasm of unspecified ovary: Secondary | ICD-10-CM

## 2013-04-10 DIAGNOSIS — Z86711 Personal history of pulmonary embolism: Secondary | ICD-10-CM | POA: Insufficient documentation

## 2013-04-10 DIAGNOSIS — I10 Essential (primary) hypertension: Secondary | ICD-10-CM | POA: Insufficient documentation

## 2013-04-10 DIAGNOSIS — Z7982 Long term (current) use of aspirin: Secondary | ICD-10-CM | POA: Insufficient documentation

## 2013-04-10 DIAGNOSIS — Z800 Family history of malignant neoplasm of digestive organs: Secondary | ICD-10-CM | POA: Insufficient documentation

## 2013-04-10 DIAGNOSIS — Z809 Family history of malignant neoplasm, unspecified: Secondary | ICD-10-CM | POA: Insufficient documentation

## 2013-04-10 DIAGNOSIS — Z90710 Acquired absence of both cervix and uterus: Secondary | ICD-10-CM | POA: Insufficient documentation

## 2013-04-10 NOTE — Patient Instructions (Signed)
Plan to follow up with Dr. Fermin Schwab in one year or sooner if needed.  Please call in Nov or Dec 2015 to schedule an appt for March 2016.
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
## 2013-04-11 ENCOUNTER — Encounter (HOSPITAL_COMMUNITY): Payer: BC Managed Care – PPO

## 2013-04-25 ENCOUNTER — Encounter (HOSPITAL_COMMUNITY): Payer: BC Managed Care – PPO | Attending: Hematology and Oncology

## 2013-04-25 DIAGNOSIS — Z452 Encounter for adjustment and management of vascular access device: Secondary | ICD-10-CM

## 2013-04-25 DIAGNOSIS — C569 Malignant neoplasm of unspecified ovary: Secondary | ICD-10-CM

## 2013-04-25 DIAGNOSIS — Z95828 Presence of other vascular implants and grafts: Secondary | ICD-10-CM

## 2013-04-25 MED ORDER — HEPARIN SOD (PORK) LOCK FLUSH 100 UNIT/ML IV SOLN
INTRAVENOUS | Status: AC
  Filled 2013-04-25: qty 5

## 2013-04-25 MED ORDER — HEPARIN SOD (PORK) LOCK FLUSH 100 UNIT/ML IV SOLN
500.0000 [IU] | Freq: Once | INTRAVENOUS | Status: AC
  Administered 2013-04-25: 500 [IU] via INTRAVENOUS

## 2013-04-25 MED ORDER — SODIUM CHLORIDE 0.9 % IJ SOLN
10.0000 mL | INTRAMUSCULAR | Status: DC | PRN
  Administered 2013-04-25: 10 mL via INTRAVENOUS

## 2013-04-25 NOTE — Progress Notes (Signed)
Annette Clark presented for Portacath access and flush. Portacath located right chest wall accessed with  H 20 needle. Good blood return present. Portacath flushed with 70ml NS and 500U/30ml Heparin and needle removed intact. Procedure without incident. Patient tolerated procedure well.

## 2013-05-09 ENCOUNTER — Encounter (HOSPITAL_COMMUNITY): Payer: BC Managed Care – PPO

## 2013-06-06 ENCOUNTER — Encounter (HOSPITAL_COMMUNITY): Payer: BC Managed Care – PPO

## 2013-06-20 ENCOUNTER — Encounter (HOSPITAL_COMMUNITY): Payer: BC Managed Care – PPO | Attending: Hematology and Oncology

## 2013-06-20 DIAGNOSIS — Z9889 Other specified postprocedural states: Secondary | ICD-10-CM | POA: Insufficient documentation

## 2013-06-20 DIAGNOSIS — Z452 Encounter for adjustment and management of vascular access device: Secondary | ICD-10-CM

## 2013-06-20 DIAGNOSIS — C569 Malignant neoplasm of unspecified ovary: Secondary | ICD-10-CM

## 2013-06-20 DIAGNOSIS — Z95828 Presence of other vascular implants and grafts: Secondary | ICD-10-CM

## 2013-06-20 MED ORDER — SODIUM CHLORIDE 0.9 % IJ SOLN
10.0000 mL | INTRAMUSCULAR | Status: DC | PRN
  Administered 2013-06-20: 10 mL via INTRAVENOUS

## 2013-06-20 MED ORDER — HEPARIN SOD (PORK) LOCK FLUSH 100 UNIT/ML IV SOLN
500.0000 [IU] | Freq: Once | INTRAVENOUS | Status: AC
  Administered 2013-06-20: 500 [IU] via INTRAVENOUS

## 2013-06-20 MED ORDER — HEPARIN SOD (PORK) LOCK FLUSH 100 UNIT/ML IV SOLN
INTRAVENOUS | Status: AC
  Filled 2013-06-20: qty 5

## 2013-06-20 NOTE — Progress Notes (Signed)
Annette Clark presented for Portacath access and flush. Proper placement of portacath confirmed by CXR. Portacath located right chest wall accessed with  H 20 needle. Good blood return present. Portacath flushed with 20ml NS and 500U/5ml Heparin and needle removed intact. Procedure without incident. Patient tolerated procedure well.   

## 2013-08-09 ENCOUNTER — Telehealth: Payer: Self-pay | Admitting: *Deleted

## 2013-08-09 NOTE — Telephone Encounter (Signed)
Pt called states " I have a history of Ovarian cancer, last seen in march 2015. I had spotting on Saturday and Sunday, and a few weeks ago I had really bad abdominal pain and after vomiting and the pain went away.. Pt reports she has not had a very good appetite for a while. Pt no longer seeing provider at [facility], only going there  For port flush appts.

## 2013-08-15 ENCOUNTER — Encounter (HOSPITAL_COMMUNITY): Payer: BC Managed Care – PPO | Attending: Hematology and Oncology

## 2013-08-15 DIAGNOSIS — C569 Malignant neoplasm of unspecified ovary: Secondary | ICD-10-CM

## 2013-08-15 DIAGNOSIS — Z9889 Other specified postprocedural states: Secondary | ICD-10-CM | POA: Insufficient documentation

## 2013-08-15 DIAGNOSIS — Z95828 Presence of other vascular implants and grafts: Secondary | ICD-10-CM

## 2013-08-15 DIAGNOSIS — Z452 Encounter for adjustment and management of vascular access device: Secondary | ICD-10-CM

## 2013-08-15 MED ORDER — SODIUM CHLORIDE 0.9 % IJ SOLN
10.0000 mL | Freq: Once | INTRAMUSCULAR | Status: AC
  Administered 2013-08-15: 10 mL via INTRAVENOUS

## 2013-08-15 MED ORDER — HEPARIN SOD (PORK) LOCK FLUSH 100 UNIT/ML IV SOLN
500.0000 [IU] | Freq: Once | INTRAVENOUS | Status: AC
  Administered 2013-08-15: 500 [IU] via INTRAVENOUS
  Filled 2013-08-15: qty 5

## 2013-08-15 NOTE — Progress Notes (Signed)
Binnie Rail presented for Portacath access and flush. Proper placement of portacath confirmed by CXR. Portacath located right chest wall accessed with  H 20 needle. Good blood return present. Portacath flushed with 32ml NS and 500U/51ml Heparin and needle removed intact. Procedure without incident. Patient tolerated procedure well.

## 2013-08-18 ENCOUNTER — Encounter: Payer: Self-pay | Admitting: Gynecology

## 2013-08-18 ENCOUNTER — Ambulatory Visit: Payer: BC Managed Care – PPO | Attending: Gynecology | Admitting: Gynecology

## 2013-08-18 VITALS — BP 116/91 | HR 82 | Temp 97.9°F | Resp 18 | Ht 64.0 in | Wt 291.5 lb

## 2013-08-18 DIAGNOSIS — Z808 Family history of malignant neoplasm of other organs or systems: Secondary | ICD-10-CM | POA: Insufficient documentation

## 2013-08-18 DIAGNOSIS — Z87891 Personal history of nicotine dependence: Secondary | ICD-10-CM | POA: Insufficient documentation

## 2013-08-18 DIAGNOSIS — Z9221 Personal history of antineoplastic chemotherapy: Secondary | ICD-10-CM

## 2013-08-18 DIAGNOSIS — Z800 Family history of malignant neoplasm of digestive organs: Secondary | ICD-10-CM | POA: Insufficient documentation

## 2013-08-18 DIAGNOSIS — Z9079 Acquired absence of other genital organ(s): Secondary | ICD-10-CM | POA: Insufficient documentation

## 2013-08-18 DIAGNOSIS — Z86711 Personal history of pulmonary embolism: Secondary | ICD-10-CM | POA: Insufficient documentation

## 2013-08-18 DIAGNOSIS — I10 Essential (primary) hypertension: Secondary | ICD-10-CM | POA: Insufficient documentation

## 2013-08-18 DIAGNOSIS — C569 Malignant neoplasm of unspecified ovary: Secondary | ICD-10-CM | POA: Insufficient documentation

## 2013-08-18 DIAGNOSIS — Z90710 Acquired absence of both cervix and uterus: Secondary | ICD-10-CM | POA: Insufficient documentation

## 2013-08-18 DIAGNOSIS — Z7982 Long term (current) use of aspirin: Secondary | ICD-10-CM | POA: Insufficient documentation
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
## 2013-08-18 NOTE — Patient Instructions (Signed)
Return to see [place] in one year. Contact us if the bleeding gets worse.

## 2013-10-10 ENCOUNTER — Encounter (HOSPITAL_COMMUNITY): Payer: BC Managed Care – PPO | Attending: Hematology and Oncology

## 2013-10-10 DIAGNOSIS — C569 Malignant neoplasm of unspecified ovary: Secondary | ICD-10-CM

## 2013-10-10 DIAGNOSIS — Z452 Encounter for adjustment and management of vascular access device: Secondary | ICD-10-CM

## 2013-10-10 DIAGNOSIS — Z9889 Other specified postprocedural states: Secondary | ICD-10-CM | POA: Insufficient documentation

## 2013-10-10 DIAGNOSIS — Z95828 Presence of other vascular implants and grafts: Secondary | ICD-10-CM

## 2013-10-10 MED ORDER — HEPARIN SOD (PORK) LOCK FLUSH 100 UNIT/ML IV SOLN
500.0000 [IU] | Freq: Once | INTRAVENOUS | Status: AC
  Administered 2013-10-10: 500 [IU] via INTRAVENOUS
  Filled 2013-10-10: qty 5

## 2013-10-10 MED ORDER — SODIUM CHLORIDE 0.9 % IJ SOLN
10.0000 mL | INTRAMUSCULAR | Status: DC | PRN
  Administered 2013-10-10: 10 mL via INTRAVENOUS

## 2013-10-10 NOTE — Progress Notes (Signed)
Binnie Rail presented for Portacath access and flush.  .  Portacath located right chest wall accessed with  H 20 needle.  Good blood return present. Portacath flushed with 44ml NS and 500U/67ml Heparin and needle removed intact.  Procedure tolerated well and without incident.

## 2013-10-17 ENCOUNTER — Other Ambulatory Visit (HOSPITAL_COMMUNITY): Payer: Self-pay | Admitting: Family Medicine

## 2013-10-17 DIAGNOSIS — Z139 Encounter for screening, unspecified: Secondary | ICD-10-CM

## 2013-10-25 ENCOUNTER — Encounter (HOSPITAL_COMMUNITY): Payer: BC Managed Care – PPO | Attending: Hematology and Oncology

## 2013-10-25 ENCOUNTER — Encounter (HOSPITAL_COMMUNITY): Payer: Self-pay

## 2013-10-25 ENCOUNTER — Ambulatory Visit (HOSPITAL_COMMUNITY): Payer: BC Managed Care – PPO

## 2013-10-25 ENCOUNTER — Encounter (HOSPITAL_BASED_OUTPATIENT_CLINIC_OR_DEPARTMENT_OTHER): Payer: BC Managed Care – PPO

## 2013-10-25 VITALS — BP 137/73 | HR 72 | Temp 98.7°F | Resp 16 | Wt 293.0 lb

## 2013-10-25 DIAGNOSIS — C569 Malignant neoplasm of unspecified ovary: Secondary | ICD-10-CM | POA: Insufficient documentation

## 2013-10-25 DIAGNOSIS — Z86711 Personal history of pulmonary embolism: Secondary | ICD-10-CM

## 2013-10-25 DIAGNOSIS — Z8543 Personal history of malignant neoplasm of ovary: Secondary | ICD-10-CM

## 2013-10-25 LAB — CBC
HEMATOCRIT: 42.2 % (ref 36.0–46.0)
HEMOGLOBIN: 13.6 g/dL (ref 12.0–15.0)
MCH: 28.1 pg (ref 26.0–34.0)
MCHC: 32.2 g/dL (ref 30.0–36.0)
MCV: 87.2 fL (ref 78.0–100.0)
Platelets: 259 10*3/uL (ref 150–400)
RBC: 4.84 MIL/uL (ref 3.87–5.11)
RDW: 13.3 % (ref 11.5–15.5)
WBC: 6.8 10*3/uL (ref 4.0–10.5)

## 2013-10-25 LAB — BASIC METABOLIC PANEL
Anion gap: 12 (ref 5–15)
BUN: 14 mg/dL (ref 6–23)
CO2: 30 meq/L (ref 19–32)
CREATININE: 0.73 mg/dL (ref 0.50–1.10)
Calcium: 9.3 mg/dL (ref 8.4–10.5)
Chloride: 101 mEq/L (ref 96–112)
GFR calc Af Amer: >90 mL/min (ref >90)
GFR calc non Af Amer: 90 mL/min — ABNORMAL LOW (ref >90)
GLUCOSE: 104 mg/dL — AB (ref 70–99)
Potassium: 4.5 mEq/L (ref 3.7–5.3)
Sodium: 143 mEq/L (ref 137–147)

## 2013-10-25 LAB — CEA: CEA: 0.7 ng/mL (ref 0.0–5.0)
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
## 2013-10-25 NOTE — Patient Instructions (Signed)
[facility] Discharge Instructions  RECOMMENDATIONS MADE BY THE CONSULTANT AND ANY TEST RESULTS WILL BE SENT TO YOUR REFERRING PHYSICIAN.  EXAM FINDINGS BY THE PHYSICIAN TODAY AND SIGNS OR SYMPTOMS TO REPORT TO CLINIC OR PRIMARY PHYSICIAN: Exam and findings as discussed by Dr. Bubba Hales.  If there are any concerns with your labs we will contact you.  Report unexplained weight loss or weight gain or other concerns.  You can get your port out whenever you want.    INSTRUCTIONS/FOLLOW-UP: Follow-up in 1 year.  Thank you for choosing [facility] to provide your oncology and hematology care.  To afford each patient quality time with our providers, please arrive at least 15 minutes before your scheduled appointment time.  With your help, our goal is to use those 15 minutes to complete the necessary work-up to ensure our physicians have the information they need to help with your evaluation and healthcare recommendations.    Effective January 1st, 2014, we ask that you re-schedule your appointment with our physicians should you arrive 10 or more minutes late for your appointment.  We strive to give you quality time with our providers, and arriving late affects you and other patients whose appointments are after yours.    Again, thank you for choosing [facility].  Our hope is that these requests will decrease the amount of time that you wait before being seen by our physicians.       _____________________________________________________________  Should you have questions after your visit to [facility], please contact our office at [phone] between the hours of 8:30 a.m. and 4:30 p.m.  Voicemails left after 4:30 p.m. will not be returned until the following business day.  For prescription refill requests, have your pharmacy contact our office with your prescription refill request.     _______________________________________________________________  We hope that we have given you very good care.  You may receive a patient satisfaction survey in the mail, please complete it and return it as soon as possible.  We value your feedback!  _______________________________________________________________  Have you asked about our STAR program?  STAR stands for Survivorship Training and Rehabilitation, and this is a nationally recognized cancer care program that focuses on survivorship and rehabilitation.  Cancer and cancer treatments may cause problems, such as, pain, making you feel tired and keeping you from doing the things that you need or want to do. Cancer rehabilitation can help. Our goal is to reduce these troubling effects and help you have the best quality of life possible.  You may receive a survey from a nurse that asks questions about your current state of health.  Based on the survey results, all eligible patients will be referred to the [facility] program for an evaluation so we can better serve you!  A frequently asked questions sheet is available upon request.

## 2013-10-25 NOTE — Progress Notes (Signed)
LABS FOR CBC,BMP,CA125,CEA

## 2013-10-26 NOTE — Progress Notes (Signed)
[facility] OFFICE PROGRESS NOTE  PCP [name], MD [address] [place] [address]  DIAGNOSIS: Stage IIIC ovarian cancer S/P optimally debulked March 2006                        Adjuvant Chemotherapy with 6 cycles of Carboplatin/Taxol in remission   CURRENT THERAPY:  Surveillance   INTERVAL HEMATOLOGY/ONCOLOGY HX: Annette Clark is returning for routine annual medical oncology followup. She was admitted earlier this year a small bowel obstruction which resolved with medical therapy and has since then done well. 2 months ago she saw Dr. Fermin Schwab, her gynecologist, and received a good report. Followup CT scans in January 2015 was without suspicious signs for recurrent or metastatic disease. She denies any residual effects from  Chemotherapy. Annette Clark is seen regularly for flushing of her portacath which is functioning satisfactorily.         MEDICAL HISTORY:  Past Medical History  Diagnosis Date  . Ovarian cancer   . Hypertension   . Blood transfusion   . Obesity   . Hx of pulmonary embolus   . Bowel obstruction 01/2013    has Port-a-cath in place; Ovarian cancer; SBO (small bowel obstruction); HTN (hypertension); Abdominal pain; and Small bowel obstruction on her problem list.    ALLERGIES:  has No Known Allergies.  MEDICATIONS: has a current medication list which includes the following prescription(s): aspirin ec, dss, halobetasol, and lisinopril.  FAMILY HISTORY: family history includes Cancer in her mother. There is no history of Colon cancer.  REVIEW OF SYSTEMS:    SINCE YOUR LAST VISIT Been diagnosed or treated for a new medical /surgical  problem or condition: No Any Recent Xrays or studies performed: No Any new prescription or OTC medications: No ECOG Perf Status: Fully active, able to carry on all pre-disease performance without restriction Problems sleeping: No Medications taken to help sleep: No How is  your appetie: 100% normal Any Supplements: No Any trouble chewing or swallowing: No Any Nausea or Vomiting: No Any Bowel problems: No # Bowel Movements per week: 7 Any Urinary Issues: No Any Cardiac Problems: No Any Respiratory Issues: No Any Neurological Issues: No Do you live alone: No Feelings hopelessness: No You or your family have any concerns or Health changes: No Pain Assessment Pain Score: 0-No pain  Activities restricted by moderatlely by 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 Other than that discussed above is noncontributory.    PHYSICAL EXAMINATION:   weight is 293 lb (132.904 kg). Her oral temperature is 98.7 F (37.1 C). Her blood pressure is 137/73 and her pulse is 72. Her respiration is 16 and oxygen saturation is 94%.    GENERAL:alert, no distress and comfortable EYES: PERRL, EOM intact. Conjunctiva are pink and sclera clear OROPHARYNX:no exudate, no erythema and lips, buccal mucosa, and tongue normal  NECK: supple, thyroid normal size, non-tender, without nodularity CHEST/BREASTS: portacath site is unremarkable. No rib tenderness. Breast exam deferred.   LUNGS: clear to auscultation and percussion with normal breathing effort HEART: regular rate & rhythm and no murmurs, S3, or S4 ABDOMEN: soft, non-tender, normal bowel sounds;  liver: and spleen nonpalpable but exam limited by her obesity    MUSCULOSKELETAL: Spine without localized tenderness . DJD of her knees.  SKIN: no jaundice, bruises, or rashes. NEURO: alert & oriented x 3 with fluent speech, no focal motor/sensory deficits,      LABORATORY DATA: Office  Visit on 10/25/2013  Component Date Value Ref Range Status  . WBC 10/25/2013 6.8  4.0 - 10.5 K/uL Final  . RBC 10/25/2013 4.84  3.87 - 5.11 MIL/uL Final  . Hemoglobin 10/25/2013 13.6  12.0 - 15.0 g/dL Final  . HCT 10/25/2013 42.2  36.0 - 46.0 % Final  . MCV 10/25/2013 87.2  78.0 - 100.0 fL Final  . MCH 10/25/2013 28.1  26.0 - 34.0 pg Final  .  MCHC 10/25/2013 32.2  30.0 - 36.0 g/dL Final  . RDW 10/25/2013 13.3  11.5 - 15.5 % Final  . Platelets 10/25/2013 259  150 - 400 K/uL Final  . CEA 10/25/2013 0.7  0.0 - 5.0 ng/mL Final   Performed at [facility]  . Sodium 10/25/2013 143  137 - 147 mEq/L Final  . Potassium 10/25/2013 4.5  3.7 - 5.3 mEq/L Final  . Chloride 10/25/2013 101  96 - 112 mEq/L Final  . CO2 10/25/2013 30  19 - 32 mEq/L Final  . Glucose, Bld 10/25/2013 104* 70 - 99 mg/dL Final  . BUN 10/25/2013 14  6 - 23 mg/dL Final  . Creatinine, Ser 10/25/2013 0.73  0.50 - 1.10 mg/dL Final  . Calcium 10/25/2013 9.3  8.4 - 10.5 mg/dL Final  . GFR calc non Af Amer 10/25/2013 90* >90 mL/min Final  . GFR calc Af Amer 10/25/2013 >90  >90 mL/min Final   Comment: (NOTE)                          The eGFR has been calculated using the CKD EPI equation.                          This calculation has not been validated in all clinical situations.                          eGFR's persistently <90 mL/min signify possible Chronic Kidney                          Disease.  . Anion gap 10/25/2013 12  5 - 15 Final    CA-125 pending   RADIOGRAPHIC STUDIES: EXAM:  CT ABDOMEN AND PELVIS WITH CONTRAST  TECHNIQUE:  Multidetector CT imaging of the abdomen and pelvis was performed  using the standard protocol following bolus administration of  intravenous contrast.  CONTRAST: 42m OMNIPAQUE IOHEXOL 300 MG/ML SOLN, 1057mOMNIPAQUE  IOHEXOL 300 MG/ML SOLN  COMPARISON: Abdominal pelvic CT 09/24/2004.  FINDINGS:  The visualized lung bases are clear. There is no significant pleural  or pericardial effusion.  The liver, gallbladder, biliary system and pancreas appear normal.  The spleen, adrenal glands and kidneys appear normal. There is no  hydronephrosis.  The stomach and proximal small bowel appear unremarkable. The mid  small bowel is mildly distended with and air feces sign. The distal  small bowel is decompressed. The area of  transition appears to be is  in the infraumbilical region where there are apparent surgical  clips. No soft tissue mass is apparent in this area. There is mild  mesenteric edema and a small amount of free pelvic fluid. No  peritoneal nodularity or enlarged lymph nodes are demonstrated. The  colon appears normal aside from mild diverticulosis distally. There  is a small supraumbilical hernia containing fat and a knuckle of the  transverse colon.  There is no evidence of bowel incarceration.  There is no pelvic mass status post hysterectomy. The bladder  appears normal.  There is degenerative disc disease at L5-S1. No worrisome osseous  findings are evident.  IMPRESSION:  1. Partial mid to distal small bowel obstruction, apparently at the  level of previous surgery in the infraumbilical region, probably a  small bowel surgical anastomosis. No extrinsic mass is apparent in  this region.  2. Mild mesenteric edema and minimal free pelvic fluid. No specific  signs of metastatic disease.  3. Small supraumbilical hernia containing fat and a knuckle of  transverse colon. No evidence of bowel incarceration.  Electronically Signed  By: Camie Patience M.D.  On: 02/04/2013 12:56   ASSESSMENT:   1. Stage III ovarian cancer S/P cytoreductive surgery and post operative Taxol/Carboplatin. Clinically without evidence of recurrent disease.  2. History of pulmonary embolism postop treated with coumadin for 6 months without subsequent problems.      RECOMMENDATIONS/PLAN:   1. CA-125 results are pending. If normal then defer any additional diagnostic scans and monitor patient clinically at 1 year intervals along with her regular appointments with GynOncology.  2. Removal of the patient's portacath would be reasonable and AnnetteClark will discuss the matter with her surgeon.   All questions were answered. The patient knows to call the clinic with any problems, questions or concerns.    Darrall Dears,  MD 10/26/2013 9:28 PM

## 2013-10-27 ENCOUNTER — Encounter (HOSPITAL_COMMUNITY)
Admission: RE | Admit: 2013-10-27 | Discharge: 2013-10-27 | Disposition: A | Discharge status: Home / Self Care | Payer: BC Managed Care – PPO | Source: Ambulatory Visit | Attending: General Surgery | Admitting: General Surgery

## 2013-10-27 ENCOUNTER — Other Ambulatory Visit: Payer: Self-pay

## 2013-10-27 ENCOUNTER — Encounter (HOSPITAL_COMMUNITY): Payer: Self-pay

## 2013-10-27 ENCOUNTER — Encounter (HOSPITAL_COMMUNITY): Payer: Self-pay | Admitting: Pharmacy Technician

## 2013-10-27 DIAGNOSIS — R9431 Abnormal electrocardiogram [ECG] [EKG]: Secondary | ICD-10-CM | POA: Insufficient documentation

## 2013-10-27 DIAGNOSIS — I493 Ventricular premature depolarization: Secondary | ICD-10-CM | POA: Diagnosis not present

## 2013-10-27 DIAGNOSIS — C569 Malignant neoplasm of unspecified ovary: Secondary | ICD-10-CM | POA: Diagnosis not present

## 2013-10-27 DIAGNOSIS — Z0000 Encounter for general adult medical examination without abnormal findings: Secondary | ICD-10-CM | POA: Diagnosis not present

## 2013-10-27 DIAGNOSIS — K5669 Other intestinal obstruction: Secondary | ICD-10-CM | POA: Insufficient documentation

## 2013-10-27 DIAGNOSIS — I10 Essential (primary) hypertension: Secondary | ICD-10-CM | POA: Diagnosis not present

## 2013-10-27 NOTE — Patient Instructions (Addendum)
Annette Clark  10/27/2013   Your procedure is scheduled on:  11/02/2013  Report to [facility] at  [place]  AM.  Call this number if you have problems the morning of surgery: [phone]   Remember:   Do not eat food or drink liquids after midnight.   Take these medicines the morning of surgery with A SIP OF WATER:  lisinopril   Do not wear jewelry, make-up or nail polish.  Do not wear lotions, powders, or perfumes.   Do not shave 48 hours prior to surgery. Men may shave face and neck.  Do not bring valuables to the hospital.  [facility] is not responsible for any belongings or valuables.               Contacts, dentures or bridgework may not be worn into surgery.  Leave suitcase in the car. After surgery it may be brought to your room.  For patients admitted to the hospital, discharge time is determined by your treatment team.               Patients discharged the day of surgery will not be allowed to drive home.  Name and phone number of your driver: family  Special Instructions: Shower using CHG 2 nights before surgery and the night before surgery.  If you shower the day of surgery use CHG.  Use special wash - you have one bottle of CHG for all showers.  You should use approximately 1/3 of the bottle for each shower.   Please read over the following fact sheets that you were given: Pain Booklet, Coughing and Deep Breathing, Surgical Site Infection Prevention, Anesthesia Post-op Instructions and Care and Recovery After Surgery Incision Care An incision is when a surgeon cuts into your body tissues. After surgery, the incision needs to be cared for properly to prevent infection.  HOME CARE INSTRUCTIONS   Take all medicine as directed by your caregiver. Only take over-the-counter or prescription medicines for pain, discomfort, or fever as directed by your caregiver.  Do not remove your bandage (dressing) or get your incision wet until your surgeon gives you permission. In the  event that your dressing becomes wet, dirty, or starts to smell, change the dressing and call your surgeon for instructions as soon as possible.  Take showers. Do not take tub baths, swim, or do anything that may soak the wound until it is healed.  Resume your normal diet and activities as directed or allowed.  Avoid lifting any weight until you are instructed otherwise.  Use anti-itch antihistamine medicine as directed by your caregiver. The wound may itch when it is healing. Do not pick or scratch at the wound.  Follow up with your caregiver for stitch (suture) or staple removal as directed.  Drink enough fluids to keep your urine clear or pale yellow. SEEK MEDICAL CARE IF:   You have redness, swelling, or increasing pain in the wound that is not controlled with medicine.  You have drainage, blood, or pus coming from the wound that lasts longer than 1 day.  You develop muscle aches, chills, or a general ill feeling.  You notice a bad smell coming from the wound or dressing.  Your wound edges separate after the sutures, staples, or skin adhesive strips have been removed.  You develop persistent nausea or vomiting. SEEK IMMEDIATE MEDICAL CARE IF:   You have a fever.  You develop a rash.  You develop dizzy episodes or faint  while standing.  You have difficulty breathing.  You develop any reaction or side effects to medicine given. MAKE SURE YOU:   Understand these instructions.  Will watch your condition.  Will get help right away if you are not doing well or get worse. Document Released: 07/25/2004 Document Revised: 03/30/2011 Document Reviewed: 03/01/2013 [facility] Patient Information 2015 [place], [place]. This information is not intended to replace advice given to you by your health care provider. Make sure you discuss any questions you have with your health care provider. PATIENT INSTRUCTIONS POST-ANESTHESIA  IMMEDIATELY FOLLOWING SURGERY:  Do not drive or operate  machinery for the first twenty four hours after surgery.  Do not make any important decisions for twenty four hours after surgery or while taking narcotic pain medications or sedatives.  If you develop intractable nausea and vomiting or a severe headache please notify your doctor immediately.  FOLLOW-UP:  Please make an appointment with your surgeon as instructed. You do not need to follow up with anesthesia unless specifically instructed to do so.  WOUND CARE INSTRUCTIONS (if applicable):  Keep a dry clean dressing on the anesthesia/puncture wound site if there is drainage.  Once the wound has quit draining you may leave it open to air.  Generally you should leave the bandage intact for twenty four hours unless there is drainage.  If the epidural site drains for more than 36-48 hours please call the anesthesia department.  QUESTIONS?:  Please feel free to call your physician or the hospital operator if you have any questions, and they will be happy to assist you.

## 2013-10-31 ENCOUNTER — Encounter (HOSPITAL_COMMUNITY): Payer: Self-pay | Admitting: Emergency Medicine

## 2013-10-31 ENCOUNTER — Emergency Department (HOSPITAL_COMMUNITY)
Admission: EM | Admit: 2013-10-31 | Discharge: 2013-10-31 | Disposition: A | Discharge status: Home / Self Care | Payer: BC Managed Care – PPO | Attending: Emergency Medicine | Admitting: Emergency Medicine

## 2013-10-31 DIAGNOSIS — Z86711 Personal history of pulmonary embolism: Secondary | ICD-10-CM | POA: Diagnosis not present

## 2013-10-31 DIAGNOSIS — Z7982 Long term (current) use of aspirin: Secondary | ICD-10-CM | POA: Diagnosis not present

## 2013-10-31 DIAGNOSIS — E669 Obesity, unspecified: Secondary | ICD-10-CM | POA: Diagnosis not present

## 2013-10-31 DIAGNOSIS — Z8543 Personal history of malignant neoplasm of ovary: Secondary | ICD-10-CM | POA: Insufficient documentation

## 2013-10-31 DIAGNOSIS — Z8619 Personal history of other infectious and parasitic diseases: Secondary | ICD-10-CM | POA: Diagnosis not present

## 2013-10-31 DIAGNOSIS — Z90710 Acquired absence of both cervix and uterus: Secondary | ICD-10-CM | POA: Diagnosis not present

## 2013-10-31 DIAGNOSIS — Z79899 Other long term (current) drug therapy: Secondary | ICD-10-CM | POA: Diagnosis not present

## 2013-10-31 DIAGNOSIS — Z9889 Other specified postprocedural states: Secondary | ICD-10-CM | POA: Insufficient documentation

## 2013-10-31 DIAGNOSIS — Z87891 Personal history of nicotine dependence: Secondary | ICD-10-CM | POA: Diagnosis not present

## 2013-10-31 DIAGNOSIS — R112 Nausea with vomiting, unspecified: Secondary | ICD-10-CM | POA: Insufficient documentation

## 2013-10-31 DIAGNOSIS — I10 Essential (primary) hypertension: Secondary | ICD-10-CM | POA: Insufficient documentation

## 2013-10-31 DIAGNOSIS — R509 Fever, unspecified: Secondary | ICD-10-CM | POA: Diagnosis not present

## 2013-10-31 LAB — URINALYSIS, ROUTINE W REFLEX MICROSCOPIC
BILIRUBIN URINE: NEGATIVE
Glucose, UA: NEGATIVE mg/dL
HGB URINE DIPSTICK: NEGATIVE
Leukocytes, UA: NEGATIVE
Nitrite: NEGATIVE
PH: 6 (ref 5.0–8.0)
Protein, ur: NEGATIVE mg/dL
SPECIFIC GRAVITY, URINE: 1.02 (ref 1.005–1.030)
Urobilinogen, UA: 0.2 mg/dL (ref 0.0–1.0)

## 2013-10-31 MED ORDER — PROMETHAZINE HCL 25 MG/ML IJ SOLN
12.5000 mg | Freq: Once | INTRAMUSCULAR | Status: AC | PRN
  Administered 2013-10-31: 12.5 mg via INTRAVENOUS
  Filled 2013-10-31: qty 1

## 2013-10-31 MED ORDER — PROMETHAZINE HCL 25 MG/ML IJ SOLN
INTRAMUSCULAR | Status: AC
  Filled 2013-10-31: qty 1

## 2013-10-31 MED ORDER — ONDANSETRON HCL 4 MG/2ML IJ SOLN
4.0000 mg | Freq: Once | INTRAMUSCULAR | Status: AC
  Administered 2013-10-31: 4 mg via INTRAVENOUS
  Filled 2013-10-31: qty 2

## 2013-10-31 MED ORDER — PROMETHAZINE HCL 25 MG PO TABS: 25.0000 mg | ORAL_TABLET | Freq: Four times a day (QID) | ORAL | Status: DC | PRN

## 2013-10-31 MED ORDER — SODIUM CHLORIDE 0.9 % IV BOLUS (SEPSIS)
500.0000 mL | Freq: Once | INTRAVENOUS | Status: AC
  Administered 2013-10-31: 06:00:00 via INTRAVENOUS

## 2013-10-31 NOTE — Discharge Instructions (Signed)
°  Nausea and Vomiting Nausea means you feel sick to your stomach. Throwing up (vomiting) is a reflex where stomach contents come out of your mouth. HOME CARE   Take medicine as told by your doctor.  Do not force yourself to eat. However, you do need to drink fluids.  If you feel like eating, eat a normal diet as told by your doctor.  Eat rice, wheat, potatoes, bread, lean meats, yogurt, fruits, and vegetables.  Avoid high-fat foods.  Drink enough fluids to keep your urine clear or pale yellow.  Ask your doctor how to replace body fluid losses (rehydrate). Signs of body fluid loss (dehydration) include:  Feeling very thirsty.  Dry lips and mouth.  Feeling dizzy.  Dark pee.  Peeing less than normal.  Feeling confused.  Fast breathing or heart rate. GET HELP RIGHT AWAY IF:   You have blood in your throw up.  You have black or bloody stool (bowel movements).  You have a bad headache or stiff neck.  You feel confused.  You have bad abdominal pain.  You have chest pain or trouble breathing.  You do not urinate at least once every 8 hours.  You have cold, clammy skin.  You keep throwing up after 24 to 48 hours.  You have a fever. MAKE SURE YOU:   Understand these instructions.  Will watch your condition.  Will get help right away if you are not doing well or get worse. Document Released: 06/24/2007 Document Revised: 03/30/2011 Document Reviewed: 06/06/2010 [facility] Patient Information 2015 [place], [place]. This information is not intended to replace advice given to you by your health care provider. Make sure you discuss any questions you have with your health care provider.
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
## 2013-10-31 NOTE — ED Notes (Signed)
Pt c/o nausea and vomiting; pt has had some abdominal pain in center of abdomen

## 2013-10-31 NOTE — ED Notes (Signed)
Pt given discharge instructions. Verbalized understanding. Pt states her nausea has eased but not resolved completely. Encouraged to increase fluids. Pt instructed to be careful getting up or changing positions to avoid dizziness, instructed not to drive. Awaiting husband to return to pick up pt.

## 2013-10-31 NOTE — ED Notes (Signed)
Pt's husband here to pick her up. Discharged home. Pt able to ambulate from dept.

## 2013-11-02 ENCOUNTER — Encounter (HOSPITAL_COMMUNITY): Payer: Self-pay

## 2013-11-02 ENCOUNTER — Encounter (HOSPITAL_COMMUNITY): Payer: BC Managed Care – PPO | Admitting: Anesthesiology

## 2013-11-02 ENCOUNTER — Encounter (HOSPITAL_COMMUNITY)
Admission: RE | Disposition: A | Discharge status: Home / Self Care | Payer: Self-pay | Source: Ambulatory Visit | Attending: General Surgery

## 2013-11-02 ENCOUNTER — Ambulatory Visit (HOSPITAL_COMMUNITY): Payer: BC Managed Care – PPO | Admitting: Anesthesiology

## 2013-11-02 ENCOUNTER — Ambulatory Visit (HOSPITAL_COMMUNITY)
Admission: RE | Admit: 2013-11-02 | Discharge: 2013-11-02 | Disposition: A | Discharge status: Home / Self Care | Payer: BC Managed Care – PPO | Source: Ambulatory Visit | Attending: General Surgery | Admitting: General Surgery

## 2013-11-02 DIAGNOSIS — Z9221 Personal history of antineoplastic chemotherapy: Secondary | ICD-10-CM | POA: Diagnosis not present

## 2013-11-02 DIAGNOSIS — K56609 Unspecified intestinal obstruction, unspecified as to partial versus complete obstruction: Secondary | ICD-10-CM

## 2013-11-02 DIAGNOSIS — R1032 Left lower quadrant pain: Secondary | ICD-10-CM

## 2013-11-02 DIAGNOSIS — Z452 Encounter for adjustment and management of vascular access device: Secondary | ICD-10-CM | POA: Insufficient documentation

## 2013-11-02 DIAGNOSIS — E669 Obesity, unspecified: Secondary | ICD-10-CM | POA: Insufficient documentation

## 2013-11-02 DIAGNOSIS — I10 Essential (primary) hypertension: Secondary | ICD-10-CM | POA: Insufficient documentation

## 2013-11-02 DIAGNOSIS — C569 Malignant neoplasm of unspecified ovary: Secondary | ICD-10-CM

## 2013-11-02 DIAGNOSIS — Z8543 Personal history of malignant neoplasm of ovary: Secondary | ICD-10-CM | POA: Insufficient documentation

## 2013-11-02 DIAGNOSIS — Z6841 Body Mass Index (BMI) 40.0 and over, adult: Secondary | ICD-10-CM | POA: Diagnosis not present

## 2013-11-02 DIAGNOSIS — Z95828 Presence of other vascular implants and grafts: Secondary | ICD-10-CM

## 2013-11-02 HISTORY — PX: PORT-A-CATH REMOVAL: SHX5289

## 2013-11-02 SURGERY — REMOVAL PORT-A-CATH
Anesthesia: Monitor Anesthesia Care | Site: Chest | Laterality: Right

## 2013-11-02 MED ORDER — CEFAZOLIN SODIUM-DEXTROSE 2-3 GM-% IV SOLR
INTRAVENOUS | Status: AC
  Filled 2013-11-02: qty 50

## 2013-11-02 MED ORDER — SODIUM CHLORIDE 0.9 % IR SOLN
Status: DC | PRN
  Administered 2013-11-02: 1000 mL

## 2013-11-02 MED ORDER — BACITRACIN-NEOMYCIN-POLYMYXIN 400-5-5000 EX OINT
TOPICAL_OINTMENT | CUTANEOUS | Status: AC
  Filled 2013-11-02: qty 1

## 2013-11-02 MED ORDER — WATER FOR IRRIGATION, STERILE IR SOLN
Status: DC | PRN
  Administered 2013-11-02: 1000 mL

## 2013-11-02 MED ORDER — ONDANSETRON HCL 4 MG/2ML IJ SOLN
4.0000 mg | Freq: Once | INTRAMUSCULAR | Status: AC
  Administered 2013-11-02: 4 mg via INTRAVENOUS

## 2013-11-02 MED ORDER — MIDAZOLAM HCL 2 MG/2ML IJ SOLN
INTRAMUSCULAR | Status: AC
  Filled 2013-11-02: qty 2

## 2013-11-02 MED ORDER — ONDANSETRON HCL 4 MG/2ML IJ SOLN
INTRAMUSCULAR | Status: AC
  Filled 2013-11-02: qty 2

## 2013-11-02 MED ORDER — ONDANSETRON HCL 4 MG/2ML IJ SOLN: 4.0000 mg | Freq: Once | INTRAMUSCULAR | Status: DC | PRN

## 2013-11-02 MED ORDER — FENTANYL CITRATE 0.05 MG/ML IJ SOLN
INTRAMUSCULAR | Status: DC | PRN
  Administered 2013-11-02 (×2): 25 ug via INTRAVENOUS
  Administered 2013-11-02: 50 ug via INTRAVENOUS

## 2013-11-02 MED ORDER — FENTANYL CITRATE 0.05 MG/ML IJ SOLN
INTRAMUSCULAR | Status: DC | PRN
Start: 2013-11-02 — End: 2013-11-02

## 2013-11-02 MED ORDER — BACITRACIN-NEOMYCIN-POLYMYXIN 400-5-5000 EX OINT
TOPICAL_OINTMENT | CUTANEOUS | Status: DC | PRN
  Administered 2013-11-02: 1 via TOPICAL

## 2013-11-02 MED ORDER — PROPOFOL 10 MG/ML IV EMUL
INTRAVENOUS | Status: AC
  Filled 2013-11-02: qty 20

## 2013-11-02 MED ORDER — FENTANYL CITRATE 0.05 MG/ML IJ SOLN: 25.0000 ug | INTRAMUSCULAR | Status: DC | PRN

## 2013-11-02 MED ORDER — NEOSTIGMINE METHYLSULFATE 10 MG/10ML IV SOLN
INTRAVENOUS | Status: AC
  Filled 2013-11-02: qty 1

## 2013-11-02 MED ORDER — CEFAZOLIN SODIUM 1-5 GM-% IV SOLN
INTRAVENOUS | Status: AC
  Filled 2013-11-02: qty 50

## 2013-11-02 MED ORDER — LACTATED RINGERS IV SOLN
INTRAVENOUS | Status: DC
  Administered 2013-11-02: 07:00:00 via INTRAVENOUS

## 2013-11-02 MED ORDER — MIDAZOLAM HCL 2 MG/2ML IJ SOLN
1.0000 mg | INTRAMUSCULAR | Status: DC | PRN
  Administered 2013-11-02: 2 mg via INTRAVENOUS

## 2013-11-02 MED ORDER — FENTANYL CITRATE 0.05 MG/ML IJ SOLN
INTRAMUSCULAR | Status: AC
  Filled 2013-11-02: qty 2

## 2013-11-02 MED ORDER — FENTANYL CITRATE 0.05 MG/ML IJ SOLN
INTRAMUSCULAR | Status: AC
Start: 2013-11-02 — End: 2013-11-02
  Filled 2013-11-02: qty 2

## 2013-11-02 MED ORDER — DEXTROSE 5 % IV SOLN
3.0000 g | Freq: Once | INTRAVENOUS | Status: AC
  Administered 2013-11-02: 3 g via INTRAVENOUS
  Filled 2013-11-02: qty 3000

## 2013-11-02 MED ORDER — CIPROFLOXACIN HCL 500 MG PO TABS: 500.0000 mg | ORAL_TABLET | Freq: Two times a day (BID) | ORAL | Status: DC

## 2013-11-02 MED ORDER — LIDOCAINE HCL (PF) 1 % IJ SOLN
INTRAMUSCULAR | Status: DC | PRN
  Administered 2013-11-02: 3 mL

## 2013-11-02 MED ORDER — LIDOCAINE HCL (CARDIAC) 10 MG/ML IV SOLN
INTRAVENOUS | Status: DC | PRN
  Administered 2013-11-02 (×2): 10 mg via INTRAVENOUS

## 2013-11-02 MED ORDER — FENTANYL CITRATE 0.05 MG/ML IJ SOLN
25.0000 ug | INTRAMUSCULAR | Status: AC
  Administered 2013-11-02 (×2): 25 ug via INTRAVENOUS

## 2013-11-02 MED ORDER — LIDOCAINE HCL (PF) 1 % IJ SOLN
INTRAMUSCULAR | Status: AC
  Filled 2013-11-02: qty 5

## 2013-11-02 MED ORDER — PROPOFOL INFUSION 10 MG/ML OPTIME
INTRAVENOUS | Status: DC | PRN
Start: 2013-11-02 — End: 2013-11-02
  Administered 2013-11-02: 125 ug/kg/min via INTRAVENOUS

## 2013-11-02 MED ORDER — ATROPINE SULFATE 0.4 MG/ML IJ SOLN
INTRAMUSCULAR | Status: AC
  Filled 2013-11-02: qty 2

## 2013-11-02 MED ORDER — MIDAZOLAM HCL 5 MG/5ML IJ SOLN
INTRAMUSCULAR | Status: DC | PRN
  Administered 2013-11-02 (×2): 0.5 mg via INTRAVENOUS
  Administered 2013-11-02: 1 mg via INTRAVENOUS

## 2013-11-02 MED ORDER — LIDOCAINE HCL (PF) 1 % IJ SOLN
INTRAMUSCULAR | Status: AC
  Filled 2013-11-02: qty 30

## 2013-11-02 SURGICAL SUPPLY — 30 items
BAG HAMPER (MISCELLANEOUS) ×3 IMPLANT
CLOSURE WOUND 1/4 X3 (GAUZE/BANDAGES/DRESSINGS) ×1
CLOTH BEACON ORANGE TIMEOUT ST (SAFETY) ×3 IMPLANT
COVER LIGHT HANDLE STERIS (MISCELLANEOUS) ×6 IMPLANT
DRSG TEGADERM 2-3/8X2-3/4 SM (GAUZE/BANDAGES/DRESSINGS) ×3 IMPLANT
ELECT REM PT RETURN 9FT ADLT (ELECTROSURGICAL) ×3
ELECTRODE REM PT RTRN 9FT ADLT (ELECTROSURGICAL) ×1 IMPLANT
GLOVE BIOGEL PI IND STRL 7.0 (GLOVE) IMPLANT
GLOVE BIOGEL PI INDICATOR 7.0 (GLOVE) ×4
GLOVE ECLIPSE 6.5 STRL STRAW (GLOVE) ×4 IMPLANT
GLOVE SKINSENSE NS SZ7.0 (GLOVE) ×2
GLOVE SKINSENSE STRL SZ7.0 (GLOVE) ×1 IMPLANT
GOWN STRL REUS W/TWL LRG LVL3 (GOWN DISPOSABLE) ×6 IMPLANT
KIT ROOM TURNOVER APOR (KITS) ×3 IMPLANT
MANIFOLD NEPTUNE II (INSTRUMENTS) ×3 IMPLANT
NDL HYPO 25X1 1.5 SAFETY (NEEDLE) IMPLANT
NEEDLE HYPO 25X1 1.5 SAFETY (NEEDLE) ×3 IMPLANT
NS IRRIG 1000ML POUR BTL (IV SOLUTION) ×3 IMPLANT
PACK MINOR (CUSTOM PROCEDURE TRAY) ×3 IMPLANT
PAD ARMBOARD 7.5X6 YLW CONV (MISCELLANEOUS) ×3 IMPLANT
SET BASIN LINEN APH (SET/KITS/TRAYS/PACK) ×3 IMPLANT
SPONGE GAUZE 2X2 8PLY STER LF (GAUZE/BANDAGES/DRESSINGS) ×1
SPONGE GAUZE 2X2 8PLY STRL LF (GAUZE/BANDAGES/DRESSINGS) ×2 IMPLANT
STRIP CLOSURE SKIN 1/4X3 (GAUZE/BANDAGES/DRESSINGS) ×2 IMPLANT
SUT VIC AB 3-0 SH 27 (SUTURE) ×3
SUT VIC AB 3-0 SH 27X BRD (SUTURE) IMPLANT
SUT VIC AB 4-0 PS2 27 (SUTURE) ×3 IMPLANT
SUT VICRYL AB 3 0 TIES (SUTURE) ×2 IMPLANT
SYR CONTROL 10ML LL (SYRINGE) ×2 IMPLANT
WATER STERILE IRR 1000ML POUR (IV SOLUTION) ×6 IMPLANT

## 2013-11-02 NOTE — Brief Op Note (Signed)
11/02/2013  8:26 AM  PATIENT:  Binnie Rail  63 y.o. female  PRE-OPERATIVE DIAGNOSIS:  treatment completed  POST-OPERATIVE DIAGNOSIS:  treatment completed  PROCEDURE:  Procedure(s): REMOVAL PORT-A-CATH  (Right)  SURGEON:  Surgeon(s) and Role:    * Scherry Ran, MD - Primary  PHYSICIAN ASSISTANT:   ASSISTANTS: none   ANESTHESIA:   IV sedation  EBL:  Total I/O In: 400 [I.V.:400] Out: 3 [Blood:3]  BLOOD ADMINISTERED:none  DRAINS: none   LOCAL MEDICATIONS USED:  XYLOCAINE   SPECIMEN:  Source of Specimen:  porta cath (not sent to path)  DISPOSITION OF SPECIMEN:  N/A  COUNTS:  YES  TOURNIQUET:  * No tourniquets in log *  DICTATION: .Other Dictation: Dictation Number OR dict. #  [id number].  PLAN OF CARE: Discharge to home after PACU  PATIENT DISPOSITION:  PACU - hemodynamically stable.   Delay start of Pharmacological VTE agent (>24hrs) due to surgical blood loss or risk of bleeding: not applicable

## 2013-11-02 NOTE — Progress Notes (Signed)
Patient states she had been nauseated and visited the ER on 10/31/13. Patient presents with low grade temperature 99.2, no nausea today Dr. Patsey Berthold made aware no further action required.

## 2013-11-02 NOTE — Anesthesia Preprocedure Evaluation (Signed)
Anesthesia Evaluation  Patient identified by MRN, date of birth, ID band Patient awake    Reviewed: Allergy & Precautions, H&P , NPO status , Patient's Chart, lab work & pertinent test results  Airway Mallampati: II TM Distance: >3 FB     Dental  (+) Teeth Intact   Pulmonary former smoker, PE breath sounds clear to auscultation        Cardiovascular hypertension, Pt. on medications - anginaRhythm:Regular Rate:Normal     Neuro/Psych    GI/Hepatic negative GI ROS,   Endo/Other  Morbid obesity  Renal/GU      Musculoskeletal   Abdominal   Peds  Hematology   Anesthesia Other Findings   Reproductive/Obstetrics                           Anesthesia Physical Anesthesia Plan  ASA: III  Anesthesia Plan: MAC   Post-op Pain Management:    Induction: Intravenous  Airway Management Planned: Simple Face Mask  Additional Equipment:   Intra-op Plan:   Post-operative Plan:   Informed Consent: I have reviewed the patients History and Physical, chart, labs and discussed the procedure including the risks, benefits and alternatives for the proposed anesthesia with the patient or authorized representative who has indicated his/her understanding and acceptance.     Plan Discussed with:   Anesthesia Plan Comments:         Anesthesia Quick Evaluation

## 2013-11-02 NOTE — Anesthesia Procedure Notes (Signed)
Procedure Name: MAC Date/Time: 11/02/2013 7:45 AM Performed by: Vista Deck Pre-anesthesia Checklist: Patient identified, Emergency Drugs available, Suction available, Timeout performed and Patient being monitored Patient Re-evaluated:Patient Re-evaluated prior to inductionOxygen Delivery Method: Non-rebreather mask

## 2013-11-02 NOTE — Discharge Instructions (Signed)
Clear Liquid Diet  A clear liquid diet is a short-term diet. It is made up of liquids or solids that become liquids at room temperature. You should be able to see through the liquid.  WHAT CAN I HAVE?  You may have any of the following if your doctor says they are okay:  · Vegetable juices or fruit juices that do not have pulp.  · Coffee.  · Tea.  · Soda.  · Clear broth or strained broth-based soups.  · High-protein gelatins and flavored gelatins.  · Sugar and honey.  · Ices or frozen ice pops that do not have milk.  If you are not sure whether you can have a certain liquid, ask your doctor. You may also ask your doctor about other clear liquid options.  Document Released: 12/19/2007 Document Revised: 01/10/2013 Document Reviewed: 12/02/2012  [employer]® Patient Information ©2015 [employer]. This information is not intended to replace advice given to you by your health care provider. Make sure you discuss any questions you have with your health care provider.

## 2013-11-02 NOTE — Anesthesia Postprocedure Evaluation (Signed)
Anesthesia Post Note  Patient: Annette Clark  Procedure(s) Performed: Procedure(s) (LRB): REMOVAL PORT-A-CATH  (Right)  Anesthesia type: MAC  Patient location: PACU  Post pain: Pain level controlled  Post assessment: Post-op Vital signs reviewed, Patient's Cardiovascular Status Stable, Respiratory Function Stable, Patent Airway, No signs of Nausea or vomiting and Pain level controlled  Last Vitals:  Filed Vitals:   11/02/13 0845  BP: 110/69  Pulse: 59  Temp:   Resp: 16    Post vital signs: Reviewed and stable  Level of consciousness: awake and alert   Complications: No apparent anesthesia complications

## 2013-11-02 NOTE — Transfer of Care (Signed)
Immediate Anesthesia Transfer of Care Note  Patient: Annette Clark  Procedure(s) Performed: Procedure(s) (LRB): REMOVAL PORT-A-CATH  (Right)  Patient Location: PACU  Anesthesia Type: MAC  Level of Consciousness: awake  Airway & Oxygen Therapy: Patient Spontanous Breathing. Nasal cannula  Post-op Assessment: Report given to PACU RN, Post -op Vital signs reviewed and stable and Patient moving all extremities  Post vital signs: Reviewed and stable  Complications: No apparent anesthesia complications

## 2013-11-02 NOTE — Progress Notes (Signed)
32 yr. Old W. Female for removal of porta cath She has completed therapy.  Procedure and risks explained and informed consent obtained.  No clinical change since H&P. Dict # [id number].    Filed Vitals:     11/02/13 0725    BP:  104/61   Temp:  99.2   Resp:  16   O2 sat 95%

## 2013-11-02 NOTE — Op Note (Signed)
Annette Clark, KILROY                ACCOUNT NO.:  [id number]  MEDICAL RECORD NO.:  [id number]  LOCATION:  [facility]                          FACILITY:  [facility]  PHYSICIAN:  Felicie Morn, M.D. DATE OF BIRTH:  [birth year]  DATE OF PROCEDURE: DATE OF DISCHARGE:                              OPERATIVE REPORT   POSTOPERATIVE DIAGNOSIS:  Status post treatment of ovarian carcinoma.  POSTOPERATIVE DIAGNOSIS:  Status post treatment of ovarian carcinoma.  NOTE:  This is 63 year old obese white female who had a Port-A-Cath placed for chemotherapy for treatment of ovarian carcinoma.  She had completed this treatment and we planned to remove her Port-A-Cath via the outpatient department.  She also just prior to coming to the hospital, she later informed me that she had been to the emergency room with some fever and abdominal discomfort.  This has subsided.  I suspected this is a flare-up of diverticular disease which she has had in the past.  I will discharge her on some Cipro and a restrictive diet.  At present, she is afebrile and her belly feels, it is not very tender at all.  However, I will treat her for possible recurrent diverticular disease as she is morbidly obese, and it is possible that she is having another flare-up with this.  WOUND CLASSIFICATION:  Clean.  SPECIMEN:  Port-A-Cath and infusion device (this was not sent to pathology).  GROSS OPERATIVE FINDINGS:  Pseudo capsule.  Around the Port-A-Cath, there was no sign of any infection or any other problems.  TECHNIQUE:  The patient was placed in supine position and after the adequate administration of IV sedation, her right hemithorax was prepped with DuraPrep solution and draped in the usual manner.  Using 1% Xylocaine without epinephrine approximately 3 mL were used.  An incision was made over the palpable infusion device, which was removed without a problem.  The pseudocyst where the silastic catheter was clamped and ligated.   We then checked for hemostasis.  There was no problems and she was irrigated with some normal saline solution.  The skin was closed subcuticularly with a 4-0 Polysorb and the skin with subcuticular 5-0 Polysorb.  A quarter-inch Steri-Strips with Neosporin and a 2 x 2 sterile dressing was applied.  Prior to closure, all sponge, needle, and instrument counts were found to be correct.  Estimated blood loss was minimal.  The patient received approximately 400 mL of crystalloids intraoperatively.  No drains were placed.  There were no complications.     Felicie Morn, M.D.     WB/MEDQ  D:  11/02/2013  T:  11/02/2013  Job:  [id number]  cc:   Dr. Hilma Favors

## 2013-11-03 ENCOUNTER — Encounter (HOSPITAL_COMMUNITY): Payer: Self-pay | Admitting: General Surgery

## 2013-11-09 ENCOUNTER — Encounter: Payer: Self-pay | Admitting: Oncology

## 2013-11-13 LAB — CA 125

## 2013-11-20 ENCOUNTER — Ambulatory Visit (HOSPITAL_COMMUNITY)
Admission: RE | Admit: 2013-11-20 | Discharge: 2013-11-20 | Disposition: A | Discharge status: Home / Self Care | Payer: BC Managed Care – PPO | Source: Ambulatory Visit | Attending: Family Medicine | Admitting: Family Medicine

## 2013-11-20 DIAGNOSIS — Z1231 Encounter for screening mammogram for malignant neoplasm of breast: Secondary | ICD-10-CM | POA: Insufficient documentation

## 2013-11-20 DIAGNOSIS — Z139 Encounter for screening, unspecified: Secondary | ICD-10-CM

## 2013-11-21 ENCOUNTER — Encounter (HOSPITAL_COMMUNITY): Payer: BC Managed Care – PPO

## 2014-07-26 ENCOUNTER — Ambulatory Visit (INDEPENDENT_AMBULATORY_CARE_PROVIDER_SITE_OTHER): Payer: BLUE CROSS/BLUE SHIELD | Admitting: Otolaryngology

## 2014-07-26 DIAGNOSIS — R490 Dysphonia: Secondary | ICD-10-CM | POA: Diagnosis not present

## 2014-07-26 DIAGNOSIS — J382 Nodules of vocal cords: Secondary | ICD-10-CM

## 2014-07-30 ENCOUNTER — Other Ambulatory Visit: Payer: Self-pay | Admitting: Otolaryngology

## 2014-08-03 ENCOUNTER — Ambulatory Visit: Payer: Self-pay | Admitting: Gynecology

## 2014-08-09 ENCOUNTER — Encounter (HOSPITAL_BASED_OUTPATIENT_CLINIC_OR_DEPARTMENT_OTHER): Payer: Self-pay | Admitting: *Deleted

## 2014-08-14 ENCOUNTER — Encounter (HOSPITAL_BASED_OUTPATIENT_CLINIC_OR_DEPARTMENT_OTHER): Payer: Self-pay | Admitting: *Deleted

## 2014-08-14 NOTE — Progress Notes (Signed)
Pt denies any cardiac history, denies chest pain or sob. Called Dr. Delanna Ahmadi office, pt was seen there in January and had an EKG and full physical done. Requested copy of EKG and that OV note faxed to [place].

## 2014-08-14 NOTE — Progress Notes (Signed)
Anesthesia Chart Review: SAME DAY WORK-UP.  Patient is a 64 year old female scheduled for micro direct laryngoscopy with biopsy tomorrow by Dr. Benjamine Mola. DX: Right vocal cord lesion. Patient reports she was moved from a day surgical center due to her weight and nature of the procedure. BMI is 50.95 consistent with super obesity. PCP is Dr. Sharilyn Sites, last CPE 01/31/14 with EKG. Weight loss and exercise recommended, otherwise no new testing ordered.  History includes former smoker, stage IIIC ovarian cancer s/p hysterectomy with BSO and scolon resection '06 and chemotherapy, HTN, post-operative PE '06, small bowel obstruction treated medically '15, Port-a-cath removal 10/2013.  Meds include lisinopril.  01/31/14 EKG (Dr. Hilma Favors): SR, cannot rule out anteror infarct (age undetermined), occasional PVC. Small anterolateral Q waves have resolved since 10/27/13. She denied chest pain to the phone interviewing PAT RN.   She is a same day work-up, so she will be further evaluated by her assigned anesthesiologist on the day of surgery to discuss the definitive anesthesia plan.   [facility]/Anesthesiology Phone [phone] 08/14/2014 11:26 AM

## 2014-08-15 ENCOUNTER — Ambulatory Visit (HOSPITAL_COMMUNITY): Payer: BLUE CROSS/BLUE SHIELD | Admitting: Vascular Surgery

## 2014-08-15 ENCOUNTER — Ambulatory Visit (HOSPITAL_COMMUNITY)
Admission: RE | Admit: 2014-08-15 | Discharge: 2014-08-15 | Disposition: A | Discharge status: Home / Self Care | Payer: BLUE CROSS/BLUE SHIELD | Source: Ambulatory Visit | Attending: Otolaryngology | Admitting: Otolaryngology

## 2014-08-15 ENCOUNTER — Encounter (HOSPITAL_COMMUNITY): Payer: Self-pay | Admitting: Certified Registered Nurse Anesthetist

## 2014-08-15 ENCOUNTER — Encounter (HOSPITAL_COMMUNITY)
Admission: RE | Disposition: A | Discharge status: Home / Self Care | Payer: Self-pay | Source: Ambulatory Visit | Attending: Otolaryngology

## 2014-08-15 DIAGNOSIS — R490 Dysphonia: Secondary | ICD-10-CM | POA: Diagnosis present

## 2014-08-15 DIAGNOSIS — Z86711 Personal history of pulmonary embolism: Secondary | ICD-10-CM | POA: Insufficient documentation

## 2014-08-15 DIAGNOSIS — Z6841 Body Mass Index (BMI) 40.0 and over, adult: Secondary | ICD-10-CM | POA: Diagnosis not present

## 2014-08-15 DIAGNOSIS — Z8543 Personal history of malignant neoplasm of ovary: Secondary | ICD-10-CM | POA: Insufficient documentation

## 2014-08-15 DIAGNOSIS — Z87891 Personal history of nicotine dependence: Secondary | ICD-10-CM | POA: Diagnosis not present

## 2014-08-15 DIAGNOSIS — I10 Essential (primary) hypertension: Secondary | ICD-10-CM | POA: Diagnosis not present

## 2014-08-15 DIAGNOSIS — Z79899 Other long term (current) drug therapy: Secondary | ICD-10-CM | POA: Diagnosis not present

## 2014-08-15 DIAGNOSIS — J383 Other diseases of vocal cords: Secondary | ICD-10-CM | POA: Insufficient documentation

## 2014-08-15 HISTORY — DX: Anemia, unspecified: D64.9

## 2014-08-15 HISTORY — PX: DIRECT LARYNGOSCOPY: SHX5326

## 2014-08-15 LAB — BASIC METABOLIC PANEL
ANION GAP: 9 (ref 5–15)
BUN: 14 mg/dL (ref 6–20)
CO2: 27 mmol/L (ref 22–32)
Calcium: 9 mg/dL (ref 8.9–10.3)
Chloride: 102 mmol/L (ref 101–111)
Creatinine, Ser: 0.65 mg/dL (ref 0.44–1.00)
GFR calc Af Amer: >60 mL/min (ref >60)
GFR calc non Af Amer: >60 mL/min (ref >60)
GLUCOSE: 103 mg/dL — AB (ref 65–99)
Potassium: 3.9 mmol/L (ref 3.5–5.1)
Sodium: 138 mmol/L (ref 135–145)

## 2014-08-15 LAB — CBC
HCT: 41.7 % (ref 36.0–46.0)
Hemoglobin: 13.5 g/dL (ref 12.0–15.0)
MCH: 28.3 pg (ref 26.0–34.0)
MCHC: 32.4 g/dL (ref 30.0–36.0)
MCV: 87.4 fL (ref 78.0–100.0)
PLATELETS: 218 10*3/uL (ref 150–400)
RBC: 4.77 MIL/uL (ref 3.87–5.11)
RDW: 13.9 % (ref 11.5–15.5)
WBC: 5.5 10*3/uL (ref 4.0–10.5)

## 2014-08-15 SURGERY — LARYNGOSCOPY, DIRECT
Anesthesia: General | Site: Mouth

## 2014-08-15 MED ORDER — ONDANSETRON HCL 4 MG/2ML IJ SOLN
INTRAMUSCULAR | Status: AC
  Filled 2014-08-15: qty 2

## 2014-08-15 MED ORDER — EPINEPHRINE HCL (NASAL) 0.1 % NA SOLN
NASAL | Status: DC | PRN
  Administered 2014-08-15: 30 mL via NASAL

## 2014-08-15 MED ORDER — LIDOCAINE HCL (CARDIAC) 20 MG/ML IV SOLN
INTRAVENOUS | Status: DC | PRN
  Administered 2014-08-15: 60 mg via INTRAVENOUS

## 2014-08-15 MED ORDER — FENTANYL CITRATE (PF) 100 MCG/2ML IJ SOLN
INTRAMUSCULAR | Status: DC | PRN
  Administered 2014-08-15 (×4): 50 ug via INTRAVENOUS

## 2014-08-15 MED ORDER — LIDOCAINE HCL (CARDIAC) 20 MG/ML IV SOLN
INTRAVENOUS | Status: AC
  Filled 2014-08-15: qty 5

## 2014-08-15 MED ORDER — SUCCINYLCHOLINE CHLORIDE 20 MG/ML IJ SOLN
INTRAMUSCULAR | Status: DC | PRN
  Administered 2014-08-15: 40 mg via INTRAVENOUS
  Administered 2014-08-15: 60 mg via INTRAVENOUS

## 2014-08-15 MED ORDER — OXYMETAZOLINE HCL 0.05 % NA SOLN
NASAL | Status: DC | PRN
  Administered 2014-08-15: 1

## 2014-08-15 MED ORDER — LACTATED RINGERS IV SOLN
INTRAVENOUS | Status: DC | PRN
  Administered 2014-08-15: 08:00:00 via INTRAVENOUS

## 2014-08-15 MED ORDER — EPINEPHRINE HCL (NASAL) 0.1 % NA SOLN
NASAL | Status: AC
  Filled 2014-08-15: qty 30

## 2014-08-15 MED ORDER — FENTANYL CITRATE (PF) 250 MCG/5ML IJ SOLN
INTRAMUSCULAR | Status: AC
  Filled 2014-08-15: qty 5

## 2014-08-15 MED ORDER — MIDAZOLAM HCL 2 MG/2ML IJ SOLN
INTRAMUSCULAR | Status: AC
  Filled 2014-08-15: qty 2

## 2014-08-15 MED ORDER — OXYCODONE-ACETAMINOPHEN 5-325 MG PO TABS: 1.0000 | ORAL_TABLET | ORAL | Status: DC | PRN

## 2014-08-15 MED ORDER — DEXAMETHASONE SODIUM PHOSPHATE 4 MG/ML IJ SOLN
INTRAMUSCULAR | Status: DC | PRN
  Administered 2014-08-15: 8 mg via INTRAVENOUS

## 2014-08-15 MED ORDER — ROCURONIUM BROMIDE 50 MG/5ML IV SOLN
INTRAVENOUS | Status: AC
  Filled 2014-08-15: qty 1

## 2014-08-15 MED ORDER — PROPOFOL 10 MG/ML IV BOLUS
INTRAVENOUS | Status: AC
  Filled 2014-08-15: qty 20

## 2014-08-15 MED ORDER — LIDOCAINE HCL 4 % MT SOLN
OROMUCOSAL | Status: DC | PRN
  Administered 2014-08-15: 4 mL via TOPICAL

## 2014-08-15 MED ORDER — MIDAZOLAM HCL 5 MG/5ML IJ SOLN
INTRAMUSCULAR | Status: DC | PRN
  Administered 2014-08-15: 2 mg via INTRAVENOUS

## 2014-08-15 MED ORDER — PROPOFOL 10 MG/ML IV BOLUS
INTRAVENOUS | Status: DC | PRN
  Administered 2014-08-15: 70 mg via INTRAVENOUS
  Administered 2014-08-15: 200 mg via INTRAVENOUS

## 2014-08-15 MED ORDER — EPHEDRINE SULFATE 50 MG/ML IJ SOLN
INTRAMUSCULAR | Status: DC | PRN
  Administered 2014-08-15: 5 mg via INTRAVENOUS

## 2014-08-15 MED ORDER — OMEPRAZOLE 20 MG PO CPDR: 20.0000 mg | DELAYED_RELEASE_CAPSULE | Freq: Every day | ORAL | Status: DC

## 2014-08-15 MED ORDER — 0.9 % SODIUM CHLORIDE (POUR BTL) OPTIME
TOPICAL | Status: DC | PRN
  Administered 2014-08-15: 1000 mL

## 2014-08-15 MED ORDER — ONDANSETRON HCL 4 MG/2ML IJ SOLN
INTRAMUSCULAR | Status: DC | PRN
  Administered 2014-08-15: 4 mg via INTRAVENOUS

## 2014-08-15 MED ORDER — LACTATED RINGERS IV SOLN
INTRAVENOUS | Status: DC
  Administered 2014-08-15: 08:00:00 via INTRAVENOUS

## 2014-08-15 SURGICAL SUPPLY — 22 items
CANISTER SUCTION 2500CC (MISCELLANEOUS) ×3 IMPLANT
CONT SPEC 4OZ CLIKSEAL STRL BL (MISCELLANEOUS) ×2 IMPLANT
COVER TABLE BACK 60X90 (DRAPES) ×3 IMPLANT
DRAPE PROXIMA HALF (DRAPES) ×3 IMPLANT
GLOVE BIO SURGEON STRL SZ7.5 (GLOVE) ×3 IMPLANT
GLOVE BIOGEL PI IND STRL 7.0 (GLOVE) IMPLANT
GLOVE BIOGEL PI INDICATOR 7.0 (GLOVE) ×2
GOWN STRL REUS W/ TWL LRG LVL3 (GOWN DISPOSABLE) ×2 IMPLANT
GOWN STRL REUS W/TWL LRG LVL3 (GOWN DISPOSABLE) ×6
GUARD TEETH (MISCELLANEOUS) ×3 IMPLANT
KIT BASIN OR (CUSTOM PROCEDURE TRAY) ×3 IMPLANT
KIT ROOM TURNOVER OR (KITS) ×3 IMPLANT
NDL HYPO 25GX1X1/2 BEV (NEEDLE) IMPLANT
NEEDLE HYPO 25GX1X1/2 BEV (NEEDLE) IMPLANT
NS IRRIG 1000ML POUR BTL (IV SOLUTION) ×3 IMPLANT
PAD ARMBOARD 7.5X6 YLW CONV (MISCELLANEOUS) ×6 IMPLANT
PATTIES SURGICAL .5 X1 (DISPOSABLE) ×3 IMPLANT
SOLUTION ANTI FOG 6CC (MISCELLANEOUS) ×2 IMPLANT
SPONGE GAUZE 4X4 12PLY STER LF (GAUZE/BANDAGES/DRESSINGS) ×3 IMPLANT
TOWEL OR 17X24 6PK STRL BLUE (TOWEL DISPOSABLE) ×2 IMPLANT
TUBE CONNECTING 12'X1/4 (SUCTIONS) ×1
TUBE CONNECTING 12X1/4 (SUCTIONS) ×2 IMPLANT

## 2014-08-15 NOTE — Transfer of Care (Signed)
Immediate Anesthesia Transfer of Care Note  Patient: Annette Clark  Procedure(s) Performed: Procedure(s): MICRO DIRECT LARYNGOSCOPY WITH BIOPSY (N/A)  Patient Location: PACU  Anesthesia Type:General  Level of Consciousness: awake, alert , oriented and patient cooperative  Airway & Oxygen Therapy: Patient Spontanous Breathing and Patient connected to nasal cannula oxygen  Post-op Assessment: Report given to RN and Post -op Vital signs reviewed and stable  Post vital signs: Reviewed and stable  Last Vitals:  Filed Vitals:   08/15/14 0937  BP: 108/42  Pulse: 72  Temp: 36.1 C  Resp: 6    Complications: No apparent anesthesia complications

## 2014-08-15 NOTE — H&P (Signed)
Cc: Chronic hoarseness  HPI: The patient is a 64 year old female who presents today complaining of frequent recurrent hoarseness for the past several years.  Her current episode started in March.  She complains of chronic raspy voice. She has significant difficulty singing in her choir at church.  She also complains of frequent cough and metallic taste in her mouth.  She has no previous history of gastroesophageal reflux.  She also denies any previous ENT surgery. The patient quit the use of tobacco a long time ago. She denies any significant recent weight loss.   The patient's review of systems (constitutional, eyes, ENT, cardiovascular, respiratory, GI, musculoskeletal, skin, neurologic, psychiatric, endocrine, hematologic, allergic) is noted in the ROS questionnaire.  It is reviewed with the patient.  Family health history: Colon cancer.   Major events: Hysterectomy, colon resection, pulmonary embolus, blood transfusion, left ankle surgery.   Ongoing medical problems: Ovarian cancer, hypertension  Social history: Denies using tobacco currently.  Exam General: Communicates without difficulty, well nourished, no acute distress. Head: Normocephalic, no evidence injury, no tenderness, facial buttresses intact without stepoff. Eyes: PERRL, EOMI. No scleral icterus, conjunctivae clear. Neuro: CN II exam reveals vision grossly intact.  No nystagmus at any point of gaze. Ears: Auricles well formed without lesions.  Ear canals are intact without mass or lesion.  No erythema or edema is appreciated.  The TMs are intact without fluid. Nose: External evaluation reveals normal support and skin without lesions.  Dorsum is intact.  Anterior rhinoscopy reveals healthy pink mucosa over anterior aspect of inferior turbinates and intact septum.  No purulence noted. Oral:  Oral cavity and oropharynx are intact, symmetric, without erythema or edema.  Mucosa is moist without lesions. Neck: Full range of motion without  pain.  There is no significant lymphadenopathy.  No masses palpable.  Thyroid bed within normal limits to palpation.  Parotid glands and submandibular glands equal bilaterally without mass.  Trachea is midline. Neuro:  CN 2-12 grossly intact. Gait normal.   Procedure:  Flexible Fiberoptic Laryngoscopy Risks, benefits, and alternatives of flexible endoscopy were explained to the patient.  Specific mention was made of the risk of throat numbness with difficulty swallowing, possible bleeding from the nose and mouth, and pain from the procedure.  The patient gave oral consent to proceed.  The nasal cavities were decongested and anesthetised with a combination of oxymetazoline and 4% lidocaine solution.  The flexible scope was inserted into the right nasal cavity and advanced towards the nasopharynx.  Visualized mucosa over the turbinates and septum were as described above.  The nasopharynx was clear.  Oropharyngeal walls were symmetric and mobile without lesion, mass, or edema.  Hypopharynx was also without  lesion or edema.  Larynx was mobile without lesions. Supraglottic structures were free of edema, mass, and asymmetry.  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  The right vocal cord mucosa was noted to be irregular.  The left vocal cord was normal.  Both vocal cords were mobile.  Base of tongue was within normal limits.   Assessment 1.  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  The right vocal cord mucosa is noted to be irregular.  The left vocal cord is normal.  Both vocal cords are mobile.  2.  No other suspicious mass or lesion is noted today.   Plan 1.  The physical exam findings and laryngoscopy findings are reviewed with the patient.  2.  Microdirect laryngoscopy with biopsy of the right vocal  cord.  The risks, benefits, alternatives and details of the procedure are reviewed with the patient.   3.  We will schedule the procedure in accordance with the patient's schedule.

## 2014-08-15 NOTE — Discharge Instructions (Signed)
Laryngoscopy A laryngoscopy is a procedure performed to view the back of the throat, vocal cords, and voice box (larynx). It may be done in order to figure out why a person has:  A cough.  Voice changes, such as new hoarseness.  Throat pain.  Problems swallowing. During a laryngoscopy, tissue samples (biopsies) can be taken or foreign bodies can be removed. A laryngoscopy can be done in the caregiver's office. It may be performed with a hand-held mirror, or it may require the use of a fiberoptic scope. Under some circumstances, it may be done in a hospital with medicine to help you sleep (general anesthesia).  AFTER THE PROCEDURE   When laryngoscopy is done with only local numbing, it usually does not require any changes to your activity level after the procedure is complete.  You may have a sore throat.  You may be asked to rest the voice for some length of time after the procedure.  Do not smoke.  Follow your caregiver's directions regarding eating and drinking after the procedure.  If you had a biopsy taken, your caregiver may advise trying to avoid coughing, whispering, or clearing the throat.  If you were given a general anesthetic or a medicine to help you relax (sedative), you will be sleepy.  There may be some pain or you may feel sick to your stomach (nauseous), but this can usually be controlled with medicines taken by mouth.  You will stay in the recovery room until awake and able to drink fluids.  You can go back to his or her usual level of activity within several days. HOME CARE INSTRUCTIONS   Take all medicines exactly as directed.  Follow any prescribed diet.  Follow instructions regarding rest (including voice rest) and physical activity.  Follow instructions for the use of throat lozenges or gargles. SEEK IMMEDIATE MEDICAL CARE IF:   You have severe pain.  You have new bleeding during coughing, spitting, or vomiting.  You develop new problems with  swallowing.  You have difficulty breathing or have shortness of breath. MAKE SURE YOU:   Understand these instructions.  Will watch your condition.  Will get help right away if you are not doing well or get worse. Document Released: 04/01/2009 Document Revised: 05/22/2013 Document Reviewed: 04/01/2009 [facility] Patient Information 2015 [place], [place]. This information is not intended to replace advice given to you by your health care provider. Make sure you discuss any questions you have with your health care provider.

## 2014-08-15 NOTE — Anesthesia Procedure Notes (Addendum)
Procedure Name: Intubation Date/Time: 08/15/2014 9:03 AM Performed by: Garrison Columbus T Pre-anesthesia Checklist: Patient identified, Emergency Drugs available, Suction available and Patient being monitored Patient Re-evaluated:Patient Re-evaluated prior to inductionOxygen Delivery Method: Circle system utilized Preoxygenation: Pre-oxygenation with 100% oxygen Intubation Type: IV induction Ventilation: Mask ventilation without difficulty and Oral airway inserted - appropriate to patient size Laryngoscope Size: Mac, 3, Miller and 2 Grade View: Grade II Tube type: Oral Tube size: 6.5 mm Number of attempts: 2 Airway Equipment and Method: Stylet and Oral airway Placement Confirmation: ETT inserted through vocal cords under direct vision,  breath sounds checked- equal and bilateral,  positive ETCO2 and CO2 detector Secured at: 22 (secured at left lower side of mouthy\) cm Tube secured with: Tape Dental Injury: Teeth and Oropharynx as per pre-operative assessment  Comments: DLx1 with Miller 2, grade 2 view but larynx angled to side, LTA and lost view. DLx2 with Mac 3 to obtain grade 2 view. Atraumatic insertion of ETT.

## 2014-08-15 NOTE — Anesthesia Preprocedure Evaluation (Addendum)
Anesthesia Evaluation  Patient identified by MRN, date of birth, ID band Patient awake    Reviewed: Allergy & Precautions, NPO status , Patient's Chart, lab work & pertinent test results  Airway Mallampati: II  TM Distance: >3 FB Neck ROM: Full    Dental  (+) Dental Advisory Given, Poor Dentition   Pulmonary former smoker, PE breath sounds clear to auscultation        Cardiovascular hypertension, Pt. on medications Rhythm:Regular     Neuro/Psych    GI/Hepatic   Endo/Other  Morbid obesity  Renal/GU      Musculoskeletal   Abdominal   Peds  Hematology   Anesthesia Other Findings   Reproductive/Obstetrics                          Anesthesia Physical Anesthesia Plan  ASA: II  Anesthesia Plan: General   Post-op Pain Management:    Induction: Intravenous  Airway Management Planned: Oral ETT  Additional Equipment:   Intra-op Plan:   Post-operative Plan: Extubation in OR  Informed Consent: I have reviewed the patients History and Physical, chart, labs and discussed the procedure including the risks, benefits and alternatives for the proposed anesthesia with the patient or authorized representative who has indicated his/her understanding and acceptance.   Dental advisory given  Plan Discussed with: CRNA, Anesthesiologist and Surgeon  Anesthesia Plan Comments:        Anesthesia Quick Evaluation

## 2014-08-15 NOTE — Op Note (Signed)
DATE OF PROCEDURE:  08/15/2014                              OPERATIVE REPORT  SURGEON:  Leta Baptist, MD  PREOPERATIVE DIAGNOSES: 1. Chronic hoarseness 2. Right vocal cord leukoplakia  POSTOPERATIVE DIAGNOSES: 1. Chronic hoarseness 2. Right vocal cord leukoplakia  PROCEDURE PERFORMED:  MicroDirect laryngoscopy with biopsy  ANESTHESIA:  General endotracheal tube anesthesia.  COMPLICATIONS:  None.  ESTIMATED BLOOD LOSS:  Minimal.  INDICATION FOR PROCEDURE:  Annette Clark is a 64 y.o. female with a history of chronic hoarseness. On examination, the patient was noted to have significant leukoplakia of her right vocal cord. The right vocal cord mucosa was noted to be irregular. The findings were concerning for malignancy. The decision was therefore made for the patient to undergo the direct laryngoscopy and biopsy of the right vocal cord under general anesthesia. The risks, benefits, alternatives, and details of the procedure were discussed with the mother.  Questions were invited and answered.  Informed consent was obtained.  DESCRIPTION:  The patient was taken to the operating room and placed supine on the operating table.  General endotracheal tube anesthesia was administered by the anesthesiologist.  The patient was positioned and prepped and draped in a standard fashion for direct laryngoscopy. A Dedo laryngoscope was used for examination. The laryngoscope was inserted via the oral cavity into the pharynx. Examination of the vallecula, epiglottis, aryepiglottic folds, and piriform sinuses were all normal. Examination of the vocal cords revealed diffuse leukoplakia of the right vocal cord. The irregular mucosa was then biopsied. Several biopsy specimens were obtained and sent to the pathology department for permanent histologic identification. Hemostasis was achieved with pledgets soaked with epinephrine. The care of the patient was turned over to the anesthesiologist.  The patient was awakened  from anesthesia without difficulty.  She was extubated and transferred to the recovery room in good condition.  OPERATIVE FINDINGS:  Right vocal cord leukoplakia.  SPECIMEN:  Right vocal cord biopsy specimens  FOLLOWUP CARE:  The patient will be discharged home once awake and alert.  Tylenol with oxycodone can be taken on a p.r.n. basis for additional pain control.  The patient will follow up in my office in approximately 1 week.  Annette Clark 08/15/2014 9:29 AM

## 2014-08-16 ENCOUNTER — Telehealth: Payer: Self-pay | Admitting: *Deleted

## 2014-08-16 ENCOUNTER — Encounter (HOSPITAL_COMMUNITY): Payer: Self-pay | Admitting: Otolaryngology

## 2014-08-16 NOTE — Anesthesia Postprocedure Evaluation (Signed)
  Anesthesia Post-op Note  Patient: Annette Clark  Procedure(s) Performed: Procedure(s): MICRO DIRECT LARYNGOSCOPY WITH BIOPSY (N/A)  Patient Location: PACU  Anesthesia Type:General  Level of Consciousness: awake  Airway and Oxygen Therapy: Patient Spontanous Breathing  Post-op Pain: none  Post-op Assessment: Post-op Vital signs reviewed, Patient's Cardiovascular Status Stable, Respiratory Function Stable, Patent Airway, No signs of Nausea or vomiting and Pain level controlled              Post-op Vital Signs: Reviewed and stable  Last Vitals:  Filed Vitals:   08/15/14 1014  BP: 96/70  Pulse: 65  Temp:   Resp:     Complications: No apparent anesthesia complications

## 2014-08-16 NOTE — Telephone Encounter (Signed)
Pt called to cancel her appointment on 08/16/14 with Dr. Fermin Schwab. Pt states " Im not feeling well, I just had a vocal cord biopsy at [facility] yesterday and I'm sore". Pt declined to reschedule at this time. Pt states " I will call back later to reschedule when I feel better".

## 2014-08-17 ENCOUNTER — Ambulatory Visit: Payer: Self-pay | Admitting: Gynecology

## 2014-08-23 ENCOUNTER — Ambulatory Visit (INDEPENDENT_AMBULATORY_CARE_PROVIDER_SITE_OTHER): Payer: BLUE CROSS/BLUE SHIELD | Admitting: Otolaryngology

## 2014-08-23 DIAGNOSIS — R490 Dysphonia: Secondary | ICD-10-CM | POA: Diagnosis not present

## 2014-08-23 DIAGNOSIS — J382 Nodules of vocal cords: Secondary | ICD-10-CM

## 2014-10-22 DIAGNOSIS — R490 Dysphonia: Secondary | ICD-10-CM | POA: Insufficient documentation

## 2014-10-26 ENCOUNTER — Ambulatory Visit (HOSPITAL_COMMUNITY): Payer: Self-pay | Admitting: Hematology & Oncology

## 2014-12-17 DIAGNOSIS — J387 Other diseases of larynx: Secondary | ICD-10-CM | POA: Insufficient documentation

## 2014-12-19 ENCOUNTER — Other Ambulatory Visit (HOSPITAL_COMMUNITY): Payer: Self-pay | Admitting: Family Medicine

## 2014-12-19 DIAGNOSIS — Z1231 Encounter for screening mammogram for malignant neoplasm of breast: Secondary | ICD-10-CM

## 2014-12-26 ENCOUNTER — Ambulatory Visit (HOSPITAL_COMMUNITY)
Admission: RE | Admit: 2014-12-26 | Discharge: 2014-12-26 | Disposition: A | Discharge status: Home / Self Care | Payer: BLUE CROSS/BLUE SHIELD | Source: Ambulatory Visit | Attending: Family Medicine | Admitting: Family Medicine

## 2014-12-26 DIAGNOSIS — Z1231 Encounter for screening mammogram for malignant neoplasm of breast: Secondary | ICD-10-CM | POA: Diagnosis present

## 2015-04-19 ENCOUNTER — Ambulatory Visit: Payer: BLUE CROSS/BLUE SHIELD | Admitting: Gynecology

## 2015-05-17 ENCOUNTER — Encounter: Payer: Self-pay | Admitting: Gynecology

## 2015-05-17 ENCOUNTER — Ambulatory Visit: Payer: BLUE CROSS/BLUE SHIELD | Attending: Gynecology | Admitting: Gynecology

## 2015-05-17 VITALS — BP 114/68 | HR 70 | Temp 98.2°F | Resp 19 | Ht 64.0 in | Wt 288.3 lb

## 2015-05-17 DIAGNOSIS — Z86711 Personal history of pulmonary embolism: Secondary | ICD-10-CM | POA: Diagnosis not present

## 2015-05-17 DIAGNOSIS — Z87891 Personal history of nicotine dependence: Secondary | ICD-10-CM | POA: Diagnosis not present

## 2015-05-17 DIAGNOSIS — E669 Obesity, unspecified: Secondary | ICD-10-CM | POA: Insufficient documentation

## 2015-05-17 DIAGNOSIS — I10 Essential (primary) hypertension: Secondary | ICD-10-CM | POA: Insufficient documentation

## 2015-05-17 DIAGNOSIS — Z9221 Personal history of antineoplastic chemotherapy: Secondary | ICD-10-CM | POA: Diagnosis not present

## 2015-05-17 DIAGNOSIS — Z9889 Other specified postprocedural states: Secondary | ICD-10-CM | POA: Insufficient documentation

## 2015-05-17 DIAGNOSIS — Z8543 Personal history of malignant neoplasm of ovary: Secondary | ICD-10-CM | POA: Diagnosis not present

## 2015-05-17 DIAGNOSIS — Z79899 Other long term (current) drug therapy: Secondary | ICD-10-CM | POA: Insufficient documentation

## 2015-05-17 DIAGNOSIS — C569 Malignant neoplasm of unspecified ovary: Secondary | ICD-10-CM | POA: Diagnosis present

## 2015-05-17 NOTE — Patient Instructions (Signed)
Plan to follow up in one year or sooner if needed.  Please call in November or December 2017 to schedule an appointment for April 2018. 
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
## 2015-11-27 ENCOUNTER — Other Ambulatory Visit (HOSPITAL_COMMUNITY): Payer: Self-pay | Admitting: Family Medicine

## 2015-11-27 DIAGNOSIS — Z1231 Encounter for screening mammogram for malignant neoplasm of breast: Secondary | ICD-10-CM

## 2016-01-01 ENCOUNTER — Ambulatory Visit (HOSPITAL_COMMUNITY): Payer: BLUE CROSS/BLUE SHIELD

## 2016-01-24 ENCOUNTER — Inpatient Hospital Stay (HOSPITAL_COMMUNITY): Admission: RE | Admit: 2016-01-24 | Payer: BLUE CROSS/BLUE SHIELD | Source: Ambulatory Visit

## 2016-01-31 ENCOUNTER — Ambulatory Visit (HOSPITAL_COMMUNITY)
Admission: RE | Admit: 2016-01-31 | Discharge: 2016-01-31 | Disposition: A | Discharge status: Home / Self Care | Payer: BLUE CROSS/BLUE SHIELD | Source: Ambulatory Visit | Attending: Family Medicine | Admitting: Family Medicine

## 2016-01-31 DIAGNOSIS — Z1231 Encounter for screening mammogram for malignant neoplasm of breast: Secondary | ICD-10-CM | POA: Diagnosis not present

## 2016-07-28 ENCOUNTER — Ambulatory Visit (HOSPITAL_COMMUNITY)
Admission: RE | Admit: 2016-07-28 | Discharge: 2016-07-28 | Disposition: A | Discharge status: Home / Self Care | Payer: Medicare Other | Source: Ambulatory Visit | Attending: Family Medicine | Admitting: Family Medicine

## 2016-07-28 ENCOUNTER — Other Ambulatory Visit (HOSPITAL_COMMUNITY): Payer: Self-pay | Admitting: Family Medicine

## 2016-07-28 DIAGNOSIS — I10 Essential (primary) hypertension: Secondary | ICD-10-CM | POA: Diagnosis not present

## 2016-07-28 DIAGNOSIS — M25511 Pain in right shoulder: Secondary | ICD-10-CM | POA: Diagnosis not present

## 2016-07-28 DIAGNOSIS — R7309 Other abnormal glucose: Secondary | ICD-10-CM | POA: Diagnosis not present

## 2016-07-28 DIAGNOSIS — Z1389 Encounter for screening for other disorder: Secondary | ICD-10-CM | POA: Insufficient documentation

## 2016-07-28 DIAGNOSIS — Z6841 Body Mass Index (BMI) 40.0 and over, adult: Secondary | ICD-10-CM | POA: Insufficient documentation

## 2016-07-28 DIAGNOSIS — Z0001 Encounter for general adult medical examination with abnormal findings: Secondary | ICD-10-CM

## 2016-07-28 DIAGNOSIS — M1991 Primary osteoarthritis, unspecified site: Secondary | ICD-10-CM | POA: Diagnosis not present

## 2016-07-28 DIAGNOSIS — M19011 Primary osteoarthritis, right shoulder: Secondary | ICD-10-CM | POA: Diagnosis not present

## 2016-09-25 DIAGNOSIS — Z6841 Body Mass Index (BMI) 40.0 and over, adult: Secondary | ICD-10-CM | POA: Diagnosis not present

## 2016-09-25 DIAGNOSIS — Z1389 Encounter for screening for other disorder: Secondary | ICD-10-CM | POA: Diagnosis not present

## 2016-09-25 DIAGNOSIS — E039 Hypothyroidism, unspecified: Secondary | ICD-10-CM | POA: Diagnosis not present

## 2016-11-02 DIAGNOSIS — Z23 Encounter for immunization: Secondary | ICD-10-CM | POA: Diagnosis not present

## 2016-11-18 DIAGNOSIS — L814 Other melanin hyperpigmentation: Secondary | ICD-10-CM | POA: Diagnosis not present

## 2016-11-18 DIAGNOSIS — D225 Melanocytic nevi of trunk: Secondary | ICD-10-CM | POA: Diagnosis not present

## 2016-11-18 DIAGNOSIS — L820 Inflamed seborrheic keratosis: Secondary | ICD-10-CM | POA: Diagnosis not present

## 2016-11-18 DIAGNOSIS — L818 Other specified disorders of pigmentation: Secondary | ICD-10-CM | POA: Diagnosis not present

## 2016-11-18 DIAGNOSIS — D485 Neoplasm of uncertain behavior of skin: Secondary | ICD-10-CM | POA: Diagnosis not present

## 2016-12-15 DIAGNOSIS — H43393 Other vitreous opacities, bilateral: Secondary | ICD-10-CM | POA: Diagnosis not present

## 2016-12-15 DIAGNOSIS — H31002 Unspecified chorioretinal scars, left eye: Secondary | ICD-10-CM | POA: Diagnosis not present

## 2016-12-15 DIAGNOSIS — H43813 Vitreous degeneration, bilateral: Secondary | ICD-10-CM | POA: Diagnosis not present

## 2016-12-15 DIAGNOSIS — H1851 Endothelial corneal dystrophy: Secondary | ICD-10-CM | POA: Diagnosis not present

## 2017-01-13 ENCOUNTER — Other Ambulatory Visit (HOSPITAL_COMMUNITY): Payer: Self-pay | Admitting: Family Medicine

## 2017-01-13 DIAGNOSIS — Z1231 Encounter for screening mammogram for malignant neoplasm of breast: Secondary | ICD-10-CM

## 2017-01-21 DIAGNOSIS — Z6841 Body Mass Index (BMI) 40.0 and over, adult: Secondary | ICD-10-CM | POA: Diagnosis not present

## 2017-01-21 DIAGNOSIS — J301 Allergic rhinitis due to pollen: Secondary | ICD-10-CM | POA: Diagnosis not present

## 2017-01-21 DIAGNOSIS — J22 Unspecified acute lower respiratory infection: Secondary | ICD-10-CM | POA: Diagnosis not present

## 2017-01-21 DIAGNOSIS — Z1389 Encounter for screening for other disorder: Secondary | ICD-10-CM | POA: Diagnosis not present

## 2017-02-03 ENCOUNTER — Ambulatory Visit (HOSPITAL_COMMUNITY)
Admission: RE | Admit: 2017-02-03 | Discharge: 2017-02-03 | Disposition: A | Discharge status: Home / Self Care | Payer: Medicare Other | Source: Ambulatory Visit | Attending: Family Medicine | Admitting: Family Medicine

## 2017-02-03 DIAGNOSIS — Z1231 Encounter for screening mammogram for malignant neoplasm of breast: Secondary | ICD-10-CM | POA: Diagnosis not present

## 2017-03-23 ENCOUNTER — Ambulatory Visit (INDEPENDENT_AMBULATORY_CARE_PROVIDER_SITE_OTHER): Payer: Medicare Other

## 2017-03-23 ENCOUNTER — Ambulatory Visit (INDEPENDENT_AMBULATORY_CARE_PROVIDER_SITE_OTHER): Payer: Medicare Other | Admitting: Orthopedic Surgery

## 2017-03-23 ENCOUNTER — Encounter: Payer: Self-pay | Admitting: Orthopedic Surgery

## 2017-03-23 VITALS — BP 149/82 | HR 66 | Ht 64.5 in | Wt 293.0 lb

## 2017-03-23 DIAGNOSIS — M25561 Pain in right knee: Secondary | ICD-10-CM

## 2017-03-23 DIAGNOSIS — M23321 Other meniscus derangements, posterior horn of medial meniscus, right knee: Secondary | ICD-10-CM | POA: Diagnosis not present

## 2017-03-23 DIAGNOSIS — M1711 Unilateral primary osteoarthritis, right knee: Secondary | ICD-10-CM | POA: Diagnosis not present

## 2017-03-23 MED ORDER — NAPROXEN 500 MG PO TABS: 500.0000 mg | ORAL_TABLET | Freq: Two times a day (BID) | ORAL | 2 refills | Status: DC

## 2017-03-23 NOTE — Patient Instructions (Addendum)
Meniscus Tear A meniscus tear is a knee injury in which a piece of the meniscus is torn. The meniscus is a thick, rubbery, wedge-shaped cartilage in the knee. Two menisci are located in each knee. They sit between the upper bone (femur) and lower bone (tibia) that make up the knee joint. Each meniscus acts as a shock absorber for the knee. A torn meniscus is one of the most common types of knee injuries. This injury can range from mild to severe. Surgery may be needed for a severe tear. What are the causes? This injury may be caused by any squatting, twisting, or pivoting movement. Sports-related injuries are the most common cause. These often occur from:  Running and stopping suddenly.  Changing direction.  Being tackled or knocked off your feet.  As people get older, their meniscus gets thinner and weaker. In these people, tears can happen more easily, such as from climbing stairs. What increases the risk? This injury is more likely to happen to:  People who play contact sports.  Males.  People who are 30?66 years of age.  What are the signs or symptoms? Symptoms of this injury include:  Knee pain, especially at the side of the knee joint. You may feel pain when the injury occurs, or you may only hear a pop and feel pain later.  A feeling that your knee is clicking, catching, locking, or giving way.  Not being able to fully bend or extend your knee.  Bruising or swelling in your knee.  How is this diagnosed? This injury may be diagnosed based on your symptoms and a physical exam. The physical exam may include:  Moving your knee in different ways.  Feeling for tenderness.  Listening for a clicking sound.  Checking if your knee locks or catches.  You may also have tests, such as:  X-rays.  MRI.  A procedure to look inside your knee with a narrow surgical telescope (arthroscopy).  You may be referred to a knee specialist (orthopedic surgeon). How is this  treated? Treatment for this injury depends on the severity of the tear. Treatment for a mild tear may include:  Rest.  Medicine to reduce pain and swelling. This is usually a nonsteroidal anti-inflammatory drug (NSAID).  A knee brace or an elastic sleeve or wrap.  Using crutches or a walker to keep weight off your knee and to help you walk.  Exercises to strengthen your knee (physical therapy).  You may need surgery if you have a severe tear or if other treatments are not working. Follow these instructions at home: Managing pain and swelling  Take over-the-counter and prescription medicines only as told by your health care provider.  If directed, apply ice to the injured area: ? Put ice in a plastic bag. ? Place a towel between your skin and the bag. ? Leave the ice on for 20 minutes, 2-3 times per day.  Raise (elevate) the injured area above the level of your heart while you are sitting or lying down. Activity  Do not use the injured limb to support your body weight until your health care provider says that you can. Use crutches or a walker as told by your health care provider.  Return to your normal activities as told by your health care provider. Ask your health care provider what activities are safe for you.  Perform range-of-motion exercises only as told by your health care provider.  Begin doing exercises to strengthen your knee and leg muscles only   as told by your health care provider. After you recover, your health care provider may recommend these exercises to help prevent another injury. General instructions  Use a knee brace or elastic wrap as told by your health care provider.  Keep all follow-up visits as told by your health care provider. This is important. Contact a health care provider if:  You have a fever.  Your knee becomes red, tender, or swollen.  Your pain medicine is not helping.  Your symptoms get worse or do not improve after 2 weeks of home  care. This information is not intended to replace advice given to you by your health care provider. Make sure you discuss any questions you have with your health care provider. Document Released: 03/28/2002 Document Revised: 06/13/2015 Document Reviewed: 04/30/2014 Elsevier Interactive Patient Education  2018 [employer]  
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
## 2017-04-13 ENCOUNTER — Ambulatory Visit (INDEPENDENT_AMBULATORY_CARE_PROVIDER_SITE_OTHER): Payer: Medicare Other | Admitting: Orthopedic Surgery

## 2017-04-13 ENCOUNTER — Encounter: Payer: Self-pay | Admitting: Orthopedic Surgery

## 2017-04-13 VITALS — BP 102/65 | HR 70 | Ht 64.5 in | Wt 295.0 lb

## 2017-04-13 DIAGNOSIS — M23321 Other meniscus derangements, posterior horn of medial meniscus, right knee: Secondary | ICD-10-CM | POA: Diagnosis not present

## 2017-04-13 DIAGNOSIS — M1711 Unilateral primary osteoarthritis, right knee: Secondary | ICD-10-CM | POA: Diagnosis not present
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
## 2017-04-16 ENCOUNTER — Ambulatory Visit (HOSPITAL_COMMUNITY)
Admission: RE | Admit: 2017-04-16 | Discharge: 2017-04-16 | Disposition: A | Discharge status: Home / Self Care | Payer: Medicare Other | Source: Ambulatory Visit | Attending: Orthopedic Surgery | Admitting: Orthopedic Surgery

## 2017-04-16 DIAGNOSIS — X58XXXA Exposure to other specified factors, initial encounter: Secondary | ICD-10-CM | POA: Insufficient documentation

## 2017-04-16 DIAGNOSIS — S83231A Complex tear of medial meniscus, current injury, right knee, initial encounter: Secondary | ICD-10-CM | POA: Diagnosis not present

## 2017-04-16 DIAGNOSIS — M25561 Pain in right knee: Secondary | ICD-10-CM | POA: Diagnosis not present

## 2017-04-16 DIAGNOSIS — M23321 Other meniscus derangements, posterior horn of medial meniscus, right knee: Secondary | ICD-10-CM

## 2017-04-16 DIAGNOSIS — S83271A Complex tear of lateral meniscus, current injury, right knee, initial encounter: Secondary | ICD-10-CM | POA: Insufficient documentation

## 2017-04-16 DIAGNOSIS — M1711 Unilateral primary osteoarthritis, right knee: Secondary | ICD-10-CM

## 2017-04-20 ENCOUNTER — Ambulatory Visit (INDEPENDENT_AMBULATORY_CARE_PROVIDER_SITE_OTHER): Payer: Medicare Other | Admitting: Orthopedic Surgery

## 2017-04-20 ENCOUNTER — Encounter: Payer: Self-pay | Admitting: Orthopedic Surgery

## 2017-04-20 VITALS — BP 134/79 | HR 76 | Ht 64.5 in | Wt 295.0 lb

## 2017-04-20 DIAGNOSIS — M23300 Other meniscus derangements, unspecified lateral meniscus, right knee: Secondary | ICD-10-CM | POA: Diagnosis not present

## 2017-04-20 DIAGNOSIS — M1711 Unilateral primary osteoarthritis, right knee: Secondary | ICD-10-CM | POA: Diagnosis not present

## 2017-04-20 DIAGNOSIS — M23321 Other meniscus derangements, posterior horn of medial meniscus, right knee: Secondary | ICD-10-CM | POA: Diagnosis not present

## 2017-04-20 NOTE — Progress Notes (Signed)
FOLLOW UP VISIT : MRI RESULTS   Chief Complaint  Patient presents with  . Results    MRI RIGHT KNEE     HPI: The patient is here TO DISCUSS THE RESULTS OF MRI  67 year old female had an MRI for right knee for possible torn medial meniscus comes in complaining of continued pain intermittently in the right knee    Review of systems no new findings on the musculoskeletal neurologic or constitutional review of systems  BP 134/79   Pulse 76   Ht 5' 4.5" (1.638 m)   Wt 295 lb (133.8 kg)   BMI 49.85 kg/m     Medical decision-making section   DATA  MRI REPORT:  IMPRESSION: 1. Complex tear of the posterior horn of the medial meniscus. 2. Complex tear of the body of lateral meniscus extending into the anterior and posterior horns. 3. Tricompartmental cartilage abnormalities most consistent with advanced osteoarthritis.     Electronically Signed   By: Kathreen Devoid   On: 04/17/2017 10:00      MY READING: MRI OF THE 3 compartment arthritis medial and lateral meniscal tear   Encounter Diagnoses  Name Primary?  . Primary osteoarthritis of right knee Yes  . Derangement of posterior horn of medial meniscus of right knee   . Meniscus, lateral, derangement, right     Procedure note right knee injection verbal consent was obtained to inject right knee joint  Timeout was completed to confirm the site of injection  The medications used were 40 mg of Depo-Medrol and 1% lidocaine 3 cc  Anesthesia was provided by ethyl chloride and the skin was prepped with alcohol.  After cleaning the skin with alcohol a 20-gauge needle was used to inject the right knee joint. There were no complications. A sterile bandage was applied.   Ret 1 month  PLAN:

## 2017-05-19 ENCOUNTER — Encounter: Payer: Self-pay | Admitting: Orthopedic Surgery

## 2017-05-19 ENCOUNTER — Ambulatory Visit (INDEPENDENT_AMBULATORY_CARE_PROVIDER_SITE_OTHER): Payer: Medicare Other | Admitting: Orthopedic Surgery

## 2017-05-19 VITALS — BP 137/80 | HR 70 | Ht 64.5 in | Wt 290.0 lb

## 2017-05-19 DIAGNOSIS — M23321 Other meniscus derangements, posterior horn of medial meniscus, right knee: Secondary | ICD-10-CM | POA: Diagnosis not present

## 2017-05-19 DIAGNOSIS — M1711 Unilateral primary osteoarthritis, right knee: Secondary | ICD-10-CM

## 2017-05-19 NOTE — Progress Notes (Signed)
Progress Note   Patient ID: Annette Clark, female   DOB: [birth year], 67 y.o.   MRN: [id number]  Chief Complaint  Patient presents with  . Knee Pain    right kne feels better      Medical decision-making Encounter Diagnoses  Name Primary?  . Derangement of posterior horn of medial meniscus of right knee Yes  . Primary osteoarthritis of right knee       No orders of the defined types were placed in this encounter.    PLAN: Recommend repeat injections if needed, currently she is stable doing well with no major symptoms    Chief Complaint  Patient presents with  . Knee Pain    right kne feels better     67 year old female who was seen for knee pain after injection, she had an MRI which showed complex tear posterior horn medial meniscus and lateral meniscus with 3 compartment arthritis however she has improved with the injection    ROS Current Meds  Medication Sig  . acetaminophen (TYLENOL) 325 MG tablet Take 650 mg by mouth every 6 (six) hours as needed.  Marland Kitchen aspirin EC 81 MG tablet Take by mouth.  . halobetasol (ULTRAVATE) 0.05 % cream Apply topically daily.   Marland Kitchen levothyroxine (SYNTHROID, LEVOTHROID) 25 MCG tablet   . lisinopril (PRINIVIL,ZESTRIL) 20 MG tablet TK 1 T PO QD  . naproxen (NAPROSYN) 500 MG tablet Take 1 tablet (500 mg total) by mouth 2 (two) times daily with a meal.    No Known Allergies   BP 137/80   Pulse 70   Ht 5' 4.5" (1.638 m)   Wt 290 lb (131.5 kg)   BMI 49.01 kg/m   Physical Exam  Normal flexion-extension right knee normal gait pattern   Arther Abbott, MD 05/19/2017 11:40 AM

## 2017-05-21 ENCOUNTER — Ambulatory Visit: Payer: Medicare Other | Admitting: Orthopedic Surgery

## 2017-06-25 ENCOUNTER — Other Ambulatory Visit (HOSPITAL_COMMUNITY): Payer: Self-pay | Admitting: Family Medicine

## 2017-06-25 DIAGNOSIS — Z6841 Body Mass Index (BMI) 40.0 and over, adult: Secondary | ICD-10-CM | POA: Diagnosis not present

## 2017-06-25 DIAGNOSIS — Z1389 Encounter for screening for other disorder: Secondary | ICD-10-CM | POA: Diagnosis not present

## 2017-06-25 DIAGNOSIS — J069 Acute upper respiratory infection, unspecified: Secondary | ICD-10-CM | POA: Diagnosis not present

## 2017-06-25 DIAGNOSIS — E2839 Other primary ovarian failure: Secondary | ICD-10-CM

## 2017-07-15 ENCOUNTER — Other Ambulatory Visit (HOSPITAL_COMMUNITY): Payer: Medicare Other

## 2017-07-26 ENCOUNTER — Ambulatory Visit (HOSPITAL_COMMUNITY)
Admission: RE | Admit: 2017-07-26 | Discharge: 2017-07-26 | Disposition: A | Discharge status: Home / Self Care | Payer: Medicare Other | Source: Ambulatory Visit | Attending: Family Medicine | Admitting: Family Medicine

## 2017-07-26 DIAGNOSIS — E2839 Other primary ovarian failure: Secondary | ICD-10-CM | POA: Insufficient documentation

## 2017-07-26 DIAGNOSIS — Z780 Asymptomatic menopausal state: Secondary | ICD-10-CM | POA: Diagnosis not present

## 2017-07-26 DIAGNOSIS — M85851 Other specified disorders of bone density and structure, right thigh: Secondary | ICD-10-CM | POA: Diagnosis not present

## 2017-07-30 DIAGNOSIS — Z0001 Encounter for general adult medical examination with abnormal findings: Secondary | ICD-10-CM | POA: Diagnosis not present

## 2017-07-30 DIAGNOSIS — Z6841 Body Mass Index (BMI) 40.0 and over, adult: Secondary | ICD-10-CM | POA: Diagnosis not present

## 2017-11-08 ENCOUNTER — Ambulatory Visit (INDEPENDENT_AMBULATORY_CARE_PROVIDER_SITE_OTHER): Payer: Medicare Other | Admitting: Orthopedic Surgery

## 2017-11-08 VITALS — BP 115/67 | HR 68 | Ht 64.0 in | Wt 294.0 lb

## 2017-11-08 DIAGNOSIS — M1710 Unilateral primary osteoarthritis, unspecified knee: Secondary | ICD-10-CM

## 2017-11-08 DIAGNOSIS — M1711 Unilateral primary osteoarthritis, right knee: Secondary | ICD-10-CM

## 2017-11-08 NOTE — Progress Notes (Signed)
Chief Complaint  Patient presents with  . Follow-up    Recheck on right knee.    Procedure note right knee injection verbal consent was obtained to inject right knee joint  Timeout was completed to confirm the site of injection  The medications used were 40 mg of Depo-Medrol and 1% lidocaine 3 cc  Anesthesia was provided by ethyl chloride and the skin was prepped with alcohol.  After cleaning the skin with alcohol a 20-gauge needle was used to inject the right knee joint. There were no complications. A sterile bandage was applied.   Encounter Diagnosis  Name Primary?  . Primary localized osteoarthritis of knee Yes

## 2017-11-16 DIAGNOSIS — R110 Nausea: Secondary | ICD-10-CM | POA: Diagnosis not present

## 2017-11-16 DIAGNOSIS — Z23 Encounter for immunization: Secondary | ICD-10-CM | POA: Diagnosis not present

## 2017-11-16 DIAGNOSIS — Z6841 Body Mass Index (BMI) 40.0 and over, adult: Secondary | ICD-10-CM | POA: Diagnosis not present

## 2017-11-16 DIAGNOSIS — Z1389 Encounter for screening for other disorder: Secondary | ICD-10-CM | POA: Diagnosis not present

## 2017-11-16 DIAGNOSIS — R109 Unspecified abdominal pain: Secondary | ICD-10-CM | POA: Diagnosis not present

## 2017-11-30 ENCOUNTER — Ambulatory Visit (INDEPENDENT_AMBULATORY_CARE_PROVIDER_SITE_OTHER): Payer: Medicare Other | Admitting: Gastroenterology

## 2017-11-30 ENCOUNTER — Encounter: Payer: Self-pay | Admitting: Gastroenterology

## 2017-11-30 DIAGNOSIS — R1030 Lower abdominal pain, unspecified: Secondary | ICD-10-CM

## 2017-11-30 NOTE — Progress Notes (Signed)
cc'ed to pcp °

## 2017-11-30 NOTE — Patient Instructions (Signed)
1. Monitor for recurrent symptoms of abdominal pain, any change in bowels, nausea, etc.  If you have any recurrent symptoms, please call our office at [phone] and asked to speak to my nurse.  At that time we would order stat CT scan of your abdomen/pelvis. 2. Otherwise we will see back in 2 months for follow-up.  You may cancel the appointment if you feel 100% well.
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
## 2017-11-30 NOTE — Assessment & Plan Note (Signed)
67 year old female presenting for evaluation of lower abdominal pain.  She has a history of ovarian cancer 13 years ago requiring removal of part of her colon, subsequent small bowel obstruction which did not require any additional surgery.  Several weeks ago she started having intermittent colicky sharp crampy abdominal pain in the lower abdomen.  Some nausea.  Bowel function was normal.  She may go a day or 2 without symptoms and then symptoms will recur again.  Last episode was October 29 when she saw her PCP.  Since then she has had no pain.  Question symptoms related to adhesive disease.  We discussed if she had recurrent symptoms, she will call and we will schedule STAT CT abdomen and pelvis with contrast at that time.  We discussed that CT now would likely be low yield given she is asymptomatic.  She agrees with plan.  She will call if anything changes or she has any concerns.  Otherwise we will see her back in 2 months for follow-up.

## 2017-12-28 DIAGNOSIS — D2271 Melanocytic nevi of right lower limb, including hip: Secondary | ICD-10-CM | POA: Diagnosis not present

## 2017-12-28 DIAGNOSIS — L905 Scar conditions and fibrosis of skin: Secondary | ICD-10-CM | POA: Diagnosis not present

## 2017-12-28 DIAGNOSIS — L820 Inflamed seborrheic keratosis: Secondary | ICD-10-CM | POA: Diagnosis not present

## 2017-12-28 DIAGNOSIS — Z1283 Encounter for screening for malignant neoplasm of skin: Secondary | ICD-10-CM | POA: Diagnosis not present

## 2017-12-28 DIAGNOSIS — D225 Melanocytic nevi of trunk: Secondary | ICD-10-CM | POA: Diagnosis not present

## 2017-12-28 DIAGNOSIS — L858 Other specified epidermal thickening: Secondary | ICD-10-CM | POA: Diagnosis not present

## 2017-12-28 DIAGNOSIS — D485 Neoplasm of uncertain behavior of skin: Secondary | ICD-10-CM | POA: Diagnosis not present

## 2018-01-04 ENCOUNTER — Emergency Department (HOSPITAL_COMMUNITY): Payer: Medicare Other

## 2018-01-04 ENCOUNTER — Other Ambulatory Visit: Payer: Self-pay

## 2018-01-04 ENCOUNTER — Inpatient Hospital Stay (HOSPITAL_COMMUNITY)
Admission: EM | Admit: 2018-01-04 | Discharge: 2018-01-08 | DRG: 389 | Disposition: A | Discharge status: Home / Self Care | Payer: Medicare Other | Attending: Internal Medicine | Admitting: Internal Medicine

## 2018-01-04 ENCOUNTER — Encounter (HOSPITAL_COMMUNITY): Payer: Self-pay | Admitting: Emergency Medicine

## 2018-01-04 DIAGNOSIS — K760 Fatty (change of) liver, not elsewhere classified: Secondary | ICD-10-CM | POA: Diagnosis present

## 2018-01-04 DIAGNOSIS — K5651 Intestinal adhesions [bands], with partial obstruction: Principal | ICD-10-CM | POA: Diagnosis present

## 2018-01-04 DIAGNOSIS — R112 Nausea with vomiting, unspecified: Secondary | ICD-10-CM | POA: Diagnosis not present

## 2018-01-04 DIAGNOSIS — I10 Essential (primary) hypertension: Secondary | ICD-10-CM | POA: Diagnosis not present

## 2018-01-04 DIAGNOSIS — E039 Hypothyroidism, unspecified: Secondary | ICD-10-CM | POA: Diagnosis present

## 2018-01-04 DIAGNOSIS — R1110 Vomiting, unspecified: Secondary | ICD-10-CM | POA: Diagnosis not present

## 2018-01-04 DIAGNOSIS — Z87891 Personal history of nicotine dependence: Secondary | ICD-10-CM

## 2018-01-04 DIAGNOSIS — K5900 Constipation, unspecified: Secondary | ICD-10-CM | POA: Diagnosis not present

## 2018-01-04 DIAGNOSIS — Z9049 Acquired absence of other specified parts of digestive tract: Secondary | ICD-10-CM | POA: Diagnosis not present

## 2018-01-04 DIAGNOSIS — R1031 Right lower quadrant pain: Secondary | ICD-10-CM | POA: Diagnosis not present

## 2018-01-04 DIAGNOSIS — Z6841 Body Mass Index (BMI) 40.0 and over, adult: Secondary | ICD-10-CM | POA: Diagnosis not present

## 2018-01-04 DIAGNOSIS — R109 Unspecified abdominal pain: Secondary | ICD-10-CM | POA: Diagnosis not present

## 2018-01-04 DIAGNOSIS — R079 Chest pain, unspecified: Secondary | ICD-10-CM | POA: Diagnosis not present

## 2018-01-04 DIAGNOSIS — Z86711 Personal history of pulmonary embolism: Secondary | ICD-10-CM

## 2018-01-04 DIAGNOSIS — K56609 Unspecified intestinal obstruction, unspecified as to partial versus complete obstruction: Secondary | ICD-10-CM | POA: Diagnosis present

## 2018-01-04 DIAGNOSIS — Z8543 Personal history of malignant neoplasm of ovary: Secondary | ICD-10-CM

## 2018-01-04 DIAGNOSIS — R05 Cough: Secondary | ICD-10-CM | POA: Diagnosis not present

## 2018-01-04 DIAGNOSIS — Z7982 Long term (current) use of aspirin: Secondary | ICD-10-CM

## 2018-01-04 HISTORY — DX: Hypothyroidism, unspecified: E03.9

## 2018-01-04 LAB — COMPREHENSIVE METABOLIC PANEL
ALT: 24 U/L (ref 0–44)
AST: 30 U/L (ref 15–41)
Albumin: 3.8 g/dL (ref 3.5–5.0)
Alkaline Phosphatase: 92 U/L (ref 38–126)
Anion gap: 9 (ref 5–15)
BUN: 17 mg/dL (ref 8–23)
CO2: 25 mmol/L (ref 22–32)
Calcium: 8.7 mg/dL — ABNORMAL LOW (ref 8.9–10.3)
Chloride: 104 mmol/L (ref 98–111)
Creatinine, Ser: 0.59 mg/dL (ref 0.44–1.00)
GFR calc Af Amer: >60 mL/min (ref >60)
GFR calc non Af Amer: >60 mL/min (ref >60)
Glucose, Bld: 152 mg/dL — ABNORMAL HIGH (ref 70–99)
Potassium: 3.6 mmol/L (ref 3.5–5.1)
SODIUM: 138 mmol/L (ref 135–145)
Total Bilirubin: 0.7 mg/dL (ref 0.3–1.2)
Total Protein: 7.2 g/dL (ref 6.5–8.1)

## 2018-01-04 LAB — CBC
HCT: 43.5 % (ref 36.0–46.0)
HEMOGLOBIN: 13.1 g/dL (ref 12.0–15.0)
MCH: 26.4 pg (ref 26.0–34.0)
MCHC: 30.1 g/dL (ref 30.0–36.0)
MCV: 87.7 fL (ref 80.0–100.0)
Platelets: 258 10*3/uL (ref 150–400)
RBC: 4.96 MIL/uL (ref 3.87–5.11)
RDW: 13.9 % (ref 11.5–15.5)
WBC: 10.1 10*3/uL (ref 4.0–10.5)
nRBC: 0 % (ref 0.0–0.2)

## 2018-01-04 LAB — URINALYSIS, ROUTINE W REFLEX MICROSCOPIC
BILIRUBIN URINE: NEGATIVE
Glucose, UA: 50 mg/dL — AB
Hgb urine dipstick: NEGATIVE
Ketones, ur: NEGATIVE mg/dL
LEUKOCYTES UA: NEGATIVE
Nitrite: POSITIVE — AB
PROTEIN: NEGATIVE mg/dL
Specific Gravity, Urine: 1.02 (ref 1.005–1.030)
pH: 7 (ref 5.0–8.0)

## 2018-01-04 LAB — MAGNESIUM: Magnesium: 1.7 mg/dL (ref 1.7–2.4)

## 2018-01-04 LAB — TROPONIN I: Troponin I: 0.03 ng/mL (ref <0.03)

## 2018-01-04 LAB — LIPASE, BLOOD: Lipase: 27 U/L (ref 11–51)

## 2018-01-04 MED ORDER — HYDRALAZINE HCL 20 MG/ML IJ SOLN: 10.0000 mg | INTRAMUSCULAR | Status: DC | PRN

## 2018-01-04 MED ORDER — ONDANSETRON HCL 4 MG/2ML IJ SOLN
4.0000 mg | Freq: Once | INTRAMUSCULAR | Status: AC
  Administered 2018-01-04: 4 mg via INTRAVENOUS
  Filled 2018-01-04: qty 2

## 2018-01-04 MED ORDER — LACTATED RINGERS IV SOLN
INTRAVENOUS | Status: DC
  Administered 2018-01-04 – 2018-01-07 (×4): via INTRAVENOUS

## 2018-01-04 MED ORDER — ONDANSETRON HCL 4 MG/2ML IJ SOLN
4.0000 mg | Freq: Four times a day (QID) | INTRAMUSCULAR | Status: DC | PRN
  Administered 2018-01-04: 4 mg via INTRAVENOUS
  Filled 2018-01-04: qty 2

## 2018-01-04 MED ORDER — SODIUM CHLORIDE 0.9 % IV BOLUS
1000.0000 mL | Freq: Once | INTRAVENOUS | Status: AC
  Administered 2018-01-04: 1000 mL via INTRAVENOUS

## 2018-01-04 MED ORDER — IOPAMIDOL (ISOVUE-300) INJECTION 61%
100.0000 mL | Freq: Once | INTRAVENOUS | Status: AC | PRN
  Administered 2018-01-04: 100 mL via INTRAVENOUS

## 2018-01-04 MED ORDER — FENTANYL CITRATE (PF) 100 MCG/2ML IJ SOLN: 25.0000 ug | INTRAMUSCULAR | Status: DC | PRN

## 2018-01-04 NOTE — ED Notes (Signed)
PIV in rt [facility] is position, attempting to establish alternate access.

## 2018-01-04 NOTE — ED Notes (Signed)
IV access established.

## 2018-01-04 NOTE — ED Triage Notes (Signed)
Pt c/o of n/v since last night. No fever or diarrhea.

## 2018-01-04 NOTE — ED Notes (Signed)
Patient transported to X-ray 
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
## 2018-01-05 DIAGNOSIS — Z6841 Body Mass Index (BMI) 40.0 and over, adult: Secondary | ICD-10-CM | POA: Diagnosis not present

## 2018-01-05 DIAGNOSIS — K56609 Unspecified intestinal obstruction, unspecified as to partial versus complete obstruction: Secondary | ICD-10-CM

## 2018-01-05 DIAGNOSIS — Z9049 Acquired absence of other specified parts of digestive tract: Secondary | ICD-10-CM | POA: Diagnosis not present

## 2018-01-05 DIAGNOSIS — K760 Fatty (change of) liver, not elsewhere classified: Secondary | ICD-10-CM | POA: Diagnosis present

## 2018-01-05 DIAGNOSIS — I10 Essential (primary) hypertension: Secondary | ICD-10-CM | POA: Diagnosis present

## 2018-01-05 DIAGNOSIS — Z86711 Personal history of pulmonary embolism: Secondary | ICD-10-CM | POA: Diagnosis not present

## 2018-01-05 DIAGNOSIS — Z8543 Personal history of malignant neoplasm of ovary: Secondary | ICD-10-CM | POA: Diagnosis not present

## 2018-01-05 DIAGNOSIS — Z87891 Personal history of nicotine dependence: Secondary | ICD-10-CM | POA: Diagnosis not present

## 2018-01-05 DIAGNOSIS — E039 Hypothyroidism, unspecified: Secondary | ICD-10-CM | POA: Diagnosis present

## 2018-01-05 DIAGNOSIS — Z7982 Long term (current) use of aspirin: Secondary | ICD-10-CM | POA: Diagnosis not present

## 2018-01-05 DIAGNOSIS — K5651 Intestinal adhesions [bands], with partial obstruction: Secondary | ICD-10-CM | POA: Diagnosis present

## 2018-01-05 LAB — COMPREHENSIVE METABOLIC PANEL
ALT: 17 U/L (ref 0–44)
ANION GAP: 6 (ref 5–15)
AST: 17 U/L (ref 15–41)
Albumin: 3.2 g/dL — ABNORMAL LOW (ref 3.5–5.0)
Alkaline Phosphatase: 71 U/L (ref 38–126)
BUN: 13 mg/dL (ref 8–23)
CHLORIDE: 106 mmol/L (ref 98–111)
CO2: 27 mmol/L (ref 22–32)
Calcium: 8.1 mg/dL — ABNORMAL LOW (ref 8.9–10.3)
Creatinine, Ser: 0.57 mg/dL (ref 0.44–1.00)
GFR calc Af Amer: >60 mL/min (ref >60)
GFR calc non Af Amer: >60 mL/min (ref >60)
Glucose, Bld: 103 mg/dL — ABNORMAL HIGH (ref 70–99)
POTASSIUM: 3.6 mmol/L (ref 3.5–5.1)
Sodium: 139 mmol/L (ref 135–145)
Total Bilirubin: 0.9 mg/dL (ref 0.3–1.2)
Total Protein: 6 g/dL — ABNORMAL LOW (ref 6.5–8.1)

## 2018-01-05 LAB — MAGNESIUM: Magnesium: 2 mg/dL (ref 1.7–2.4)

## 2018-01-05 LAB — CBC
HEMATOCRIT: 36.9 % (ref 36.0–46.0)
HEMOGLOBIN: 11.2 g/dL — AB (ref 12.0–15.0)
MCH: 26.9 pg (ref 26.0–34.0)
MCHC: 30.4 g/dL (ref 30.0–36.0)
MCV: 88.5 fL (ref 80.0–100.0)
Platelets: 203 10*3/uL (ref 150–400)
RBC: 4.17 MIL/uL (ref 3.87–5.11)
RDW: 14.2 % (ref 11.5–15.5)
WBC: 7 10*3/uL (ref 4.0–10.5)
nRBC: 0 % (ref 0.0–0.2)

## 2018-01-05 NOTE — Progress Notes (Signed)
PROGRESS NOTE    Annette Clark  LGX:[id number] DOB: 05-18-1950 DOA: 01/04/2018 PCP: Sharilyn Sites, MD   Brief Narrative:  Per HPI from Dr. Velia Meyer: Annette Clark is a 67 y.o. female with medical history significant for ovarian cancer status post total abdominal hysterectomy with bilateral salpingo-oophorectomy in 2006, partial colectomy in 2006, small bowel obstruction, who is admitted to [facility] on 01/04/2018 with partial small bowel obstructionafter presenting from home to [facility] emergency department complaining of abdominal discomfort.  She was admitted with partial small bowel obstruction and has been seen by general surgery this morning.   Assessment & Plan:   Principal Problem:   SBO (small bowel obstruction) (HCC) Active Problems:   HTN (hypertension)   Abdominal pain   Nausea & vomiting   Hypothyroidism   1. Abdominal pain secondary to partial SBO likely related to adhesions.  Continue n.p.o. status with IV fluid ongoing and rate reduced.  NG tube to be considered as needed.  Appreciate general surgery recommendations with likely diet advancement by tomorrow. 2. Essential hypertension.  Continue as needed hydralazine with lisinopril withheld for the time being. 3. Hypothyroidism.  Continue to withhold Synthroid and consider restart by tomorrow once diet is advanced.   DVT prophylaxis: SCDs Code Status: Full Family Communication: Husband at bedside Disposition Plan: Continue conservative management of partial SBO   Consultants:   General surgery  Procedures:   None  Antimicrobials:   None   Subjective: Patient seen and evaluated today with no new acute complaints or concerns. No acute concerns or events noted overnight.  She states that her abdominal pain has improved and she is passing flatus, but has not had a bowel movement.  She denies any further nausea or vomiting.  Objective: Vitals:   01/04/18 1811 01/04/18 1936 01/04/18  2119 01/05/18 0608  BP:  (!) 147/68 130/72 130/67  Pulse:   80 72  Resp:   16 16  Temp: 99.5 F (37.5 C)  100.1 F (37.8 C) 98.7 F (37.1 C)  TempSrc: Oral  Oral Oral  SpO2:   94% 93%  Weight:      Height:        Intake/Output Summary (Last 24 hours) at 01/05/2018 1120 Last data filed at 01/05/2018 0900 Gross per 24 hour  Intake 2084.81 ml  Output -  Net 2084.81 ml   Filed Weights   01/04/18 1215  Weight: 129.7 kg    Examination:  General exam: Appears calm and comfortable  Respiratory system: Clear to auscultation. Respiratory effort normal. Cardiovascular system: S1 & S2 heard, RRR. No JVD, murmurs, rubs, gallops or clicks. No pedal edema. Gastrointestinal system: Abdomen is nondistended, soft and nontender. No organomegaly or masses felt. Normal bowel sounds heard. Central nervous system: Alert and oriented. No focal neurological deficits. Extremities: Symmetric 5 x 5 power. Skin: No rashes, lesions or ulcers Psychiatry: Judgement and insight appear normal. Mood & affect appropriate.     Data Reviewed: I have personally reviewed following labs and imaging studies  CBC: Recent Labs  Lab 01/04/18 1234 01/05/18 0511  WBC 10.1 7.0  HGB 13.1 11.2*  HCT 43.5 36.9  MCV 87.7 88.5  PLT 258 814   Basic Metabolic Panel: Recent Labs  Lab 01/04/18 1217 01/04/18 1234 01/05/18 0511  NA  --  138 139  K  --  3.6 3.6  CL  --  104 106  CO2  --  25 27  GLUCOSE  --  152* 103*  BUN  --  17 13  CREATININE  --  0.59 0.57  CALCIUM  --  8.7* 8.1*  MG 1.7  --  2.0   GFR: Estimated Creatinine Clearance: 92.5 mL/min (by C-G formula based on SCr of 0.57 mg/dL). Liver Function Tests: Recent Labs  Lab 01/04/18 1234 01/05/18 0511  AST 30 17  ALT 24 17  ALKPHOS 92 71  BILITOT 0.7 0.9  PROT 7.2 6.0*  ALBUMIN 3.8 3.2*   Recent Labs  Lab 01/04/18 1234  LIPASE 27   No results for input(s): AMMONIA in the last 168 hours. Coagulation Profile: No results for  input(s): INR, PROTIME in the last 168 hours. Cardiac Enzymes: Recent Labs  Lab 01/04/18 1234  TROPONINI 0.03*   BNP (last 3 results) No results for input(s): PROBNP in the last 8760 hours. HbA1C: No results for input(s): HGBA1C in the last 72 hours. CBG: No results for input(s): GLUCAP in the last 168 hours. Lipid Profile: No results for input(s): CHOL, HDL, LDLCALC, TRIG, CHOLHDL, LDLDIRECT in the last 72 hours. Thyroid Function Tests: No results for input(s): TSH, T4TOTAL, FREET4, T3FREE, THYROIDAB in the last 72 hours. Anemia Panel: No results for input(s): VITAMINB12, FOLATE, FERRITIN, TIBC, IRON, RETICCTPCT in the last 72 hours. Sepsis Labs: No results for input(s): PROCALCITON, LATICACIDVEN in the last 168 hours.  No results found for this or any previous visit (from the past 240 hour(s)).       Radiology Studies: Dg Chest 2 View  Result Date: 01/04/2018 CLINICAL DATA:  Cough and chest pain EXAM: CHEST - 2 VIEW COMPARISON:  February 04, 2013 FINDINGS: There is no appreciable edema or consolidation. The heart size and pulmonary vascularity are normal. No adenopathy. There is degenerative change in the thoracic spine. IMPRESSION: No edema or consolidation. Electronically Signed   By: Lowella Grip III M.D.   On: 01/04/2018 14:35   Ct Abdomen Pelvis W Contrast  Result Date: 01/04/2018 CLINICAL DATA:  Nausea, vomiting, mid to right lower quadrant pain over the last 24 hours, history of ovarian carcinoma diagnosed 14 years ago EXAM: CT ABDOMEN AND PELVIS WITH CONTRAST TECHNIQUE: Multidetector CT imaging of the abdomen and pelvis was performed using the standard protocol following bolus administration of intravenous contrast. CONTRAST:  134mL ISOVUE-300 IOPAMIDOL (ISOVUE-300) INJECTION 61% COMPARISON:  CT abdomen pelvis of 02/04/2013 FINDINGS: Lower chest: There is minimal linear atelectasis involving the right lower lobe posteriorly. No pleural effusion is seen.  Hepatobiliary: The liver enhances with no focal abnormality. Mild hepatic steatosis and not be excluded. No calcified gallstones are seen. Pancreas: The pancreas is normal in size and the pancreatic duct is not dilated. There is a rounded calcification near the tail of the pancreas of 13 mm in diameter. It is difficult to determine if this represents a small adjacent splenic artery aneurysm or possibly a pancreatic calcified lesion. I would favor this representing a small aneurysm rather than a pancreatic lesion but MRI would be more sensitive for further evaluation. Spleen: The spleen is unremarkable. Adrenals/Urinary Tract: The adrenal glands appear normal. The kidneys enhance with no calculus or mass. On delayed images, the pelvocaliceal systems are unremarkable. The ureters appear normal in caliber. The urinary bladder is not well distended but no abnormality is seen. Stomach/Bowel: The stomach is somewhat distended with fluid. No gastric abnormality is noted. However, there is dilatation of proximal small bowel with decompressed distal small bowel, consistent with partial small bowel obstruction. Exact point of partial obstruction is difficult to definitely  ascertain but may be at the site of prior resection of small bowel in the mid anterior abdomen possibly due to adhesions. The colon is completely decompressed and there are scattered colonic diverticula throughout the colon. Vascular/Lymphatic: The abdominal aorta is normal in caliber with mild to moderate abdominal aortic atherosclerosis present. No adenopathy is seen. Reproductive: The uterus has previously been resected. No adnexal lesion is seen. There is small amount of fluid layering dependently within the pelvis. Other: There is a midline periumbilical hernia to the right containing a nondilated loop of transverse colon. Musculoskeletal: The lumbar vertebrae are in normal alignment. There is degenerative disc disease at L5-S1. The SI joints appear  corticated. IMPRESSION: 1. Partial small bowel obstruction with point of caliber change possibly in the anterior mid pelvis at site of prior resection of small bowel. 2. Small amount of free fluid within the pelvis. 3. Rounded calcification near the tail of the pancreas of 13 mm in diameter. This could represent an adjacent small splenic artery aneurysm but a splenic lesion can not be excluded. Consider MRI to assess further. 4. Small midline periumbilical hernia to the right containing a nondilated loop of transverse colon. 5. Mild to moderate abdominal aortic atherosclerosis. Electronically Signed   By: Ivar Drape M.D.   On: 01/04/2018 14:27        Scheduled Meds: Continuous Infusions: . lactated ringers 125 mL/hr at 01/05/18 0540     LOS: 0 days    Time spent: 30 minutes    Yousif Edelson Darleen Crocker, DO [facility] Pager [phone]  If 7PM-7AM, please contact night-coverage [email or web address] Password [email or web address] 01/05/2018, 11:20 AM
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
## 2018-01-06 LAB — BASIC METABOLIC PANEL
Anion gap: 7 (ref 5–15)
BUN: 13 mg/dL (ref 8–23)
CO2: 30 mmol/L (ref 22–32)
Calcium: 8.3 mg/dL — ABNORMAL LOW (ref 8.9–10.3)
Chloride: 103 mmol/L (ref 98–111)
Creatinine, Ser: 0.6 mg/dL (ref 0.44–1.00)
GFR calc Af Amer: >60 mL/min (ref >60)
GFR calc non Af Amer: >60 mL/min (ref >60)
Glucose, Bld: 97 mg/dL (ref 70–99)
POTASSIUM: 3.7 mmol/L (ref 3.5–5.1)
Sodium: 140 mmol/L (ref 135–145)

## 2018-01-06 LAB — HIV ANTIBODY (ROUTINE TESTING W REFLEX): HIV Screen 4th Generation wRfx: NONREACTIVE

## 2018-01-06 MED ORDER — LISINOPRIL 10 MG PO TABS
20.0000 mg | ORAL_TABLET | Freq: Every day | ORAL | Status: DC
  Administered 2018-01-06 – 2018-01-08 (×3): 20 mg via ORAL
  Filled 2018-01-06 (×3): qty 2

## 2018-01-06 MED ORDER — LEVOTHYROXINE SODIUM 25 MCG PO TABS
25.0000 ug | ORAL_TABLET | Freq: Every day | ORAL | Status: DC
  Administered 2018-01-07 – 2018-01-08 (×2): 25 ug via ORAL
  Filled 2018-01-06 (×2): qty 1

## 2018-01-06 MED ORDER — POLYETHYLENE GLYCOL 3350 17 G PO PACK
17.0000 g | PACK | Freq: Two times a day (BID) | ORAL | Status: DC
  Administered 2018-01-06 – 2018-01-07 (×4): 17 g via ORAL
  Filled 2018-01-06 (×5): qty 1

## 2018-01-06 MED ORDER — ASPIRIN EC 81 MG PO TBEC
81.0000 mg | DELAYED_RELEASE_TABLET | Freq: Every day | ORAL | Status: DC
  Administered 2018-01-06 – 2018-01-08 (×3): 81 mg via ORAL
  Filled 2018-01-06 (×3): qty 1

## 2018-01-06 MED ORDER — ZOLPIDEM TARTRATE 5 MG PO TABS
5.0000 mg | ORAL_TABLET | Freq: Once | ORAL | Status: AC
  Administered 2018-01-06: 5 mg via ORAL
  Filled 2018-01-06: qty 1

## 2018-01-06 NOTE — Plan of Care (Signed)

## 2018-01-06 NOTE — Progress Notes (Signed)
Subjective: Minimal flatus, no bowel movements.  No nausea or vomiting at the present time.  Objective: Vital signs in last 24 hours: Temp:  [98.4 F (36.9 C)-99.1 F (37.3 C)] 98.4 F (36.9 C) (12/19 0546) Pulse Rate:  [63-66] 66 (12/19 0546) Resp:  [16-18] 16 (12/19 0546) BP: (117-146)/(59-66) 122/66 (12/19 0546) SpO2:  [87 %-98 %] 97 % (12/19 0546) Last BM Date: 01/05/18  Intake/Output from previous day: No intake/output data recorded. Intake/Output this shift: No intake/output data recorded.  General appearance: alert, cooperative and no distress GI: Soft, nontender, nondistended.  Occasional bowel sounds appreciated.  Lab Results:  Recent Labs    01/04/18 1234 01/05/18 0511  WBC 10.1 7.0  HGB 13.1 11.2*  HCT 43.5 36.9  PLT 258 203   BMET Recent Labs    01/05/18 0511 01/06/18 0447  NA 139 140  K 3.6 3.7  CL 106 103  CO2 27 30  GLUCOSE 103* 97  BUN 13 13  CREATININE 0.57 0.60  CALCIUM 8.1* 8.3*   PT/INR No results for input(s): LABPROT, INR in the last 72 hours.  Studies/Results: Dg Chest 2 View  Result Date: 01/04/2018 CLINICAL DATA:  Cough and chest pain EXAM: CHEST - 2 VIEW COMPARISON:  February 04, 2013 FINDINGS: There is no appreciable edema or consolidation. The heart size and pulmonary vascularity are normal. No adenopathy. There is degenerative change in the thoracic spine. IMPRESSION: No edema or consolidation. Electronically Signed   By: Lowella Grip III M.D.   On: 01/04/2018 14:35   Ct Abdomen Pelvis W Contrast  Result Date: 01/04/2018 CLINICAL DATA:  Nausea, vomiting, mid to right lower quadrant pain over the last 24 hours, history of ovarian carcinoma diagnosed 14 years ago EXAM: CT ABDOMEN AND PELVIS WITH CONTRAST TECHNIQUE: Multidetector CT imaging of the abdomen and pelvis was performed using the standard protocol following bolus administration of intravenous contrast. CONTRAST:  16mL ISOVUE-300 IOPAMIDOL (ISOVUE-300) INJECTION  61% COMPARISON:  CT abdomen pelvis of 02/04/2013 FINDINGS: Lower chest: There is minimal linear atelectasis involving the right lower lobe posteriorly. No pleural effusion is seen. Hepatobiliary: The liver enhances with no focal abnormality. Mild hepatic steatosis and not be excluded. No calcified gallstones are seen. Pancreas: The pancreas is normal in size and the pancreatic duct is not dilated. There is a rounded calcification near the tail of the pancreas of 13 mm in diameter. It is difficult to determine if this represents a small adjacent splenic artery aneurysm or possibly a pancreatic calcified lesion. I would favor this representing a small aneurysm rather than a pancreatic lesion but MRI would be more sensitive for further evaluation. Spleen: The spleen is unremarkable. Adrenals/Urinary Tract: The adrenal glands appear normal. The kidneys enhance with no calculus or mass. On delayed images, the pelvocaliceal systems are unremarkable. The ureters appear normal in caliber. The urinary bladder is not well distended but no abnormality is seen. Stomach/Bowel: The stomach is somewhat distended with fluid. No gastric abnormality is noted. However, there is dilatation of proximal small bowel with decompressed distal small bowel, consistent with partial small bowel obstruction. Exact point of partial obstruction is difficult to definitely ascertain but may be at the site of prior resection of small bowel in the mid anterior abdomen possibly due to adhesions. The colon is completely decompressed and there are scattered colonic diverticula throughout the colon. Vascular/Lymphatic: The abdominal aorta is normal in caliber with mild to moderate abdominal aortic atherosclerosis present. No adenopathy is seen. Reproductive: The uterus has previously  been resected. No adnexal lesion is seen. There is small amount of fluid layering dependently within the pelvis. Other: There is a midline periumbilical hernia to the right  containing a nondilated loop of transverse colon. Musculoskeletal: The lumbar vertebrae are in normal alignment. There is degenerative disc disease at L5-S1. The SI joints appear corticated. IMPRESSION: 1. Partial small bowel obstruction with point of caliber change possibly in the anterior mid pelvis at site of prior resection of small bowel. 2. Small amount of free fluid within the pelvis. 3. Rounded calcification near the tail of the pancreas of 13 mm in diameter. This could represent an adjacent small splenic artery aneurysm but a splenic lesion can not be excluded. Consider MRI to assess further. 4. Small midline periumbilical hernia to the right containing a nondilated loop of transverse colon. 5. Mild to moderate abdominal aortic atherosclerosis. Electronically Signed   By: Ivar Drape M.D.   On: 01/04/2018 14:27    Anti-infectives: Anti-infectives (From admission, onward)   None      Assessment/Plan: Impression: Partial small bowel obstruction. Plan: No need for acute surgical intervention at this time.  Will give clear liquid diet and MiraLAX.  Awaiting return of bowel function.  LOS: 1 day    Aviva Signs 01/06/2018

## 2018-01-06 NOTE — Progress Notes (Signed)
PROGRESS NOTE    Annette Clark  EHU:[id number] DOB: 16-Nov-1950 DOA: 01/04/2018 PCP: Sharilyn Sites, MD   Brief Narrative:  Per HPI from Dr. Velia Meyer: Annette Borer Wilsonis a 67 y.o.femalewith medical history significant forovarian cancer status post total abdominal hysterectomy with bilateral salpingo-oophorectomy in 2006, partial colectomy in 2006, small bowel obstruction,[facility] bowelobstructionafter presenting [facility] complaining of abdominal discomfort.  She was admitted with partial small bowel obstruction and has been seen by general surgery this morning.  Her diet is being slowly advanced.  Assessment & Plan:   Principal Problem:   SBO (small bowel obstruction) (HCC) Active Problems:   HTN (hypertension)   Abdominal pain   Nausea & vomiting   Hypothyroidism  1. Abdominal pain secondary to partial SBO likely related to adhesions-improving.    Appreciate general surgery recommendations with diet advancement to clear liquids today.  Plan to advance further as tolerated.  Continue IV fluid.  MiraLAX given for bowel movement. 2. Essential hypertension.  Continue as needed hydralazine and will restart lisinopril. 3. Hypothyroidism.  Continue on Synthroid today.   DVT prophylaxis: SCDs Code Status: Full Family Communication: Husband at bedside Disposition Plan: Continue conservative management of partial SBO   Consultants:   General surgery  Procedures:   None  Antimicrobials:   None  Subjective: Patient seen and evaluated today with no new acute complaints or concerns. No acute concerns or events noted overnight.  She denies any abdominal pain, nausea, or vomiting and is passing flatus with no bowel movement noted as of yet.  Objective: Vitals:   01/05/18 2026 01/05/18 2119 01/06/18 0428 01/06/18 0546  BP:  (!) 146/63  122/66  Pulse:  63 63 66    Resp:  17  16  Temp:  99.1 F (37.3 C)  98.4 F (36.9 C)  TempSrc:  Oral  Oral  SpO2: (!) 87% 97% 98% 97%  Weight:      Height:        Intake/Output Summary (Last 24 hours) at 01/06/2018 1128 Last data filed at 01/06/2018 0826 Gross per 24 hour  Intake 0 ml  Output -  Net 0 ml   Filed Weights   01/04/18 1215  Weight: 129.7 kg    Examination:  General exam: Appears calm and comfortable  Respiratory system: Clear to auscultation. Respiratory effort normal. Cardiovascular system: S1 & S2 heard, RRR. No JVD, murmurs, rubs, gallops or clicks. No pedal edema. Gastrointestinal system: Abdomen is nondistended, soft and nontender. No organomegaly or masses felt. Normal bowel sounds heard. Central nervous system: Alert and oriented. No focal neurological deficits. Extremities: Symmetric 5 x 5 power. Skin: No rashes, lesions or ulcers Psychiatry: Judgement and insight appear normal. Mood & affect appropriate.     Data Reviewed: I have personally reviewed following labs and imaging studies  CBC: Recent Labs  Lab 01/04/18 1234 01/05/18 0511  WBC 10.1 7.0  HGB 13.1 11.2*  HCT 43.5 36.9  MCV 87.7 88.5  PLT 258 785   Basic Metabolic Panel: Recent Labs  Lab 01/04/18 1217 01/04/18 1234 01/05/18 0511 01/06/18 0447  NA  --  138 139 140  K  --  3.6 3.6 3.7  CL  --  104 106 103  CO2  --  25 27 30   GLUCOSE  --  152* 103* 97  BUN  --  17 13 13   CREATININE  --  0.59 0.57 0.60  CALCIUM  --  8.7* 8.1*  8.3*  MG 1.7  --  2.0  --    GFR: Estimated Creatinine Clearance: 92.5 mL/min (by C-G formula based on SCr of 0.6 mg/dL). Liver Function Tests: Recent Labs  Lab 01/04/18 1234 01/05/18 0511  AST 30 17  ALT 24 17  ALKPHOS 92 71  BILITOT 0.7 0.9  PROT 7.2 6.0*  ALBUMIN 3.8 3.2*   Recent Labs  Lab 01/04/18 1234  LIPASE 27   No results for input(s): AMMONIA in the last 168 hours. Coagulation Profile: No results for input(s): INR, PROTIME in the last 168  hours. Cardiac Enzymes: Recent Labs  Lab 01/04/18 1234  TROPONINI 0.03*   BNP (last 3 results) No results for input(s): PROBNP in the last 8760 hours. HbA1C: No results for input(s): HGBA1C in the last 72 hours. CBG: No results for input(s): GLUCAP in the last 168 hours. Lipid Profile: No results for input(s): CHOL, HDL, LDLCALC, TRIG, CHOLHDL, LDLDIRECT in the last 72 hours. Thyroid Function Tests: No results for input(s): TSH, T4TOTAL, FREET4, T3FREE, THYROIDAB in the last 72 hours. Anemia Panel: No results for input(s): VITAMINB12, FOLATE, FERRITIN, TIBC, IRON, RETICCTPCT in the last 72 hours. Sepsis Labs: No results for input(s): PROCALCITON, LATICACIDVEN in the last 168 hours.  No results found for this or any previous visit (from the past 240 hour(s)).       Radiology Studies: Dg Chest 2 View  Result Date: 01/04/2018 CLINICAL DATA:  Cough and chest pain EXAM: CHEST - 2 VIEW COMPARISON:  February 04, 2013 FINDINGS: There is no appreciable edema or consolidation. The heart size and pulmonary vascularity are normal. No adenopathy. There is degenerative change in the thoracic spine. IMPRESSION: No edema or consolidation. Electronically Signed   By: Lowella Grip III M.D.   On: 01/04/2018 14:35   Ct Abdomen Pelvis W Contrast  Result Date: 01/04/2018 CLINICAL DATA:  Nausea, vomiting, mid to right lower quadrant pain over the last 24 hours, history of ovarian carcinoma diagnosed 14 years ago EXAM: CT ABDOMEN AND PELVIS WITH CONTRAST TECHNIQUE: Multidetector CT imaging of the abdomen and pelvis was performed using the standard protocol following bolus administration of intravenous contrast. CONTRAST:  187mL ISOVUE-300 IOPAMIDOL (ISOVUE-300) INJECTION 61% COMPARISON:  CT abdomen pelvis of 02/04/2013 FINDINGS: Lower chest: There is minimal linear atelectasis involving the right lower lobe posteriorly. No pleural effusion is seen. Hepatobiliary: The liver enhances with no focal  abnormality. Mild hepatic steatosis and not be excluded. No calcified gallstones are seen. Pancreas: The pancreas is normal in size and the pancreatic duct is not dilated. There is a rounded calcification near the tail of the pancreas of 13 mm in diameter. It is difficult to determine if this represents a small adjacent splenic artery aneurysm or possibly a pancreatic calcified lesion. I would favor this representing a small aneurysm rather than a pancreatic lesion but MRI would be more sensitive for further evaluation. Spleen: The spleen is unremarkable. Adrenals/Urinary Tract: The adrenal glands appear normal. The kidneys enhance with no calculus or mass. On delayed images, the pelvocaliceal systems are unremarkable. The ureters appear normal in caliber. The urinary bladder is not well distended but no abnormality is seen. Stomach/Bowel: The stomach is somewhat distended with fluid. No gastric abnormality is noted. However, there is dilatation of proximal small bowel with decompressed distal small bowel, consistent with partial small bowel obstruction. Exact point of partial obstruction is difficult to definitely ascertain but may be at the site of prior resection of small bowel in the mid  anterior abdomen possibly due to adhesions. The colon is completely decompressed and there are scattered colonic diverticula throughout the colon. Vascular/Lymphatic: The abdominal aorta is normal in caliber with mild to moderate abdominal aortic atherosclerosis present. No adenopathy is seen. Reproductive: The uterus has previously been resected. No adnexal lesion is seen. There is small amount of fluid layering dependently within the pelvis. Other: There is a midline periumbilical hernia to the right containing a nondilated loop of transverse colon. Musculoskeletal: The lumbar vertebrae are in normal alignment. There is degenerative disc disease at L5-S1. The SI joints appear corticated. IMPRESSION: 1. Partial small bowel  obstruction with point of caliber change possibly in the anterior mid pelvis at site of prior resection of small bowel. 2. Small amount of free fluid within the pelvis. 3. Rounded calcification near the tail of the pancreas of 13 mm in diameter. This could represent an adjacent small splenic artery aneurysm but a splenic lesion can not be excluded. Consider MRI to assess further. 4. Small midline periumbilical hernia to the right containing a nondilated loop of transverse colon. 5. Mild to moderate abdominal aortic atherosclerosis. Electronically Signed   By: Ivar Drape M.D.   On: 01/04/2018 14:27        Scheduled Meds: . polyethylene glycol  17 g Oral BID   Continuous Infusions: . lactated ringers 75 mL/hr at 01/05/18 2230     LOS: 1 day    Time spent: 30 minutes    Shiva Karis Darleen Crocker, DO [facility] Pager [phone]  If 7PM-7AM, please contact night-coverage [email or web address] Password [email or web address] 01/06/2018, 11:28 AM

## 2018-01-07 LAB — BASIC METABOLIC PANEL
Anion gap: 7 (ref 5–15)
BUN: 6 mg/dL — ABNORMAL LOW (ref 8–23)
CO2: 28 mmol/L (ref 22–32)
CREATININE: 0.54 mg/dL (ref 0.44–1.00)
Calcium: 8.1 mg/dL — ABNORMAL LOW (ref 8.9–10.3)
Chloride: 102 mmol/L (ref 98–111)
GFR calc Af Amer: >60 mL/min (ref >60)
GFR calc non Af Amer: >60 mL/min (ref >60)
Glucose, Bld: 93 mg/dL (ref 70–99)
Potassium: 3.8 mmol/L (ref 3.5–5.1)
Sodium: 137 mmol/L (ref 135–145)

## 2018-01-07 MED ORDER — ZOLPIDEM TARTRATE 5 MG PO TABS
5.0000 mg | ORAL_TABLET | Freq: Every evening | ORAL | Status: DC | PRN
  Administered 2018-01-07: 5 mg via ORAL
  Filled 2018-01-07: qty 1

## 2018-01-07 NOTE — Care Management Important Message (Signed)
Important Message  Patient Details  Name: Annette Clark MRN: [id number] Date of Birth: 1950-06-11   Medicare Important Message Given:  Yes    Shelda Altes 01/07/2018, 2:19 PM

## 2018-01-07 NOTE — Progress Notes (Signed)
  Subjective: Patient having multiple episodes of flatus.  Has the sensation that she will be moving her bowels shortly.  Denies any abdominal pain, nausea or vomiting.  Objective: Vital signs in last 24 hours: Temp:  [98.4 F (36.9 C)-98.6 F (37 C)] 98.4 F (36.9 C) (12/20 0606) Pulse Rate:  [59-66] 66 (12/20 0606) Resp:  [16-18] 16 (12/20 0606) BP: (111-136)/(66-73) 136/69 (12/20 0606) SpO2:  [95 %-96 %] 96 % (12/20 0606) Last BM Date: 01/07/18  Intake/Output from previous day: 12/19 0701 - 12/20 0700 In: 480 [P.O.:480] Out: -  Intake/Output this shift: No intake/output data recorded.  General appearance: alert, cooperative and no distress GI: soft, non-tender; bowel sounds normal; no masses,  no organomegaly  Lab Results:  Recent Labs    01/04/18 1234 01/05/18 0511  WBC 10.1 7.0  HGB 13.1 11.2*  HCT 43.5 36.9  PLT 258 203   BMET Recent Labs    01/06/18 0447 01/07/18 0612  NA 140 137  K 3.7 3.8  CL 103 102  CO2 30 28  GLUCOSE 97 93  BUN 13 6*  CREATININE 0.60 0.54  CALCIUM 8.3* 8.1*   PT/INR No results for input(s): LABPROT, INR in the last 72 hours.  Studies/Results: No results found.  Anti-infectives: Anti-infectives (From admission, onward)   None      Assessment/Plan: Impression: Partial small bowel obstruction, resolving Plan: We will start full liquid diet and advance to a regular diet as tolerated.  No need for acute surgical intervention.  Should her bowel function return fully, she may be discharged.  LOS: 2 days    Aviva Signs 01/07/2018

## 2018-01-07 NOTE — Progress Notes (Signed)
PROGRESS NOTE    Annette Clark  ENI:[id number] DOB: 01/28/1950 DOA: 01/04/2018 PCP: Annette Sites, MD   Brief Narrative:  Per HPI from Annette Clark: Annette Borer Wilsonis a 67 y.o.Clark medical history significant forovarian cancer status post total abdominal hysterectomy with bilateral salpingo-oophorectomy in 2006, partial colectomy in 2006, small bowel obstruction,[facility] bowelobstructionafter presenting from hometo Annette Clark department complaining of abdominal discomfort.She was admitted with partial small bowel obstruction and has been seen by general surgery this morning.  Her diet is being slowly advanced.  Assessment & Plan:   Principal Problem:   SBO (small bowel obstruction) (HCC) Active Problems:   HTN (hypertension)   Abdominal pain   Nausea & vomiting   Hypothyroidism  1. Abdominal pain secondary to partial SBO likely related to adhesions-improving.   Appreciate general surgery recommendations with diet advancement to full liquid and then soft as tolerated.  Anticipate discharge by tomorrow if further improvement noted.  DC IV fluid. 2. Essential hypertension. Continue as needed hydralazine and will restart lisinopril. 3. Hypothyroidism. Continue on Synthroid today.  DVT prophylaxis:SCDs Code Status:Full Family Communication:Husband at bedside Disposition Plan:Continue conservative management of partial SBO   Consultants:  General surgery  Procedures:  None  Antimicrobials:   None  Subjective: Patient seen and evaluated today with no new acute complaints or concerns. No acute concerns or events noted overnight.  She has reportedly had multiple episodes of flatus and one bowel movement with no further nausea or vomiting.  She is tolerating her clear liquid diet.  Objective: Vitals:   01/06/18 0546 01/06/18 1346 01/06/18 2154 01/07/18 0606  BP: 122/66  111/73 133/66 136/69  Pulse: 66 (!) 59 66 66  Resp: 16  18 16   Temp: 98.4 F (36.9 C) 98.6 F (37 C) 98.6 F (37 C) 98.4 F (36.9 C)  TempSrc: Oral Oral Oral Oral  SpO2: 97% 95% 95% 96%  Weight:      Height:        Intake/Output Summary (Last 24 hours) at 01/07/2018 1039 Last data filed at 01/06/2018 1804 Gross per 24 hour  Intake 480 ml  Output -  Net 480 ml   Filed Weights   01/04/18 1215  Weight: 129.7 kg    Examination:  General exam: Appears calm and comfortable  Respiratory system: Clear to auscultation. Respiratory effort normal. Cardiovascular system: S1 & S2 heard, RRR. No JVD, murmurs, rubs, gallops or clicks. No pedal edema. Gastrointestinal system: Abdomen is nondistended, soft and nontender. No organomegaly or masses felt. Normal bowel sounds heard. Central nervous system: Alert and oriented. No focal neurological deficits. Extremities: Symmetric 5 x 5 power. Skin: No rashes, lesions or ulcers Psychiatry: Judgement and insight appear normal. Mood & affect appropriate.     Data Reviewed: I have personally reviewed following labs and imaging studies  CBC: Recent Labs  Lab 01/04/18 1234 01/05/18 0511  WBC 10.1 7.0  HGB 13.1 11.2*  HCT 43.5 36.9  MCV 87.7 88.5  PLT 258 614   Basic Metabolic Panel: Recent Labs  Lab 01/04/18 1217 01/04/18 1234 01/05/18 0511 01/06/18 0447 01/07/18 0612  NA  --  138 139 140 137  K  --  3.6 3.6 3.7 3.8  CL  --  104 106 103 102  CO2  --  25 27 30 28   GLUCOSE  --  152* 103* 97 93  BUN  --  17 13 13  6*  CREATININE  --  0.59 0.57 0.60 0.54  CALCIUM  --  8.7* 8.1* 8.3* 8.1*  MG 1.7  --  2.0  --   --    GFR: Estimated Creatinine Clearance: 92.5 mL/min (by C-G formula based on SCr of 0.54 mg/dL). Liver Function Tests: Recent Labs  Lab 01/04/18 1234 01/05/18 0511  AST 30 17  ALT 24 17  ALKPHOS 92 71  BILITOT 0.7 0.9  PROT 7.2 6.0*  ALBUMIN 3.8 3.2*   Recent Labs  Lab 01/04/18 1234  LIPASE 27   No  results for input(s): AMMONIA in the last 168 hours. Coagulation Profile: No results for input(s): INR, PROTIME in the last 168 hours. Cardiac Enzymes: Recent Labs  Lab 01/04/18 1234  TROPONINI 0.03*   BNP (last 3 results) No results for input(s): PROBNP in the last 8760 hours. HbA1C: No results for input(s): HGBA1C in the last 72 hours. CBG: No results for input(s): GLUCAP in the last 168 hours. Lipid Profile: No results for input(s): CHOL, HDL, LDLCALC, TRIG, CHOLHDL, LDLDIRECT in the last 72 hours. Thyroid Function Tests: No results for input(s): TSH, T4TOTAL, FREET4, T3FREE, THYROIDAB in the last 72 hours. Anemia Panel: No results for input(s): VITAMINB12, FOLATE, FERRITIN, TIBC, IRON, RETICCTPCT in the last 72 hours. Sepsis Labs: No results for input(s): PROCALCITON, LATICACIDVEN in the last 168 hours.  No results found for this or any previous visit (from the past 240 hour(s)).       Radiology Studies: No results found.      Scheduled Meds: . aspirin EC  81 mg Oral Daily  . levothyroxine  25 mcg Oral QAC breakfast  . lisinopril  20 mg Oral Daily  . polyethylene glycol  17 g Oral BID   Continuous Infusions:   LOS: 2 days    Time spent: 30 minutes    Annette Downie Darleen Crocker, DO [facility] Pager [phone]  If 7PM-7AM, please contact night-coverage [email or web address] Password [facility] 01/07/2018, 10:39 AM

## 2018-01-08 NOTE — Progress Notes (Signed)
IV removed by Primitivo Gauze, NT, patient tolerated well. Reviewed AVS with patient who verbalized understanding and all questions answered.  Patient to be transported home by her husband.

## 2018-01-08 NOTE — Discharge Summary (Signed)
Physician Discharge Summary  Annette Clark VPX:[id number] DOB: Jun 04, 1950 DOA: 01/04/2018  PCP: Sharilyn Sites, MD  Admit date: 01/04/2018  Discharge date: 01/08/2018  Admitted From:Home  Disposition:  Home  Recommendations for Outpatient Follow-up:  1. Follow up with PCP in 1-2 weeks 2. Continue home medications as previously prescribed  Home Health: None  Equipment/Devices: None  Discharge Condition: Stable  CODE STATUS: Full  Diet recommendation: Heart Healthy  Brief/Interim Summary: Per HPI from Dr. Velia Meyer: Annette Borer Wilsonis a 67 y.o.femalewith medical history significant forovarian cancer status post total abdominal hysterectomy with bilateral salpingo-oophorectomy in 2006, partial colectomy in 2006, small bowel obstruction,[facility] bowel[facility]structionafter [facility] complaining of abdominal discomfort.She was admitted with partial small bowel obstruction and has been seen by general surgery and underwent conservative management with slow diet advancement.  She is now tolerating a regular diet and having bowel movements with no further symptoms.  No other acute events throughout this brief admission noted.  She will resume her usual home medications and follow-up with PCP in 1 to 2 weeks.  Discharge Diagnoses:  Principal Problem:   SBO (small bowel obstruction) (HCC) Active Problems:   HTN (hypertension)   Abdominal pain   Nausea & vomiting   Hypothyroidism  Principal discharge diagnosis: Partial small bowel obstruction-resolved.  Discharge Instructions  Discharge Instructions    Diet - low sodium heart healthy   Complete by:  As directed    Increase activity slowly   Complete by:  As directed      Allergies as of 01/08/2018   No Known Allergies     Medication List    TAKE these medications   acetaminophen 325 MG tablet Commonly known  as:  TYLENOL Take 650 mg by mouth every 6 (six) hours as needed for mild pain or moderate pain.   aspirin EC 81 MG tablet Take 81 mg by mouth daily.   halobetasol 0.05 % cream Commonly known as:  ULTRAVATE Apply 1 application topically daily as needed (for irritation).   levothyroxine 25 MCG tablet Commonly known as:  SYNTHROID, LEVOTHROID Take 25 mcg by mouth daily before breakfast.   lisinopril 20 MG tablet Commonly known as:  PRINIVIL,ZESTRIL Take 20 mg by mouth daily.   Vitamin D 50 MCG (2000 UT) Caps Take 2,000 Units by mouth daily.   zinc gluconate 50 MG tablet Take 50 mg by mouth daily.      Follow-up Information    Sharilyn Sites, MD Follow up.   Specialty:  Family Medicine Contact information: [address] [place] [address] [phone]          No Known Allergies  Consultations:  General surgery   Procedures/Studies: Dg Chest 2 View  Result Date: 01/04/2018 CLINICAL DATA:  Cough and chest pain EXAM: CHEST - 2 VIEW COMPARISON:  February 04, 2013 FINDINGS: There is no appreciable edema or consolidation. The heart size and pulmonary vascularity are normal. No adenopathy. There is degenerative change in the thoracic spine. IMPRESSION: No edema or consolidation. Electronically Signed   By: Lowella Grip III M.D.   On: 01/04/2018 14:35   Ct Abdomen Pelvis W Contrast  Result Date: 01/04/2018 CLINICAL DATA:  Nausea, vomiting, mid to right lower quadrant pain over the last 24 hours, history of ovarian carcinoma diagnosed 14 years ago EXAM: CT ABDOMEN AND PELVIS WITH CONTRAST TECHNIQUE: Multidetector CT imaging of the abdomen and pelvis was performed using the standard protocol following bolus administration  of intravenous contrast. CONTRAST:  181mL ISOVUE-300 IOPAMIDOL (ISOVUE-300) INJECTION 61% COMPARISON:  CT abdomen pelvis of 02/04/2013 FINDINGS: Lower chest: There is minimal linear atelectasis involving the right lower lobe posteriorly. No  pleural effusion is seen. Hepatobiliary: The liver enhances with no focal abnormality. Mild hepatic steatosis and not be excluded. No calcified gallstones are seen. Pancreas: The pancreas is normal in size and the pancreatic duct is not dilated. There is a rounded calcification near the tail of the pancreas of 13 mm in diameter. It is difficult to determine if this represents a small adjacent splenic artery aneurysm or possibly a pancreatic calcified lesion. I would favor this representing a small aneurysm rather than a pancreatic lesion but MRI would be more sensitive for further evaluation. Spleen: The spleen is unremarkable. Adrenals/Urinary Tract: The adrenal glands appear normal. The kidneys enhance with no calculus or mass. On delayed images, the pelvocaliceal systems are unremarkable. The ureters appear normal in caliber. The urinary bladder is not well distended but no abnormality is seen. Stomach/Bowel: The stomach is somewhat distended with fluid. No gastric abnormality is noted. However, there is dilatation of proximal small bowel with decompressed distal small bowel, consistent with partial small bowel obstruction. Exact point of partial obstruction is difficult to definitely ascertain but may be at the site of prior resection of small bowel in the mid anterior abdomen possibly due to adhesions. The colon is completely decompressed and there are scattered colonic diverticula throughout the colon. Vascular/Lymphatic: The abdominal aorta is normal in caliber with mild to moderate abdominal aortic atherosclerosis present. No adenopathy is seen. Reproductive: The uterus has previously been resected. No adnexal lesion is seen. There is small amount of fluid layering dependently within the pelvis. Other: There is a midline periumbilical hernia to the right containing a nondilated loop of transverse colon. Musculoskeletal: The lumbar vertebrae are in normal alignment. There is degenerative disc disease at  L5-S1. The SI joints appear corticated. IMPRESSION: 1. Partial small bowel obstruction with point of caliber change possibly in the anterior mid pelvis at site of prior resection of small bowel. 2. Small amount of free fluid within the pelvis. 3. Rounded calcification near the tail of the pancreas of 13 mm in diameter. This could represent an adjacent small splenic artery aneurysm but a splenic lesion can not be excluded. Consider MRI to assess further. 4. Small midline periumbilical hernia to the right containing a nondilated loop of transverse colon. 5. Mild to moderate abdominal aortic atherosclerosis. Electronically Signed   By: Ivar Drape M.D.   On: 01/04/2018 14:27     Discharge Exam: Vitals:   01/08/18 0607 01/08/18 0906  BP: 113/71   Pulse: 68   Resp: 18   Temp: 98.6 F (37 C)   SpO2: 97% 96%   Vitals:   01/07/18 0606 01/07/18 2204 01/08/18 0607 01/08/18 0906  BP: 136/69 (!) 124/56 113/71   Pulse: 66 64 68   Resp: 16 18 18    Temp: 98.4 F (36.9 C) 98.2 F (36.8 C) 98.6 F (37 C)   TempSrc: Oral Oral Oral   SpO2: 96% 94% 97% 96%  Weight:      Height:        General: Pt is alert, awake, not in acute distress Cardiovascular: RRR, S1/S2 +, no rubs, no gallops Respiratory: CTA bilaterally, no wheezing, no rhonchi Abdominal: Soft, NT, ND, bowel sounds + Extremities: no edema, no cyanosis    The results of significant diagnostics from this hospitalization (including imaging, microbiology,  ancillary and laboratory) are listed below for reference.     Microbiology: No results found for this or any previous visit (from the past 240 hour(s)).   Labs: BNP (last 3 results) No results for input(s): BNP in the last 8760 hours. Basic Metabolic Panel: Recent Labs  Lab 01/04/18 1217 01/04/18 1234 01/05/18 0511 01/06/18 0447 01/07/18 0612  NA  --  138 139 140 137  K  --  3.6 3.6 3.7 3.8  CL  --  104 106 103 102  CO2  --  25 27 30 28   GLUCOSE  --  152* 103* 97 93  BUN   --  17 13 13  6*  CREATININE  --  0.59 0.57 0.60 0.54  CALCIUM  --  8.7* 8.1* 8.3* 8.1*  MG 1.7  --  2.0  --   --    Liver Function Tests: Recent Labs  Lab 01/04/18 1234 01/05/18 0511  AST 30 17  ALT 24 17  ALKPHOS 92 71  BILITOT 0.7 0.9  PROT 7.2 6.0*  ALBUMIN 3.8 3.2*   Recent Labs  Lab 01/04/18 1234  LIPASE 27   No results for input(s): AMMONIA in the last 168 hours. CBC: Recent Labs  Lab 01/04/18 1234 01/05/18 0511  WBC 10.1 7.0  HGB 13.1 11.2*  HCT 43.5 36.9  MCV 87.7 88.5  PLT 258 203   Cardiac Enzymes: Recent Labs  Lab 01/04/18 1234  TROPONINI 0.03*   BNP: Invalid input(s): POCBNP CBG: No results for input(s): GLUCAP in the last 168 hours. D-Dimer No results for input(s): DDIMER in the last 72 hours. Hgb A1c No results for input(s): HGBA1C in the last 72 hours. Lipid Profile No results for input(s): CHOL, HDL, LDLCALC, TRIG, CHOLHDL, LDLDIRECT in the last 72 hours. Thyroid function studies No results for input(s): TSH, T4TOTAL, T3FREE, THYROIDAB in the last 72 hours.  Invalid input(s): FREET3 Anemia work up No results for input(s): VITAMINB12, FOLATE, FERRITIN, TIBC, IRON, RETICCTPCT in the last 72 hours. Urinalysis    Component Value Date/Time   COLORURINE YELLOW 01/04/2018 1217   APPEARANCEUR HAZY (A) 01/04/2018 1217   LABSPEC 1.020 01/04/2018 1217   PHURINE 7.0 01/04/2018 1217   GLUCOSEU 50 (A) 01/04/2018 1217   HGBUR NEGATIVE 01/04/2018 1217   BILIRUBINUR NEGATIVE 01/04/2018 1217   KETONESUR NEGATIVE 01/04/2018 1217   PROTEINUR NEGATIVE 01/04/2018 1217   UROBILINOGEN 0.2 10/31/2013 0533   NITRITE POSITIVE (A) 01/04/2018 1217   LEUKOCYTESUR NEGATIVE 01/04/2018 1217   Sepsis Labs Invalid input(s): PROCALCITONIN,  WBC,  LACTICIDVEN Microbiology No results found for this or any previous visit (from the past 240 hour(s)).   Time coordinating discharge: 35 minutes  SIGNED:   Rodena Goldmann, DO [facility] 01/08/2018, 9:34  AM Pager [phone]  If 7PM-7AM, please contact night-coverage [email or web address] Password [email or web address]

## 2018-01-08 NOTE — Progress Notes (Signed)
  Subjective: Denies any abdominal pain.  Tolerating diet well.  Has had multiple bowel movements.  Objective: Vital signs in last 24 hours: Temp:  [98.2 F (36.8 C)-98.6 F (37 C)] 98.6 F (37 C) (12/21 0607) Pulse Rate:  [64-68] 68 (12/21 0607) Resp:  [18] 18 (12/21 0607) BP: (113-124)/(56-71) 113/71 (12/21 0607) SpO2:  [94 %-97 %] 96 % (12/21 0906) Last BM Date: 01/07/18  Intake/Output from previous day: 12/20 0701 - 12/21 0700 In: 480 [P.O.:480] Out: -  Intake/Output this shift: No intake/output data recorded.  General appearance: alert, cooperative and no distress GI: soft, non-tender; bowel sounds normal; no masses,  no organomegaly  Lab Results:  No results for input(s): WBC, HGB, HCT, PLT in the last 72 hours. BMET Recent Labs    01/06/18 0447 01/07/18 0612  NA 140 137  K 3.7 3.8  CL 103 102  CO2 30 28  GLUCOSE 97 93  BUN 13 6*  CREATININE 0.60 0.54  CALCIUM 8.3* 8.1*   PT/INR No results for input(s): LABPROT, INR in the last 72 hours.  Studies/Results: No results found.  Anti-infectives: Anti-infectives (From admission, onward)   None      Assessment/Plan: Impression: Partial small bowel obstruction resolved. Plan: Okay for discharge from surgery standpoint.    LOS: 3 days    Aviva Signs 01/08/2018

## 2018-01-14 DIAGNOSIS — E785 Hyperlipidemia, unspecified: Secondary | ICD-10-CM | POA: Diagnosis not present

## 2018-01-14 DIAGNOSIS — E559 Vitamin D deficiency, unspecified: Secondary | ICD-10-CM | POA: Diagnosis not present

## 2018-01-14 DIAGNOSIS — R7309 Other abnormal glucose: Secondary | ICD-10-CM | POA: Diagnosis not present

## 2018-01-14 DIAGNOSIS — Z6841 Body Mass Index (BMI) 40.0 and over, adult: Secondary | ICD-10-CM | POA: Diagnosis not present

## 2018-01-14 DIAGNOSIS — E039 Hypothyroidism, unspecified: Secondary | ICD-10-CM | POA: Diagnosis not present

## 2018-01-14 DIAGNOSIS — K56609 Unspecified intestinal obstruction, unspecified as to partial versus complete obstruction: Secondary | ICD-10-CM | POA: Diagnosis not present

## 2018-01-14 DIAGNOSIS — J069 Acute upper respiratory infection, unspecified: Secondary | ICD-10-CM | POA: Diagnosis not present

## 2018-01-14 DIAGNOSIS — I10 Essential (primary) hypertension: Secondary | ICD-10-CM | POA: Diagnosis not present

## 2018-01-14 DIAGNOSIS — Z8543 Personal history of malignant neoplasm of ovary: Secondary | ICD-10-CM | POA: Diagnosis not present

## 2018-01-14 DIAGNOSIS — M1991 Primary osteoarthritis, unspecified site: Secondary | ICD-10-CM | POA: Diagnosis not present

## 2018-01-21 ENCOUNTER — Other Ambulatory Visit (HOSPITAL_COMMUNITY): Payer: Self-pay | Admitting: Family Medicine

## 2018-01-21 DIAGNOSIS — Z1231 Encounter for screening mammogram for malignant neoplasm of breast: Secondary | ICD-10-CM

## 2018-01-27 ENCOUNTER — Emergency Department (HOSPITAL_COMMUNITY): Payer: Medicare Other

## 2018-01-27 ENCOUNTER — Other Ambulatory Visit: Payer: Self-pay

## 2018-01-27 ENCOUNTER — Encounter (HOSPITAL_COMMUNITY): Payer: Self-pay | Admitting: Emergency Medicine

## 2018-01-27 ENCOUNTER — Emergency Department (HOSPITAL_COMMUNITY)
Admission: EM | Admit: 2018-01-27 | Discharge: 2018-01-27 | Disposition: A | Discharge status: Home / Self Care | Payer: Medicare Other | Attending: Emergency Medicine | Admitting: Emergency Medicine

## 2018-01-27 DIAGNOSIS — Z8543 Personal history of malignant neoplasm of ovary: Secondary | ICD-10-CM | POA: Diagnosis not present

## 2018-01-27 DIAGNOSIS — Z87891 Personal history of nicotine dependence: Secondary | ICD-10-CM | POA: Insufficient documentation

## 2018-01-27 DIAGNOSIS — R112 Nausea with vomiting, unspecified: Secondary | ICD-10-CM

## 2018-01-27 DIAGNOSIS — E039 Hypothyroidism, unspecified: Secondary | ICD-10-CM | POA: Insufficient documentation

## 2018-01-27 DIAGNOSIS — R140 Abdominal distension (gaseous): Secondary | ICD-10-CM | POA: Diagnosis not present

## 2018-01-27 DIAGNOSIS — Z79899 Other long term (current) drug therapy: Secondary | ICD-10-CM | POA: Diagnosis not present

## 2018-01-27 DIAGNOSIS — R1013 Epigastric pain: Secondary | ICD-10-CM | POA: Diagnosis present

## 2018-01-27 DIAGNOSIS — I10 Essential (primary) hypertension: Secondary | ICD-10-CM | POA: Diagnosis not present

## 2018-01-27 DIAGNOSIS — Z7982 Long term (current) use of aspirin: Secondary | ICD-10-CM | POA: Insufficient documentation

## 2018-01-27 LAB — COMPREHENSIVE METABOLIC PANEL
ALBUMIN: 4.1 g/dL (ref 3.5–5.0)
ALT: 23 U/L (ref 0–44)
AST: 25 U/L (ref 15–41)
Alkaline Phosphatase: 96 U/L (ref 38–126)
Anion gap: 8 (ref 5–15)
BUN: 16 mg/dL (ref 8–23)
CO2: 29 mmol/L (ref 22–32)
Calcium: 8.8 mg/dL — ABNORMAL LOW (ref 8.9–10.3)
Chloride: 100 mmol/L (ref 98–111)
Creatinine, Ser: 0.63 mg/dL (ref 0.44–1.00)
GFR calc Af Amer: >60 mL/min (ref >60)
GFR calc non Af Amer: >60 mL/min (ref >60)
GLUCOSE: 137 mg/dL — AB (ref 70–99)
Potassium: 3.9 mmol/L (ref 3.5–5.1)
Sodium: 137 mmol/L (ref 135–145)
Total Bilirubin: 0.9 mg/dL (ref 0.3–1.2)
Total Protein: 7.9 g/dL (ref 6.5–8.1)

## 2018-01-27 LAB — CBC
HEMATOCRIT: 46.6 % — AB (ref 36.0–46.0)
Hemoglobin: 14.2 g/dL (ref 12.0–15.0)
MCH: 27 pg (ref 26.0–34.0)
MCHC: 30.5 g/dL (ref 30.0–36.0)
MCV: 88.8 fL (ref 80.0–100.0)
Platelets: 279 10*3/uL (ref 150–400)
RBC: 5.25 MIL/uL — AB (ref 3.87–5.11)
RDW: 13.7 % (ref 11.5–15.5)
WBC: 11.1 10*3/uL — ABNORMAL HIGH (ref 4.0–10.5)
nRBC: 0 % (ref 0.0–0.2)

## 2018-01-27 LAB — LIPASE, BLOOD: Lipase: 32 U/L (ref 11–51)

## 2018-01-27 MED ORDER — ONDANSETRON 4 MG PO TBDP: 4.0000 mg | ORAL_TABLET | Freq: Three times a day (TID) | ORAL | 0 refills | Status: DC | PRN

## 2018-01-27 MED ORDER — ONDANSETRON HCL 4 MG/2ML IJ SOLN
4.0000 mg | Freq: Once | INTRAMUSCULAR | Status: AC
  Administered 2018-01-27: 4 mg via INTRAVENOUS
  Filled 2018-01-27: qty 2
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
## 2018-01-27 NOTE — ED Triage Notes (Signed)
Pt states that she has been having abd pain today and she has been vomiting for the past couple of hours

## 2018-01-27 NOTE — Discharge Instructions (Signed)
Your testing today shows that you may have a very early bowel obstruction.  You have chosen to go home which is totally reasonable as long as you return if you have increased pain or vomiting that is not controlled.  Please be aware that for the next 24 hours you should drink nothing but small amounts of clear liquids.  If you are unable to tolerate this and continued to have vomiting or worsening symptoms please return to the emergency department immediately.  You may take Zofran, 4 mg every 6 hours as needed, I have sent you home with 4 tablets for this evening and tomorrow morning  Thank you for letting us take care of you today!  Please obtain all of your results from medical records or have your doctors office obtain the results - share them with your doctor - you should be seen at your doctors office in the next 2 days. Call today to arrange your follow up. Take the medications as prescribed. Please review all of the medicines and only take them if you do not have an allergy to them. Please be aware that if you are taking birth control pills, taking other prescriptions, ESPECIALLY ANTIBIOTICS may make the birth control ineffective - if this is the case, either do not engage in sexual activity or use alternative methods of birth control such as condoms until you have finished the medicine and your family doctor says it is OK to restart them. If you are on a blood thinner such as COUMADIN, be aware that any other medicine that you take may cause the coumadin to either work too much, or not enough - you should have your coumadin level rechecked in next 7 days if this is the case.  ?  It is also a possibility that you have an allergic reaction to any of the medicines that you have been prescribed - Everybody reacts differently to medications and while MOST people have no trouble with most medicines, you may have a reaction such as nausea, vomiting, rash, swelling, shortness of breath. If this is the case,  please stop taking the medicine immediately and contact your physician.   If you were given a medication in the ED such as percocet, vicodin, or morphine, be aware that these medicines are sedating and may change your ability to take care of yourself adequately for several hours after being given this medicines - you should not drive or take care of small children if you were given this medicine in the Emergency Department or if you have been prescribed these types of medicines. ?   You should return to the ER IMMEDIATELY if you develop severe or worsening symptoms.

## 2018-01-28 ENCOUNTER — Telehealth: Payer: Self-pay | Admitting: Internal Medicine

## 2018-01-28 DIAGNOSIS — R9389 Abnormal findings on diagnostic imaging of other specified body structures: Secondary | ICD-10-CM

## 2018-01-28 DIAGNOSIS — K56609 Unspecified intestinal obstruction, unspecified as to partial versus complete obstruction: Secondary | ICD-10-CM

## 2018-01-28 NOTE — Telephone Encounter (Signed)
  Reviewed patient's recent admission records, including CT January 04, 2018. Reviewed patient's ED visit.  Spoke with patient.  She feels much better today.  No further vomiting.  No abdominal pain.  Discussed precautions for which she should turn to the ED.   Patient needs MRI  abdomen with pancreatic protocol.  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  Radiologist recommended MRI.PLESE SCHEDULE FOR 3 WEEKS FROM NOW.   To discuss any further management of recurrent partial small bowel obstruction with Dr. Gala Romney.

## 2018-01-28 NOTE — Telephone Encounter (Signed)
Pt has OV on 2/7 and she's on a cancellation list. She went to the ER and was told she has the early stages of having another bowel blockage. She would like to speak with nurse to see what RMR would recommend.   [phone]

## 2018-01-28 NOTE — Telephone Encounter (Signed)
LSL, pt was calling back because she mentioned a CT was discussed at her last apt if she continued to have problems. She was in the hospital for 4 days 12/17 for her bowel obstruction. She went to the ED last night and they said the same thing was starting to happen with her bowels. They gave her nausea medication for the nausea and pt is currently on clear liquids per the ED department.

## 2018-01-30 NOTE — Telephone Encounter (Signed)
Let patient know that I discussed recurrent partial SBO with RMR and he advised referral to [facility] for consultation.

## 2018-01-31 MED FILL — Ondansetron HCl Tab 4 MG: ORAL | Qty: 4 | Status: AC

## 2018-01-31 NOTE — Telephone Encounter (Signed)
Noted  

## 2018-01-31 NOTE — Telephone Encounter (Signed)
MRI scheduled for 02/18/2018 at 9:00am, arrival time 8:30am, npo 4 hrs prior.  Patient aware of appt details. Patient also agreeable to referral. Referral sent.

## 2018-01-31 NOTE — Addendum Note (Signed)
Addended by: Inge Rise on: 01/31/2018 08:26 AM   Modules accepted: Orders

## 2018-02-07 ENCOUNTER — Encounter (HOSPITAL_COMMUNITY): Payer: Self-pay

## 2018-02-07 ENCOUNTER — Ambulatory Visit (HOSPITAL_COMMUNITY)
Admission: RE | Admit: 2018-02-07 | Discharge: 2018-02-07 | Disposition: A | Discharge status: Home / Self Care | Payer: Medicare Other | Source: Ambulatory Visit | Attending: Family Medicine | Admitting: Family Medicine

## 2018-02-07 DIAGNOSIS — Z1231 Encounter for screening mammogram for malignant neoplasm of breast: Secondary | ICD-10-CM | POA: Insufficient documentation

## 2018-02-18 ENCOUNTER — Ambulatory Visit (HOSPITAL_COMMUNITY)
Admission: RE | Admit: 2018-02-18 | Discharge: 2018-02-18 | Disposition: A | Discharge status: Home / Self Care | Payer: Medicare Other | Source: Ambulatory Visit | Attending: Gastroenterology | Admitting: Gastroenterology

## 2018-02-18 ENCOUNTER — Other Ambulatory Visit: Payer: Self-pay | Admitting: Gastroenterology

## 2018-02-18 DIAGNOSIS — R9389 Abnormal findings on diagnostic imaging of other specified body structures: Secondary | ICD-10-CM

## 2018-02-18 DIAGNOSIS — K429 Umbilical hernia without obstruction or gangrene: Secondary | ICD-10-CM | POA: Diagnosis not present

## 2018-02-18 DIAGNOSIS — R932 Abnormal findings on diagnostic imaging of liver and biliary tract: Secondary | ICD-10-CM | POA: Diagnosis not present

## 2018-02-18 MED ORDER — GADOBUTROL 1 MMOL/ML IV SOLN
10.0000 mL | Freq: Once | INTRAVENOUS | Status: AC | PRN
  Administered 2018-02-18: 10 mL via INTRAVENOUS

## 2018-02-21 DIAGNOSIS — K56609 Unspecified intestinal obstruction, unspecified as to partial versus complete obstruction: Secondary | ICD-10-CM | POA: Diagnosis not present

## 2018-02-25 ENCOUNTER — Ambulatory Visit (INDEPENDENT_AMBULATORY_CARE_PROVIDER_SITE_OTHER): Payer: Medicare Other | Admitting: Gastroenterology

## 2018-02-25 ENCOUNTER — Encounter

## 2018-02-25 ENCOUNTER — Encounter: Payer: Self-pay | Admitting: Gastroenterology

## 2018-02-25 VITALS — BP 132/76 | HR 73 | Temp 97.8°F | Ht 65.0 in | Wt 284.6 lb

## 2018-02-25 DIAGNOSIS — K56609 Unspecified intestinal obstruction, unspecified as to partial versus complete obstruction: Secondary | ICD-10-CM | POA: Diagnosis not present

## 2018-02-25 MED ORDER — ONDANSETRON 4 MG PO TBDP: 4.0000 mg | ORAL_TABLET | Freq: Four times a day (QID) | ORAL | 1 refills | Status: DC | PRN

## 2018-02-25 NOTE — Progress Notes (Signed)
Primary Care Physician: Sharilyn Sites, MD  Primary Gastroenterologist:  Garfield Cornea, MD   Chief Complaint  Patient presents with  . bowel obstructions    occurred x3    HPI: Annette Clark is a 68 y.o. female here for follow-up.  She was last seen in November 2019.  She has a remote history of ovarian cancer status post resection, required part of her colon vs small bowel removed at that time, chemotherapy (approximately 13 years ago).  Postoperatively she did have a small bowel obstruction which was treated medically.  She has SBO in 2015 as well also treated conservatively. Colonoscopy in 2014 with recommendations of 10-year follow-up, no evidence of colonic anastomosis seen.    When I saw her back in November she reported recent episode of colicky crampy abdominal pain in the lower abdomen with nausea.  Symptoms were occurring every few days.  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  Because she was asymptomatic when I saw her we did not pursue any imaging.  Approximately 1 month after she was seen with recurrent symptoms.  CT scan showed partial small bowel obstruction with point of caliber change possibly in the anterior mid pelvis at site of prior resection of small bowel.  Incidentally found to have rounded calcification near the tail of the pancreas measuring 13 mm.  Small midline periumbilical hernia to the right containing nondilated loop of transverse colon. Treated conservatively. She was seen in ED 01/27/2018 with similar symptoms, more short lived.   She reports in between episodes she is fine. Denies issues moving her bowels.  Developed acute onset abdominal pain followed by nausea, then vomiting.  She has unable to manage at home on couple of occasions with antiemetics, sips of clear liquids.  She denies melena, rectal bleeding, unintentional weight loss.  Denies heartburn, dysphagia.  We did do an MRI abdomen with  and without contrast on January 31 to follow-up on calcification near the tail the pancreas seen on CT.  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  Plan to follow with CTA abdomen in 1 year.  After her December admission and January ED visit Dr. Gala Romney suggested she see general surgery at [facility] for further evaluation of recurrent bowel obstructions.  She saw Dr. Leighton Ruff.  No recommendations for surgical intervention at this time.  He was advised to continue a low fiber diet.   Current Outpatient Medications  Medication Sig Dispense Refill  . acetaminophen (TYLENOL) 325 MG tablet Take 650 mg by mouth every 6 (six) hours as needed for mild pain or moderate pain.     Marland Kitchen aspirin EC 81 MG tablet Take 81 mg by mouth daily.     . halobetasol (ULTRAVATE) 0.05 % cream Apply 1 application topically daily as needed (for irritation).     Marland Kitchen levothyroxine (SYNTHROID, LEVOTHROID) 25 MCG tablet Take 25 mcg by mouth daily before breakfast.   0  . lisinopril (PRINIVIL,ZESTRIL) 20 MG tablet Take 20 mg by mouth daily.   1  . ondansetron (ZOFRAN ODT) 4 MG disintegrating tablet Take 1 tablet (4 mg total) by mouth every 8 (eight) hours as needed for nausea. 4 tablet 0  . ondansetron (ZOFRAN ODT) 4 MG disintegrating tablet Take 1 tablet (4 mg total) by mouth every 8 (eight) hours as needed for nausea. 10 tablet 0  . Vitamin D, Ergocalciferol, (DRISDOL) 1.25 MG (50000  UT) CAPS capsule Take 50,000 Units by mouth every 7 (seven) days. Mondays    . zinc gluconate 50 MG tablet Take 50 mg by mouth daily.     No current facility-administered medications for this visit.     Allergies as of 02/25/2018  . (No Known Allergies)    ROS:  General: Negative for anorexia, weight loss, fever, chills, fatigue, weakness. ENT: Negative for hoarseness, difficulty swallowing , nasal congestion. CV: Negative for chest pain, angina, palpitations, dyspnea on exertion, peripheral  edema.  Respiratory: Negative for dyspnea at rest, dyspnea on exertion, cough, sputum, wheezing.  GI: See history of present illness. GU:  Negative for dysuria, hematuria, urinary incontinence, urinary frequency, nocturnal urination.  Endo: Negative for unusual weight change.    Physical Examination:   BP 132/76   Pulse 73   Temp 97.8 F (36.6 C) (Oral)   Ht 5\' 5"  (1.651 m)   Wt 284 lb 9.6 oz (129.1 kg)   BMI 47.36 kg/m   General: Well-nourished, well-developed in no acute distress.  Eyes: No icterus. Mouth: Oropharyngeal mucosa moist and pink , no lesions erythema or exudate. Lungs: Clear to auscultation bilaterally.  Heart: Regular rate and rhythm, no murmurs rubs or gallops.  Abdomen: Bowel sounds are normal, nontender, nondistended, no hepatosplenomegaly or masses, no abdominal bruits or hernia , no rebound or guarding.   Extremities: No lower extremity edema. No clubbing or deformities. Neuro: Alert and oriented x 4   Skin: Warm and dry, no jaundice.   Psych: Alert and cooperative, normal mood and affect.  Labs:  Lab Results  Component Value Date   CREATININE 0.63 01/27/2018   BUN 16 01/27/2018   NA 137 01/27/2018   K 3.9 01/27/2018   CL 100 01/27/2018   CO2 29 01/27/2018   Lab Results  Component Value Date   ALT 23 01/27/2018   AST 25 01/27/2018   ALKPHOS 96 01/27/2018   BILITOT 0.9 01/27/2018   Lab Results  Component Value Date   WBC 11.1 (H) 01/27/2018   HGB 14.2 01/27/2018   HCT 46.6 (H) 01/27/2018   MCV 88.8 01/27/2018   PLT 279 01/27/2018   Lab Results  Component Value Date   LIPASE 32 01/27/2018    Imaging Studies: Mr Abdomen W Wo Contrast  Result Date: 02/18/2018 CLINICAL DATA:  Pancreatic lesion versus splenic artery aneurysm on CT, for further characterization. EXAM: MRI ABDOMEN WITHOUT AND WITH CONTRAST TECHNIQUE: Multiplanar multisequence MR imaging of the abdomen was performed both before and after the administration of intravenous  contrast. CONTRAST:  10 cc Gadavist COMPARISON:  01/04/2018 FINDINGS: Lower chest: Mild cardiomegaly. Mildly elevated right hemidiaphragm. Hepatobiliary: Unremarkable. No biliary dilatation or focal hepatic parenchymal lesion. The gallbladder appears unremarkable. Pancreas: Unremarkable. The area of concern on prior CT is shown to represent a splenic artery aneurysm and not a pancreatic lesion. Spleen:  Unremarkable Adrenals/Urinary Tract:  Unremarkable Stomach/Bowel: Scattered descending colon diverticula. Vascular/Lymphatic: The lesion of concern adjacent to the pancreatic tail measures 1.2 cm in diameter, appears to arise from the splenic artery, and enhances identical to the splenic artery on all post-contrast phases. Lesion is rim calcified on prior CT, and represents a splenic artery aneurysm. Abdominal aortic atherosclerotic calcification is present. No pathologic adenopathy. Other:  No supplemental non-categorized findings. Musculoskeletal: On today's exam we partially image the right paracentral periumbilical hernia. IMPRESSION: 1. The lesion of concern on CT represents a 1.2 cm in diameter splenic artery aneurysm. Follow up CT abdomen or CT angiography  of the abdomen is recommended in 1 years time in order to assess stability. This recommendation follows ACR consensus guidelines: White Paper of the ACR Incidental Findings Committee II on Vascular Findings. J Am Coll Radiol [phone]. 2. No abnormality of the pancreas is identified. 3. Scattered sigmoid colon diverticula. 4.  Aortic Atherosclerosis (ICD10-I70.0). 5. Small right paracentral hernia, better shown on CT. 6. Mildly elevated right hemidiaphragm. 7. Mild cardiomegaly. Electronically Signed   By: Van Clines M.D.   On: 02/18/2018 11:57   Mr 3d Recon At Scanner  Result Date: 02/18/2018 CLINICAL DATA:  Pancreatic lesion versus splenic artery aneurysm on CT, for further characterization. EXAM: MRI ABDOMEN WITHOUT AND WITH CONTRAST  TECHNIQUE: Multiplanar multisequence MR imaging of the abdomen was performed both before and after the administration of intravenous contrast. CONTRAST:  10 cc Gadavist COMPARISON:  01/04/2018 FINDINGS: Lower chest: Mild cardiomegaly. Mildly elevated right hemidiaphragm. Hepatobiliary: Unremarkable. No biliary dilatation or focal hepatic parenchymal lesion. The gallbladder appears unremarkable. Pancreas: Unremarkable. The area of concern on prior CT is shown to represent a splenic artery aneurysm and not a pancreatic lesion. Spleen:  Unremarkable Adrenals/Urinary Tract:  Unremarkable Stomach/Bowel: Scattered descending colon diverticula. Vascular/Lymphatic: The lesion of concern adjacent to the pancreatic tail measures 1.2 cm in diameter, appears to arise from the splenic artery, and enhances identical to the splenic artery on all post-contrast phases. Lesion is rim calcified on prior CT, and represents a splenic artery aneurysm. Abdominal aortic atherosclerotic calcification is present. No pathologic adenopathy. Other:  No supplemental non-categorized findings. Musculoskeletal: On today's exam we partially image the right paracentral periumbilical hernia. IMPRESSION: 1. The lesion of concern on CT represents a 1.2 cm in diameter splenic artery aneurysm. Follow up CT abdomen or CT angiography of the abdomen is recommended in 1 years time in order to assess stability. This recommendation follows ACR consensus guidelines: White Paper of the ACR Incidental Findings Committee II on Vascular Findings. J Am Coll Radiol [phone]. 2. No abnormality of the pancreas is identified. 3. Scattered sigmoid colon diverticula. 4.  Aortic Atherosclerosis (ICD10-I70.0). 5. Small right paracentral hernia, better shown on CT. 6. Mildly elevated right hemidiaphragm. 7. Mild cardiomegaly. Electronically Signed   By: Van Clines M.D.   On: 02/18/2018 11:57   Dg Acute Abd 2+v Abd (supine,erect,decub) + 1v Chest  Result  Date: 01/27/2018 CLINICAL DATA:  Lower and mid abdominal pain. EXAM: DG ABDOMEN ACUTE W/ 1V CHEST COMPARISON:  Chest x-ray 01/04/2018. FINDINGS: The lungs are clear without focal pneumonia, edema, pneumothorax or pleural effusion. Interstitial markings are diffusely coarsened with chronic features. Linear scarring peripheral left lower lung is stable. The cardiopericardial silhouette is within normal limits for size. The visualized bony structures of the thorax are intact. Insert no free air supine film shows mild gaseous distention of small bowel loops in the left abdomen up to 3.3 cm diameter with stair stepping air-fluid level seen in the right abdomen on upright film. Bony anatomy is diffusely demineralized. IMPRESSION: No acute cardiopulmonary findings. No intraperitoneal free air. Gaseous distention of small bowel with scattered air-fluid levels on the upright film. Bowel gas pattern is not overtly obstructive and ileus/gastroenteritis would be a consideration. If clinical picture is equivocal, CT imaging may prove helpful to further evaluate. Electronically Signed   By: Misty Stanley M.D.   On: 01/27/2018 19:24   Mm 3d Screen Breast Bilateral  Result Date: 02/07/2018 CLINICAL DATA:  Screening. EXAM: DIGITAL SCREENING BILATERAL MAMMOGRAM WITH TOMO AND CAD COMPARISON:  Previous exam(s).  ACR Breast Density Category b: There are scattered areas of fibroglandular density. FINDINGS: There are no findings suspicious for malignancy. Images were processed with CAD. IMPRESSION: No mammographic evidence of malignancy. A result letter of this screening mammogram will be mailed directly to the patient. RECOMMENDATION: Screening mammogram in one year. (Code:[id number]) BI-RADS CATEGORY  1: Negative. Electronically Signed   By: Lajean Manes M.D.   On: 02/07/2018 10:21

## 2018-02-25 NOTE — Patient Instructions (Signed)
RX for Zofran sent to your pharmacy.  Consume low fiber meals.  I will discuss your case with Dr. Gala Romney and we will let you know any further recommendations.    Low-Fiber Eating Plan Fiber is found in fruits, vegetables, whole grains, and beans. Eating a diet low in fiber helps to reduce how often you have bowel movements and how much you produce during a bowel movement. A low-fiber eating plan may help your digestive system heal if:  You have certain conditions, such as Crohn's disease or diverticulitis.  You recently had radiation therapy on your pelvis or bowel.  You recently had intestinal surgery.  You have a new surgical opening in your abdomen (colostomy or ileostomy).  Your intestine is narrowed (stricture). Your health care provider will determine how long you need to stay on this diet. Your health care provider may recommend that you work with a diet and nutrition specialist (dietitian). What are tips for following this plan? General guidelines  Follow recommendations from your dietitian about how much fiber you should have each day.  Most people on this eating plan should try to eat less than 10 grams (g) of fiber each day. Your daily fiber goal is _________________ g.  Take vitamin and mineral supplements as told by your health care provider or dietitian. Chewable or liquid forms are best when on this eating plan. Reading food labels  Check food labels for the amount of dietary fiber.  Choose foods that have less than 2 grams of fiber in one serving. Cooking  Use white flour and other allowed grains for baking and cooking.  Cook meat using methods that keep it tender, such as braising or poaching.  Cook eggs until the yolk is completely solid.  Cook with healthy oils, such as olive oil or canola oil. Meal planning   Eat 5-6 small meals throughout the day instead of 3 large meals.  If you are lactose intolerant: ? Choose low-lactose dairy foods. ? Do not eat  dairy foods, if told by your dietitian.  Limit fat and oils to less than 8 teaspoons a day.  Eat small portions of desserts. What foods are allowed? The items listed below may not be a complete list. Talk with your dietitian about what dietary choices are best for you. Grains All bread and crackers made with white flour. Waffles, pancakes, and [place] toast. Bagels. Pretzels. Melba toast, zwieback, and matzoh. Cooked and dried cereals that do not contain whole grains, added fiber, seeds, or dried fruit. CornmealDomenick Gong. Hot and cold cereals made with refined corn, wheat, rice, or oats. Plain pasta and noodles. White rice. Vegetables Well-cooked or canned vegetables without skin, seeds, or stems. Cooked potatoes without skins. Vegetable juice. Fruits Soft-cooked or canned fruits without skin and seeds. Peeled ripe banana. Applesauce. Fruit juice without pulp. Meats and other protein foods Ground meat. Tender cuts of meat or poultry. Eggs. Fish, seafood, and shellfish. Smooth nut butters. Tofu. Dairy All milk products and drinks. Lactose-free milks, including rice, soy, and almond milks. Yogurt without fruit, nuts, chocolate, or granola mix-ins. Sour cream. Cottage cheese. Cheese. Beverages Decaf coffee. Fruit and vegetable juices or smoothies (in small amounts, with no pulp or skins, and with fruits from allowed list). Sports drinks. Herbal tea. Fats and oils Olive oil, canola oil, sunflower oil, flaxseed oil, and grapeseed oil. Mayonnaise. Cream cheese. Margarine. Butter. Sweets and desserts Plain cakes and cookies. Cream pies and pies made with allowed fruits. Pudding. Custard. Fruit gelatin. Sherbet. Popsicles.  Ice cream without nuts. Plain hard candy. Honey. Jelly. Molasses. Syrups, including chocolate syrup. Chocolate. Marshmallows. Gumdrops. Seasoning and other foods Bouillon. Broth. Cream soups made from allowed foods. Strained soup. Casseroles made with allowed foods. Ketchup. Mild  mustard. Mild salad dressings. Plain gravies. Vinegar. Spices in moderation. Salt. Sugar. What foods are not allowed? The items listed below may not be a complete list. Talk with your dietitian about what dietary choices are best for you. Grains Whole wheat and whole grain breads and crackers. Multigrain breads and crackers. Rye bread. Whole grain or multigrain cereals. Cereals with nuts, raisins, or coconut. Bran. Coarse wheat cereals. Granola. High-fiber cereals. Cornmeal or corn bread. Whole grain pasta. Wild or brown rice. Quinoa. Popcorn. Buckwheat. Wheat germ. Vegetables Potato skins. Raw or undercooked vegetables. All beans and bean sprouts. Cooked greens. Corn. Peas. Cabbage. Beets. Broccoli. Brussels sprouts. Cauliflower. Mushrooms. Onions. Peppers. Parsnips. Okra. Sauerkraut. Fruit Raw or dried fruit. Berries. Fruit juice with pulp. Prune juice. Meats and other protein foods Tough, fibrous meats with gristle. Fatty meat. Poultry with skin. Fried meat, Sales executive, or fish. Deli or lunch meats. Sausage, bacon, and hot dogs. Nuts and chunky nut butter. Dried peas, beans, and lentils. Dairy Yogurt with fruit, nuts, chocolate, or granola mix-ins. Beverages Caffeinated coffee and teas. Fats and oils Avocado. Coconut. Sweets and desserts Desserts, cookies, or candies that contain nuts or coconut. Dried fruit. Jams and preserves with seeds. Marmalade. Any dessert made with fruits or grains that are not allowed. Seasoning and other foods Corn tortilla chips. Soups made with vegetables or grains that are not allowed. Relish. Horseradish. Annette Clark. Olives. Summary  Most people on a low-fiber eating plan should eat less than 10 grams of fiber a day. Follow recommendations from your dietitian about how much fiber you should have each day.  Always check food labels to see the dietary fiber content of packaged foods. In general, a low-fiber food will have fewer than 2 grams of fiber per  serving.  In general, try to avoid whole grains, raw fruits and vegetables, dried fruit, tough cuts of meat, nuts, and seeds.  Take a vitamin and mineral supplement as told by your health care provider or dietitian. This information is not intended to replace advice given to you by your health care provider. Make sure you discuss any questions you have with your health care provider. Document Released: 06/27/2001 Document Revised: 03/10/2016 Document Reviewed: 03/10/2016 Elsevier Interactive Patient Education  2019 [employer].

## 2018-02-28 NOTE — Assessment & Plan Note (Addendum)
Very pleasant 68 year old female with remote history of ovarian cancer status post resection, chemotherapy, part of her colon versus small bowel removed at that time, who is developed recurrent partial small bowel obstructions as outlined above.  No recommendations for surgical intervention at this time but it was felt that likely her disease is related to adhesions.  General surgery suggested low fiber diet.  I will touch base with Dr. Gala Romney regarding any other potential recommendations. There is likely not much else that can be offered from GI standpoint to prevent SBO. Provided her with Zofran to have on hand if needed for future episodes. Advised that for significant pain or inability to stay hydrated she should always see medical attention.

## 2018-02-28 NOTE — Progress Notes (Signed)
ON RECALL  °

## 2018-03-01 ENCOUNTER — Telehealth: Payer: Self-pay | Admitting: Gastroenterology

## 2018-03-01 ENCOUNTER — Encounter: Payer: Self-pay | Admitting: Internal Medicine

## 2018-03-01 DIAGNOSIS — K56609 Unspecified intestinal obstruction, unspecified as to partial versus complete obstruction: Secondary | ICD-10-CM

## 2018-03-01 NOTE — Telephone Encounter (Signed)
Spoke with pt and she is in agreement to have the CTE. Please schedule 3 month apt with RMR

## 2018-03-01 NOTE — Progress Notes (Signed)
CC'D TO PCP °

## 2018-03-01 NOTE — Telephone Encounter (Signed)
PATIENT SCHEDULED AND LETTER SENT  °

## 2018-03-01 NOTE — Telephone Encounter (Signed)
CTE scheduled for 03/02/2018 at 11:00am, arrival time 9:30am, npo 4 hrs prior.  Called patient and she did not think this would work. She has been provided with central scheduling # to get r/s'd.

## 2018-03-01 NOTE — Addendum Note (Signed)
Addended by: Inge Rise on: 03/01/2018 10:08 AM   Modules accepted: Orders

## 2018-03-01 NOTE — Telephone Encounter (Signed)
Please let patient know that I discussed case with Dr. Gala Romney regarding ongoing partial small bowel obstructions, recent surgery evaluation/recommendations.   He suggests considering CTE to really get a good look at her small bowel to see if there is a stricture or tumor that is causing these recurrent obstructions. If agreeable, please schedule.   She needs to continue low fiber diet. F/u with RMR only in 3 months.

## 2018-03-02 ENCOUNTER — Ambulatory Visit (HOSPITAL_COMMUNITY): Payer: Medicare Other

## 2018-03-15 ENCOUNTER — Ambulatory Visit (HOSPITAL_COMMUNITY)
Admission: RE | Admit: 2018-03-15 | Discharge: 2018-03-15 | Disposition: A | Discharge status: Home / Self Care | Payer: Medicare Other | Source: Ambulatory Visit | Attending: Gastroenterology | Admitting: Gastroenterology

## 2018-03-15 DIAGNOSIS — K56609 Unspecified intestinal obstruction, unspecified as to partial versus complete obstruction: Secondary | ICD-10-CM | POA: Diagnosis not present

## 2018-03-15 LAB — POCT I-STAT CREATININE: Creatinine, Ser: 0.6 mg/dL (ref 0.44–1.00)

## 2018-03-15 MED ORDER — IOPAMIDOL (ISOVUE-300) INJECTION 61%
150.0000 mL | Freq: Once | INTRAVENOUS | Status: AC | PRN
  Administered 2018-03-15: 125 mL via INTRAVENOUS

## 2018-05-17 ENCOUNTER — Encounter: Payer: Self-pay | Admitting: Internal Medicine

## 2018-05-17 ENCOUNTER — Ambulatory Visit (INDEPENDENT_AMBULATORY_CARE_PROVIDER_SITE_OTHER): Payer: Medicare Other | Admitting: Internal Medicine

## 2018-05-17 ENCOUNTER — Other Ambulatory Visit: Payer: Self-pay

## 2018-05-17 DIAGNOSIS — K56609 Unspecified intestinal obstruction, unspecified as to partial versus complete obstruction: Secondary | ICD-10-CM | POA: Diagnosis not present

## 2018-05-17 DIAGNOSIS — K436 Other and unspecified ventral hernia with obstruction, without gangrene: Secondary | ICD-10-CM

## 2018-05-17 NOTE — Patient Instructions (Addendum)
  As discussed, patient may liberalize her diet a bit.  She can certainly have a tomato sandwich today as requested.  Occasional modest salad and essential servings of other vegetables daily are okay at this point in time.  There is no way to guarantee 100% prevention of a future SBO regardless of her diet.    Colace 100 mg twice daily to soften stool  Use Zofran as needed.  MRI pancreas January 2021  Office visit with [place] in December 2020  Annette Clark is to call if she has any interim problems.
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
## 2018-06-15 DIAGNOSIS — L729 Follicular cyst of the skin and subcutaneous tissue, unspecified: Secondary | ICD-10-CM | POA: Diagnosis not present

## 2018-06-15 DIAGNOSIS — Z6841 Body Mass Index (BMI) 40.0 and over, adult: Secondary | ICD-10-CM | POA: Diagnosis not present

## 2018-06-15 DIAGNOSIS — Z1389 Encounter for screening for other disorder: Secondary | ICD-10-CM | POA: Diagnosis not present

## 2018-06-15 DIAGNOSIS — A4902 Methicillin resistant Staphylococcus aureus infection, unspecified site: Secondary | ICD-10-CM | POA: Diagnosis not present

## 2018-06-16 ENCOUNTER — Emergency Department (HOSPITAL_COMMUNITY): Payer: Medicare Other

## 2018-06-16 ENCOUNTER — Emergency Department (HOSPITAL_COMMUNITY)
Admission: EM | Admit: 2018-06-16 | Discharge: 2018-06-17 | Disposition: A | Discharge status: Home / Self Care | Payer: Medicare Other | Attending: Emergency Medicine | Admitting: Emergency Medicine

## 2018-06-16 ENCOUNTER — Encounter (HOSPITAL_COMMUNITY): Payer: Self-pay | Admitting: Emergency Medicine

## 2018-06-16 ENCOUNTER — Other Ambulatory Visit: Payer: Self-pay

## 2018-06-16 DIAGNOSIS — K5733 Diverticulitis of large intestine without perforation or abscess with bleeding: Secondary | ICD-10-CM | POA: Insufficient documentation

## 2018-06-16 DIAGNOSIS — R110 Nausea: Secondary | ICD-10-CM | POA: Insufficient documentation

## 2018-06-16 DIAGNOSIS — K429 Umbilical hernia without obstruction or gangrene: Secondary | ICD-10-CM | POA: Diagnosis not present

## 2018-06-16 DIAGNOSIS — R002 Palpitations: Secondary | ICD-10-CM | POA: Insufficient documentation

## 2018-06-16 DIAGNOSIS — K625 Hemorrhage of anus and rectum: Secondary | ICD-10-CM | POA: Diagnosis present

## 2018-06-16 DIAGNOSIS — Z87891 Personal history of nicotine dependence: Secondary | ICD-10-CM | POA: Insufficient documentation

## 2018-06-16 DIAGNOSIS — K921 Melena: Secondary | ICD-10-CM | POA: Diagnosis not present

## 2018-06-16 DIAGNOSIS — R42 Dizziness and giddiness: Secondary | ICD-10-CM | POA: Insufficient documentation

## 2018-06-16 DIAGNOSIS — Z79899 Other long term (current) drug therapy: Secondary | ICD-10-CM | POA: Insufficient documentation

## 2018-06-16 DIAGNOSIS — E039 Hypothyroidism, unspecified: Secondary | ICD-10-CM | POA: Insufficient documentation

## 2018-06-16 DIAGNOSIS — K439 Ventral hernia without obstruction or gangrene: Secondary | ICD-10-CM | POA: Diagnosis not present

## 2018-06-16 DIAGNOSIS — I10 Essential (primary) hypertension: Secondary | ICD-10-CM | POA: Diagnosis not present

## 2018-06-16 LAB — COMPREHENSIVE METABOLIC PANEL
ALT: 19 U/L (ref 0–44)
AST: 20 U/L (ref 15–41)
Albumin: 3.6 g/dL (ref 3.5–5.0)
Alkaline Phosphatase: 81 U/L (ref 38–126)
Anion gap: 9 (ref 5–15)
BUN: 15 mg/dL (ref 8–23)
CO2: 29 mmol/L (ref 22–32)
Calcium: 8.9 mg/dL (ref 8.9–10.3)
Chloride: 100 mmol/L (ref 98–111)
Creatinine, Ser: 0.87 mg/dL (ref 0.44–1.00)
GFR calc Af Amer: >60 mL/min (ref >60)
GFR calc non Af Amer: >60 mL/min (ref >60)
Glucose, Bld: 118 mg/dL — ABNORMAL HIGH (ref 70–99)
Potassium: 4.7 mmol/L (ref 3.5–5.1)
Sodium: 138 mmol/L (ref 135–145)
Total Bilirubin: 0.6 mg/dL (ref 0.3–1.2)
Total Protein: 6.9 g/dL (ref 6.5–8.1)

## 2018-06-16 LAB — CBC WITH DIFFERENTIAL/PLATELET
Abs Immature Granulocytes: 0.01 10*3/uL (ref 0.00–0.07)
Basophils Absolute: 0 10*3/uL (ref 0.0–0.1)
Basophils Relative: 1 %
Eosinophils Absolute: 0.1 10*3/uL (ref 0.0–0.5)
Eosinophils Relative: 1 %
HCT: 40.9 % (ref 36.0–46.0)
Hemoglobin: 12.6 g/dL (ref 12.0–15.0)
Immature Granulocytes: 0 %
Lymphocytes Relative: 22 %
Lymphs Abs: 1.8 10*3/uL (ref 0.7–4.0)
MCH: 27 pg (ref 26.0–34.0)
MCHC: 30.8 g/dL (ref 30.0–36.0)
MCV: 87.6 fL (ref 80.0–100.0)
Monocytes Absolute: 0.9 10*3/uL (ref 0.1–1.0)
Monocytes Relative: 11 %
Neutro Abs: 5.4 10*3/uL (ref 1.7–7.7)
Neutrophils Relative %: 65 %
Platelets: 221 10*3/uL (ref 150–400)
RBC: 4.67 MIL/uL (ref 3.87–5.11)
RDW: 14 % (ref 11.5–15.5)
WBC: 8.2 10*3/uL (ref 4.0–10.5)
nRBC: 0 % (ref 0.0–0.2)

## 2018-06-16 LAB — PROTIME-INR
INR: 1 (ref 0.8–1.2)
Prothrombin Time: 12.7 seconds (ref 11.4–15.2)

## 2018-06-16 MED ORDER — SODIUM CHLORIDE 0.9 % IV BOLUS
500.0000 mL | Freq: Once | INTRAVENOUS | Status: AC
  Administered 2018-06-16: 500 mL via INTRAVENOUS

## 2018-06-16 MED ORDER — IOHEXOL 300 MG/ML  SOLN
100.0000 mL | Freq: Once | INTRAMUSCULAR | Status: AC | PRN
  Administered 2018-06-17: 100 mL via INTRAVENOUS

## 2018-06-16 NOTE — ED Triage Notes (Signed)
Patient states she has had blood and clots in her stool that began today. Patient states that she was constipated yesterday. History of previous bowel obstruction.

## 2018-06-16 NOTE — ED Notes (Signed)
Pt aware of need for urine specimen. 
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
## 2018-06-17 DIAGNOSIS — K5733 Diverticulitis of large intestine without perforation or abscess with bleeding: Secondary | ICD-10-CM | POA: Diagnosis not present

## 2018-06-17 DIAGNOSIS — K439 Ventral hernia without obstruction or gangrene: Secondary | ICD-10-CM | POA: Diagnosis not present

## 2018-06-17 DIAGNOSIS — K921 Melena: Secondary | ICD-10-CM | POA: Diagnosis not present

## 2018-06-17 DIAGNOSIS — K429 Umbilical hernia without obstruction or gangrene: Secondary | ICD-10-CM | POA: Diagnosis not present

## 2018-06-17 LAB — TYPE AND SCREEN
ABO/RH(D): O POS
Antibody Screen: NEGATIVE

## 2018-06-17 MED ORDER — CIPROFLOXACIN HCL 500 MG PO TABS: 500.0000 mg | ORAL_TABLET | Freq: Two times a day (BID) | ORAL | 0 refills | Status: DC

## 2018-06-17 MED ORDER — METRONIDAZOLE 500 MG PO TABS: 500.0000 mg | ORAL_TABLET | Freq: Three times a day (TID) | ORAL | 0 refills | Status: DC

## 2018-06-17 MED ORDER — CIPROFLOXACIN HCL 250 MG PO TABS
500.0000 mg | ORAL_TABLET | Freq: Once | ORAL | Status: AC
  Administered 2018-06-17: 02:00:00 500 mg via ORAL
  Filled 2018-06-17: qty 2

## 2018-06-17 MED ORDER — METRONIDAZOLE 500 MG PO TABS
500.0000 mg | ORAL_TABLET | Freq: Once | ORAL | Status: AC
  Administered 2018-06-17: 02:00:00 500 mg via ORAL
  Filled 2018-06-17: qty 1

## 2018-06-20 ENCOUNTER — Telehealth: Payer: Self-pay | Admitting: Internal Medicine

## 2018-06-20 NOTE — Telephone Encounter (Signed)
Spoke with Annette Clark. She went to the ED last Thursday and after her CT scan, she was told she had diverticulitis. Annette Clark is currently taking Cipro 500 mg. Annette Clark has c/o constant nausea and loose stool throughout the day. Annette Clark is afraid that she can't finish the Cipro 500 mg 2 tabs bid #20.

## 2018-06-20 NOTE — Telephone Encounter (Signed)
[place] EFFECT

## 2018-06-21 NOTE — Telephone Encounter (Signed)
She is experiencing side effects of antibiotics.  I reviewed the ED note.  She supposed to be on Cipro and Flagyl.  Will probably subside when she completes her antibiotics.

## 2018-06-21 NOTE — Telephone Encounter (Signed)
Spoke with pt and she spoke with one of her doctors and was told she could d/c the medication. Pt feels a lot better and will call back if needed.

## 2018-07-06 ENCOUNTER — Ambulatory Visit (INDEPENDENT_AMBULATORY_CARE_PROVIDER_SITE_OTHER): Payer: Medicare Other

## 2018-07-06 ENCOUNTER — Encounter: Payer: Self-pay | Admitting: Orthopedic Surgery

## 2018-07-06 ENCOUNTER — Ambulatory Visit (INDEPENDENT_AMBULATORY_CARE_PROVIDER_SITE_OTHER): Payer: Medicare Other | Admitting: Orthopedic Surgery

## 2018-07-06 ENCOUNTER — Other Ambulatory Visit: Payer: Self-pay

## 2018-07-06 VITALS — BP 112/71 | HR 96 | Temp 97.3°F | Ht 64.0 in | Wt 285.0 lb

## 2018-07-06 DIAGNOSIS — M25562 Pain in left knee: Secondary | ICD-10-CM

## 2018-07-06 DIAGNOSIS — Z6841 Body Mass Index (BMI) 40.0 and over, adult: Secondary | ICD-10-CM | POA: Diagnosis not present
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
## 2018-07-06 NOTE — Patient Instructions (Signed)

## 2018-07-27 ENCOUNTER — Ambulatory Visit (INDEPENDENT_AMBULATORY_CARE_PROVIDER_SITE_OTHER): Payer: Medicare Other | Admitting: Orthopedic Surgery

## 2018-07-27 ENCOUNTER — Other Ambulatory Visit: Payer: Self-pay

## 2018-07-27 VITALS — BP 138/83 | HR 75 | Temp 97.2°F | Ht 64.0 in | Wt 280.0 lb

## 2018-07-27 DIAGNOSIS — M25562 Pain in left knee: Secondary | ICD-10-CM | POA: Diagnosis not present

## 2018-07-27 NOTE — Progress Notes (Signed)
Progress Note   Patient ID: Annette Clark, female   DOB: Jun 08, 1950, 68 y.o.   MRN: [id number]   Chief Complaint  Patient presents with  . Follow-up    Recheck on left knee pain.    Encounter Diagnosis  Name Primary?  . Acute pain of left knee Yes    No Known Allergies   Current Outpatient Medications:  .  acetaminophen (TYLENOL) 325 MG tablet, Take 650 mg by mouth every 6 (six) hours as needed for mild pain or moderate pain. , Disp: , Rfl:  .  aspirin EC 81 MG tablet, Take 81 mg by mouth daily. , Disp: , Rfl:  .  cholecalciferol (VITAMIN D3) 25 MCG (1000 UT) tablet, Take 1,000 Units by mouth daily., Disp: , Rfl:  .  halobetasol (ULTRAVATE) 0.05 % cream, Apply 1 application topically daily as needed (for irritation). , Disp: , Rfl:  .  levothyroxine (SYNTHROID, LEVOTHROID) 25 MCG tablet, Take 25 mcg by mouth daily before breakfast. , Disp: , Rfl: 0 .  lisinopril (PRINIVIL,ZESTRIL) 20 MG tablet, Take 20 mg by mouth daily. , Disp: , Rfl: 1 .  ondansetron (ZOFRAN ODT) 4 MG disintegrating tablet, Take 1 tablet (4 mg total) by mouth every 6 (six) hours as needed for nausea., Disp: 30 tablet, Rfl: 1 .  Vitamin D, Ergocalciferol, (DRISDOL) 1.25 MG (50000 UT) CAPS capsule, Take 50,000 Units by mouth every 7 (seven) days. Mondays, Disp: , Rfl:  .  zinc gluconate 50 MG tablet, Take 50 mg by mouth daily., Disp: , Rfl:  .  ciprofloxacin (CIPRO) 500 MG tablet, Take 1 tablet (500 mg total) by mouth 2 (two) times daily. (Patient not taking: Reported on 07/06/2018), Disp: 20 tablet, Rfl: 0 .  metroNIDAZOLE (FLAGYL) 500 MG tablet, Take 1 tablet (500 mg total) by mouth 3 (three) times daily. (Patient not taking: Reported on 07/06/2018), Disp: 30 tablet, Rfl: 59   68 year old female presents with acute nonradiating moderately severe dull aching left knee pain after stepping up into her husband's truck about 1 week ago.  Initially she had severe pain on the lateral side of the knee which subsequently  resolved and is now localized to the lateral joint line with catching locking giving sensation     Review of Systems  Constitutional: Negative for fever.  Musculoskeletal: Negative for myalgias.  Skin: Negative.    ROS Neurological: Negative for tingling and sensory change. Differential diagnosis strain medial ligament complex, torn medial meniscus, torn lateral meniscus, exacerbation of underlying arthritis Injection was performed left knee joint lateral approach. Procedure note left knee injection     ROS    BP 138/83   Pulse 75   Temp (!) 97.2 F (36.2 C)   Ht 5\' 4"  (1.626 m)   Wt 280 lb (127 kg)   BMI 48.06 kg/m   Physical Exam Constitutional:      Appearance: Normal appearance.  Musculoskeletal:     Left knee: She exhibits normal range of motion, no swelling and no effusion. No tenderness found.  Neurological:     Mental Status: She is alert and oriented to person, place, and time.      Medical decisions:  (Established problem worse, x-ray ,physical therapy, over-the-counter medicines, read outside film or summarize x-ray)  Data  Encounter Diagnosis  Name Primary?  . Acute pain of left knee Yes    PLAN:   Resume normal activities follow-up as needed    Arther Abbott, MD 07/27/2018 9:34 AM

## 2018-08-05 ENCOUNTER — Telehealth: Payer: Self-pay | Admitting: Internal Medicine

## 2018-08-05 NOTE — Telephone Encounter (Signed)
Pt needs to speak with the nurse. Please call [phone]

## 2018-08-05 NOTE — Telephone Encounter (Signed)
Spoke with pt. On Tuesday take started with abdominal pain around 12PM and vomiting started around 10 PM. Wednesday the abdominal pain and vomiting stopped. Pt didn't eat but drank liquids since Tuesday. Pt also had Miralax Thursday and Today. Pt had a bowel movement but it was very small, straining was involved to get it out. Pt has mild nausea but not enough to take a pill. Pt is aware that our office closes at 12:00PM and if her symptoms worsen, she will go to the ED.

## 2018-08-05 NOTE — Telephone Encounter (Signed)
Spoke to patient.  Symptoms she was feeling Tuesday night felt like a bowel obstruction type pain.  That discomfort has resolved.  She has some vague nausea, no longer vomiting.  Denies distention or hard abdomen.  At this point she feels like she needs to have a BM but not getting good results.  Prior to episode generally has 2-3 stools every morning.  No fever.   Take MiraLAX 1 capful 2-3 times daily over the next 2 to 3 days.  She may use an enema if needed.    ED precautions provided.  Call on Monday with a progress report.

## 2018-08-08 NOTE — Telephone Encounter (Signed)
Noted. No further recommendations.  

## 2018-08-08 NOTE — Telephone Encounter (Signed)
Spoke with pt. She was able to have a bowel movement on Saturday morning and feels that everything is back on track. Pt states that she is feeling much better.

## 2018-10-25 ENCOUNTER — Other Ambulatory Visit: Payer: Self-pay

## 2018-10-25 DIAGNOSIS — Z20822 Contact with and (suspected) exposure to covid-19: Secondary | ICD-10-CM

## 2018-10-25 DIAGNOSIS — Z20828 Contact with and (suspected) exposure to other viral communicable diseases: Secondary | ICD-10-CM | POA: Diagnosis not present

## 2018-10-26 ENCOUNTER — Ambulatory Visit: Payer: Medicare Other | Admitting: Orthopedic Surgery

## 2018-10-28 LAB — NOVEL CORONAVIRUS, NAA: SARS-CoV-2, NAA: NOT DETECTED

## 2018-11-12 DIAGNOSIS — Z23 Encounter for immunization: Secondary | ICD-10-CM | POA: Diagnosis not present

## 2018-12-19 DIAGNOSIS — E785 Hyperlipidemia, unspecified: Secondary | ICD-10-CM | POA: Diagnosis not present

## 2018-12-19 DIAGNOSIS — I10 Essential (primary) hypertension: Secondary | ICD-10-CM | POA: Diagnosis not present

## 2019-01-04 ENCOUNTER — Telehealth: Payer: Self-pay | Admitting: Internal Medicine

## 2019-01-04 NOTE — Telephone Encounter (Signed)
Recall sent 

## 2019-01-04 NOTE — Telephone Encounter (Signed)
RECALL FOR MRI PANCREAS

## 2019-01-17 ENCOUNTER — Encounter: Payer: Self-pay | Admitting: *Deleted

## 2019-01-17 ENCOUNTER — Ambulatory Visit (INDEPENDENT_AMBULATORY_CARE_PROVIDER_SITE_OTHER): Payer: Medicare Other | Admitting: Gastroenterology

## 2019-01-17 ENCOUNTER — Other Ambulatory Visit: Payer: Self-pay

## 2019-01-17 ENCOUNTER — Encounter: Payer: Self-pay | Admitting: Gastroenterology

## 2019-01-17 ENCOUNTER — Other Ambulatory Visit: Payer: Self-pay | Admitting: *Deleted

## 2019-01-17 VITALS — BP 172/91 | HR 63 | Temp 96.9°F | Ht 64.0 in | Wt 282.4 lb

## 2019-01-17 DIAGNOSIS — I728 Aneurysm of other specified arteries: Secondary | ICD-10-CM

## 2019-01-17 DIAGNOSIS — K56609 Unspecified intestinal obstruction, unspecified as to partial versus complete obstruction: Secondary | ICD-10-CM | POA: Diagnosis not present

## 2019-01-17 DIAGNOSIS — Z8719 Personal history of other diseases of the digestive system: Secondary | ICD-10-CM

## 2019-01-17 MED ORDER — PROMETHAZINE HCL 12.5 MG PO TABS: ORAL_TABLET | ORAL | 0 refills | Status: AC

## 2019-01-17 MED ORDER — NA SULFATE-K SULFATE-MG SULF 17.5-3.13-1.6 GM/177ML PO SOLN: 1.0000 | Freq: Once | ORAL | 0 refills | Status: AC
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
## 2019-01-17 NOTE — Patient Instructions (Signed)
We will arrange a CT in May 2021 to assess the known splenic artery aneurysm.  We are arranging a colonoscopy with Dr. Gala Romney in the near future.  IF further obstructive episodes with severe nausea, I have sent in Phenergan to take. Take the lowest dose possible every 8 hours as needed. This can cause drowsiness, dizziness, dry mouth. Do not drive or operate machinery when taking.  Further recommendations to follow!  It was a pleasure to see you today. I want to create trusting relationships with patients to provide genuine, compassionate, and quality care. I value your feedback. If you receive a survey regarding your visit,  I greatly appreciate you taking time to fill this out.   Annitta Needs, PhD, ANP-BC [facility]

## 2019-01-19 NOTE — Progress Notes (Signed)
CC'ED TO PCP 

## 2019-02-19 DIAGNOSIS — E039 Hypothyroidism, unspecified: Secondary | ICD-10-CM | POA: Diagnosis not present

## 2019-02-19 DIAGNOSIS — E785 Hyperlipidemia, unspecified: Secondary | ICD-10-CM | POA: Diagnosis not present

## 2019-02-19 DIAGNOSIS — I10 Essential (primary) hypertension: Secondary | ICD-10-CM | POA: Diagnosis not present

## 2019-02-20 DIAGNOSIS — D2272 Melanocytic nevi of left lower limb, including hip: Secondary | ICD-10-CM | POA: Diagnosis not present

## 2019-02-20 DIAGNOSIS — L820 Inflamed seborrheic keratosis: Secondary | ICD-10-CM | POA: Diagnosis not present

## 2019-02-22 ENCOUNTER — Telehealth: Payer: Self-pay | Admitting: Internal Medicine

## 2019-02-22 NOTE — Telephone Encounter (Signed)
Please change NIC for CT to May 2021 per OV note 01/17/20.

## 2019-02-22 NOTE — Telephone Encounter (Signed)
Recall for ct °

## 2019-03-02 DIAGNOSIS — Z23 Encounter for immunization: Secondary | ICD-10-CM | POA: Diagnosis not present

## 2019-03-06 ENCOUNTER — Ambulatory Visit (INDEPENDENT_AMBULATORY_CARE_PROVIDER_SITE_OTHER): Payer: Medicare Other | Admitting: Orthopedic Surgery

## 2019-03-06 ENCOUNTER — Other Ambulatory Visit: Payer: Self-pay

## 2019-03-06 VITALS — BP 122/61 | HR 72 | Temp 97.2°F | Ht 64.0 in | Wt 271.0 lb

## 2019-03-06 DIAGNOSIS — M25562 Pain in left knee: Secondary | ICD-10-CM | POA: Diagnosis not present

## 2019-03-06 DIAGNOSIS — G8929 Other chronic pain: Secondary | ICD-10-CM | POA: Diagnosis not present

## 2019-03-06 NOTE — Patient Instructions (Signed)

## 2019-03-06 NOTE — Progress Notes (Signed)
Chief Complaint  Patient presents with  . Follow-up    Recheck on left knee.    Requested injection   Procedure note left knee injection   verbal consent was obtained to inject left knee joint  Timeout was completed to confirm the site of injection  The medications used were 40 mg of Depo-Medrol and 1% lidocaine 3 cc  Anesthesia was provided by ethyl chloride and the skin was prepped with alcohol.  After cleaning the skin with alcohol a 20-gauge needle was used to inject the left knee joint. There were no complications. A sterile bandage was applied.  Encounter Diagnosis  Name Primary?  . Chronic pain of left knee Yes   Fu prn

## 2019-03-29 ENCOUNTER — Other Ambulatory Visit (HOSPITAL_COMMUNITY): Payer: Self-pay | Admitting: Family Medicine

## 2019-03-29 DIAGNOSIS — Z1231 Encounter for screening mammogram for malignant neoplasm of breast: Secondary | ICD-10-CM

## 2019-03-30 ENCOUNTER — Other Ambulatory Visit: Payer: Self-pay

## 2019-03-30 ENCOUNTER — Encounter (HOSPITAL_COMMUNITY): Payer: Self-pay

## 2019-03-31 ENCOUNTER — Encounter (HOSPITAL_COMMUNITY)
Admission: RE | Admit: 2019-03-31 | Discharge: 2019-03-31 | Disposition: A | Discharge status: Home / Self Care | Payer: Medicare Other | Source: Ambulatory Visit | Attending: Internal Medicine | Admitting: Internal Medicine

## 2019-03-31 ENCOUNTER — Other Ambulatory Visit (HOSPITAL_COMMUNITY)
Admission: RE | Admit: 2019-03-31 | Discharge: 2019-03-31 | Disposition: A | Discharge status: Home / Self Care | Payer: Medicare Other | Source: Ambulatory Visit | Attending: Internal Medicine | Admitting: Internal Medicine

## 2019-03-31 DIAGNOSIS — Z20822 Contact with and (suspected) exposure to covid-19: Secondary | ICD-10-CM | POA: Insufficient documentation

## 2019-03-31 DIAGNOSIS — Z01812 Encounter for preprocedural laboratory examination: Secondary | ICD-10-CM | POA: Diagnosis not present

## 2019-03-31 DIAGNOSIS — Z23 Encounter for immunization: Secondary | ICD-10-CM | POA: Diagnosis not present

## 2019-03-31 HISTORY — DX: Unspecified osteoarthritis, unspecified site: M19.90

## 2019-04-01 LAB — SARS CORONAVIRUS 2 (TAT 6-24 HRS): SARS Coronavirus 2: NEGATIVE

## 2019-04-03 ENCOUNTER — Ambulatory Visit (HOSPITAL_COMMUNITY)
Admission: RE | Admit: 2019-04-03 | Discharge: 2019-04-03 | Disposition: A | Discharge status: Home / Self Care | Payer: Medicare Other | Attending: Internal Medicine | Admitting: Internal Medicine

## 2019-04-03 ENCOUNTER — Encounter (HOSPITAL_COMMUNITY): Payer: Self-pay | Admitting: Internal Medicine

## 2019-04-03 ENCOUNTER — Ambulatory Visit (HOSPITAL_COMMUNITY): Payer: Medicare Other | Admitting: Anesthesiology

## 2019-04-03 ENCOUNTER — Encounter (HOSPITAL_COMMUNITY)
Admission: RE | Disposition: A | Discharge status: Home / Self Care | Payer: Self-pay | Source: Home / Self Care | Attending: Internal Medicine

## 2019-04-03 DIAGNOSIS — I728 Aneurysm of other specified arteries: Secondary | ICD-10-CM | POA: Diagnosis not present

## 2019-04-03 DIAGNOSIS — K5730 Diverticulosis of large intestine without perforation or abscess without bleeding: Secondary | ICD-10-CM | POA: Diagnosis not present

## 2019-04-03 DIAGNOSIS — E669 Obesity, unspecified: Secondary | ICD-10-CM | POA: Diagnosis not present

## 2019-04-03 DIAGNOSIS — Z87891 Personal history of nicotine dependence: Secondary | ICD-10-CM | POA: Insufficient documentation

## 2019-04-03 DIAGNOSIS — K635 Polyp of colon: Secondary | ICD-10-CM | POA: Diagnosis not present

## 2019-04-03 DIAGNOSIS — Z8543 Personal history of malignant neoplasm of ovary: Secondary | ICD-10-CM | POA: Insufficient documentation

## 2019-04-03 DIAGNOSIS — Z90722 Acquired absence of ovaries, bilateral: Secondary | ICD-10-CM | POA: Diagnosis not present

## 2019-04-03 DIAGNOSIS — E039 Hypothyroidism, unspecified: Secondary | ICD-10-CM | POA: Insufficient documentation

## 2019-04-03 DIAGNOSIS — Z79890 Hormone replacement therapy (postmenopausal): Secondary | ICD-10-CM | POA: Diagnosis not present

## 2019-04-03 DIAGNOSIS — Z86010 Personal history of colonic polyps: Secondary | ICD-10-CM | POA: Diagnosis not present

## 2019-04-03 DIAGNOSIS — M1990 Unspecified osteoarthritis, unspecified site: Secondary | ICD-10-CM | POA: Diagnosis not present

## 2019-04-03 DIAGNOSIS — Z79899 Other long term (current) drug therapy: Secondary | ICD-10-CM | POA: Insufficient documentation

## 2019-04-03 DIAGNOSIS — D120 Benign neoplasm of cecum: Secondary | ICD-10-CM | POA: Diagnosis not present

## 2019-04-03 DIAGNOSIS — Z7982 Long term (current) use of aspirin: Secondary | ICD-10-CM | POA: Diagnosis not present

## 2019-04-03 DIAGNOSIS — D124 Benign neoplasm of descending colon: Secondary | ICD-10-CM | POA: Insufficient documentation

## 2019-04-03 DIAGNOSIS — Z86711 Personal history of pulmonary embolism: Secondary | ICD-10-CM | POA: Diagnosis not present

## 2019-04-03 DIAGNOSIS — I10 Essential (primary) hypertension: Secondary | ICD-10-CM | POA: Insufficient documentation

## 2019-04-03 DIAGNOSIS — R933 Abnormal findings on diagnostic imaging of other parts of digestive tract: Secondary | ICD-10-CM | POA: Diagnosis not present

## 2019-04-03 DIAGNOSIS — Z8719 Personal history of other diseases of the digestive system: Secondary | ICD-10-CM

## 2019-04-03 HISTORY — PX: POLYPECTOMY: SHX5525

## 2019-04-03 HISTORY — PX: COLONOSCOPY WITH PROPOFOL: SHX5780

## 2019-04-03 SURGERY — COLONOSCOPY WITH PROPOFOL
Anesthesia: General

## 2019-04-03 MED ORDER — CHLORHEXIDINE GLUCONATE CLOTH 2 % EX PADS: 6.0000 | MEDICATED_PAD | Freq: Once | CUTANEOUS | Status: DC

## 2019-04-03 MED ORDER — LACTATED RINGERS IV SOLN: Freq: Once | INTRAVENOUS | Status: AC

## 2019-04-03 MED ORDER — PROPOFOL 10 MG/ML IV BOLUS
INTRAVENOUS | Status: DC | PRN
  Administered 2019-04-03: 100 mg via INTRAVENOUS

## 2019-04-03 MED ORDER — FENTANYL CITRATE (PF) 100 MCG/2ML IJ SOLN
INTRAMUSCULAR | Status: AC
  Filled 2019-04-03: qty 2

## 2019-04-03 MED ORDER — FENTANYL CITRATE (PF) 100 MCG/2ML IJ SOLN
INTRAMUSCULAR | Status: DC | PRN
  Administered 2019-04-03 (×2): 25 ug via INTRAVENOUS

## 2019-04-03 MED ORDER — PROPOFOL 10 MG/ML IV BOLUS
INTRAVENOUS | Status: AC
  Filled 2019-04-03: qty 40

## 2019-04-03 MED ORDER — PROPOFOL 500 MG/50ML IV EMUL
INTRAVENOUS | Status: DC | PRN
  Administered 2019-04-03: 100 ug/kg/min via INTRAVENOUS

## 2019-04-03 MED ORDER — LIDOCAINE HCL (CARDIAC) PF 100 MG/5ML IV SOSY
PREFILLED_SYRINGE | INTRAVENOUS | Status: DC | PRN
  Administered 2019-04-03: 40 mg via INTRATRACHEAL

## 2019-04-03 NOTE — Discharge Instructions (Signed)
Colonoscopy Discharge Instructions  Read the instructions outlined below and refer to this sheet in the next few weeks. These discharge instructions provide you with general information on caring for yourself after you leave the hospital. Your doctor may also give you specific instructions. While your treatment has been planned according to the most current medical practices available, unavoidable complications occasionally occur. If you have any problems or questions after discharge, call Dr. Gala Romney at [phone]. ACTIVITY  You may resume your regular activity, but move at a slower pace for the next 24 hours.   Take frequent rest periods for the next 24 hours.   Walking will help get rid of the air and reduce the bloated feeling in your belly (abdomen).   No driving for 24 hours (because of the medicine (anesthesia) used during the test).    Do not sign any important legal documents or operate any machinery for 24 hours (because of the anesthesia used during the test).  NUTRITION  Drink plenty of fluids.   You may resume your normal diet as instructed by your doctor.   Begin with a light meal and progress to your normal diet. Heavy or fried foods are harder to digest and may make you feel sick to your stomach (nauseated).   Avoid alcoholic beverages for 24 hours or as instructed.  MEDICATIONS  You may resume your normal medications unless your doctor tells you otherwise.  WHAT YOU CAN EXPECT TODAY  Some feelings of bloating in the abdomen.   Passage of more gas than usual.   Spotting of blood in your stool or on the toilet paper.  IF YOU HAD POLYPS REMOVED DURING THE COLONOSCOPY:  No aspirin products for 7 days or as instructed.   No alcohol for 7 days or as instructed.   Eat a soft diet for the next 24 hours.  FINDING OUT THE RESULTS OF YOUR TEST Not all test results are available during your visit. If your test results are not back during the visit, make an appointment  with your caregiver to find out the results. Do not assume everything is normal if you have not heard from your caregiver or the medical facility. It is important for you to follow up on all of your test results.  SEEK IMMEDIATE MEDICAL ATTENTION IF:  You have more than a spotting of blood in your stool.   Your belly is swollen (abdominal distention).   You are nauseated or vomiting.   You have a temperature over 101.   You have abdominal pain or discomfort that is severe or gets worse throughout the day.    Colon Polyps  Polyps are tissue growths inside the body. Polyps can grow in many places, including the large intestine (colon). A polyp may be a round bump or a mushroom-shaped growth. You could have one polyp or several. Most colon polyps are noncancerous (benign). However, some colon polyps can become cancerous over time. Finding and removing the polyps early can help prevent this. What are the causes? The exact cause of colon polyps is not known. What increases the risk? You are more likely to develop this condition if you:  Have a family history of colon cancer or colon polyps.  Are older than 67 or older than 45 if you are African American.  Have inflammatory bowel disease, such as ulcerative colitis or Crohn's disease.  Have certain hereditary conditions, such as: ? Familial adenomatous polyposis. ? Lynch syndrome. ? Turcot syndrome. ? Peutz-Jeghers syndrome.  Are  overweight.  Smoke cigarettes.  Do not get enough exercise.  Drink too much alcohol.  Eat a diet that is high in fat and red meat and low in fiber.  Had childhood cancer that was treated with abdominal radiation. What are the signs or symptoms? Most polyps do not cause symptoms. If you have symptoms, they may include:  Blood coming from your rectum when having a bowel movement.  Blood in your stool. The stool may look dark red or black.  Abdominal pain.  A change in bowel habits, such as  constipation or diarrhea. How is this diagnosed? This condition is diagnosed with a colonoscopy. This is a procedure in which a lighted, flexible scope is inserted into the anus and then passed into the colon to examine the area. Polyps are sometimes found when a colonoscopy is done as part of routine cancer screening tests. How is this treated? Treatment for this condition involves removing any polyps that are found. Most polyps can be removed during a colonoscopy. Those polyps will then be tested for cancer. Additional treatment may be needed depending on the results of testing. Follow these instructions at home: Lifestyle  Maintain a healthy weight, or lose weight if recommended by your health care provider.  Exercise every day or as told by your health care provider.  Do not use any products that contain nicotine or tobacco, such as cigarettes and e-cigarettes. If you need help quitting, ask your health care provider.  If you drink alcohol, limit how much you have: ? 0-1 drink a day for women. ? 0-2 drinks a day for men.  Be aware of how much alcohol is in your drink. In the [place] one drink equals one 12 oz bottle of beer (355 mL), one 5 oz glass of wine (148 mL), or one 1 oz shot of hard liquor (44 mL). Eating and drinking   Eat foods that are high in fiber, such as fruits, vegetables, and whole grains.  Eat foods that are high in calcium and vitamin D, such as milk, cheese, yogurt, eggs, liver, fish, and broccoli.  Limit foods that are high in fat, such as fried foods and desserts.  Limit the amount of red meat and processed meat you eat, such as hot dogs, sausage, bacon, and lunch meats. General instructions  Keep all follow-up visits as told by your health care provider. This is important. ? This includes having regularly scheduled colonoscopies. ? Talk to your health care provider about when you need a colonoscopy. Contact a health care provider if:  You have new or  worsening bleeding during a bowel movement.  You have new or increased blood in your stool.  You have a change in bowel habits.  You lose weight for no known reason. Summary  Polyps are tissue growths inside the body. Polyps can grow in many places, including the colon.  Most colon polyps are noncancerous (benign), but some can become cancerous over time.  This condition is diagnosed with a colonoscopy.  Treatment for this condition involves removing any polyps that are found. Most polyps can be removed during a colonoscopy. This information is not intended to replace advice given to you by your health care provider. Make sure you discuss any questions you have with your health care provider. Document Revised: 04/22/2017 Document Reviewed: 04/22/2017 Elsevier Patient Education  [place].  Diverticulosis  Diverticulosis is a condition that develops when small pouches (diverticula) form in the wall of the large intestine (colon). The  colon is where water is absorbed and stool (feces) is formed. The pouches form when the inside layer of the colon pushes through weak spots in the outer layers of the colon. You may have a few pouches or many of them. The pouches usually do not cause problems unless they become inflamed or infected. When this happens, the condition is called diverticulitis. What are the causes? The cause of this condition is not known. What increases the risk? The following factors may make you more likely to develop this condition:  Being older than age 75. Your risk for this condition increases with age. Diverticulosis is rare among people younger than age 19. By age 59, many people have it.  Eating a low-fiber diet.  Having frequent constipation.  Being overweight.  Not getting enough exercise.  Smoking.  Taking over-the-counter pain medicines, like aspirin and ibuprofen.  Having a family history of diverticulosis. What are the signs or  symptoms? In most people, there are no symptoms of this condition. If you do have symptoms, they may include:  Bloating.  Cramps in the abdomen.  Constipation or diarrhea.  Pain in the lower left side of the abdomen. How is this diagnosed? Because diverticulosis usually has no symptoms, it is most often diagnosed during an exam for other colon problems. The condition may be diagnosed by:  Using a flexible scope to examine the colon (colonoscopy).  Taking an X-ray of the colon after dye has been put into the colon (barium enema).  Having a CT scan. How is this treated? You may not need treatment for this condition. Your health care provider may recommend treatment to prevent problems. You may need treatment if you have symptoms or if you previously had diverticulitis. Treatment may include:  Eating a high-fiber diet.  Taking a fiber supplement.  Taking a live bacteria supplement (probiotic).  Taking medicine to relax your colon. Follow these instructions at home: Medicines  Take over-the-counter and prescription medicines only as told by your health care provider.  If told by your health care provider, take a fiber supplement or probiotic. Constipation prevention Your condition may cause constipation. To prevent or treat constipation, you may need to:  Drink enough fluid to keep your urine pale yellow.  Take over-the-counter or prescription medicines.  Eat foods that are high in fiber, such as beans, whole grains, and fresh fruits and vegetables.  Limit foods that are high in fat and processed sugars, such as fried or sweet foods.  General instructions  Try not to strain when you have a bowel movement.  Keep all follow-up visits as told by your health care provider. This is important. Contact a health care provider if you:  Have pain in your abdomen.  Have bloating.  Have cramps.  Have not had a bowel movement in 3 days. Get help right away if:  Your pain  gets worse.  Your bloating becomes very bad.  You have a fever or chills, and your symptoms suddenly get worse.  You vomit.  You have bowel movements that are bloody or black.  You have bleeding from your rectum. Summary  Diverticulosis is a condition that develops when small pouches (diverticula) form in the wall of the large intestine (colon).  You may have a few pouches or many of them.  This condition is most often diagnosed during an exam for other colon problems.  Treatment may include increasing the fiber in your diet, taking supplements, or taking medicines. This information is not intended  to replace advice given to you by your health care provider. Make sure you discuss any questions you have with your health care provider. Document Revised: 08/04/2018 Document Reviewed: 08/04/2018 Elsevier Patient Education  [employer].   Colon diverticulosis and polyp information provided  Further recommendations to follow pending review of pathology report  At patient request I called [facility] [place], [phone] message with results on voicemail  Office visit with [place] in 3 to 4 weeks to evaluate early satiety and weight loss

## 2019-04-03 NOTE — Transfer of Care (Signed)
Immediate Anesthesia Transfer of Care Note  Patient: Annette Clark  Procedure(s) Performed: COLONOSCOPY WITH PROPOFOL (N/A ) POLYPECTOMY  Patient Location: PACU  Anesthesia Type:General  Level of Consciousness: awake, alert  and oriented  Airway & Oxygen Therapy: Patient Spontanous Breathing  Post-op Assessment: Report given to RN, Post -op Vital signs reviewed and stable and Patient moving all extremities X 4  Post vital signs: Reviewed and stable  Last Vitals:  Vitals Value Taken Time  BP 99/60 04/03/19 1039  Temp    Pulse 70 04/03/19 1040  Resp 19 04/03/19 1040  SpO2 95 % 04/03/19 1040  Vitals shown include unvalidated device data.  Last Pain:  Vitals:   04/03/19 0905  TempSrc: Oral  PainSc: 0-No pain         Complications: No apparent anesthesia complications

## 2019-04-03 NOTE — Anesthesia Postprocedure Evaluation (Signed)
Anesthesia Post Note  Patient: Annette Clark  Procedure(s) Performed: COLONOSCOPY WITH PROPOFOL (N/A ) POLYPECTOMY  Patient location during evaluation: Endoscopy Anesthesia Type: General Level of consciousness: awake and alert Pain management: pain level controlled Vital Signs Assessment: post-procedure vital signs reviewed and stable Respiratory status: spontaneous breathing, nonlabored ventilation, respiratory function stable and patient connected to nasal cannula oxygen Cardiovascular status: blood pressure returned to baseline and stable Postop Assessment: no apparent nausea or vomiting Anesthetic complications: no     Last Vitals:  Vitals:   04/03/19 1045 04/03/19 1056  BP: 114/61 95/79  Pulse: 66 69  Resp: 19 16  Temp:  36.5 C  SpO2: 94% 94%    Last Pain:  Vitals:   04/03/19 1056  TempSrc: Oral  PainSc: 0-No pain                 Talitha Givens

## 2019-04-03 NOTE — Op Note (Signed)
[facility] Patient Name: Annette Clark Procedure Date: 04/03/2019 9:51 AM MRN: [id number] Date of Birth: 1950-04-29 Attending MD: Norvel Richards , MD CSN: [id number] Age: 69 Admit Type: Outpatient Procedure:                Colonoscopy Indications:              Abnormal CT of the GI tract Providers:                Norvel Richards, MD, Janeece Riggers, RN, Aram Candela Referring MD:              Medicines:                Propofol per Anesthesia Complications:            No immediate complications. Estimated Blood Loss:     Estimated blood loss was minimal. Estimated blood                            loss was minimal. Procedure:                Pre-Anesthesia Assessment:                           - Prior to the procedure, a History and Physical                            was performed, and patient medications and                            allergies were reviewed. The patient's tolerance of                            previous anesthesia was also reviewed. The risks                            and benefits of the procedure and the sedation                            options and risks were discussed with the patient.                            All questions were answered, and informed consent                            was obtained. Prior Anticoagulants: The patient has                            taken no previous anticoagulant or antiplatelet                            agents. ASA Grade Assessment: II - A patient with  mild systemic disease. After reviewing the risks                            and benefits, the patient was deemed in                            satisfactory condition to undergo the procedure.                           After obtaining informed consent, the colonoscope                            was passed under direct vision. Throughout the                            procedure, the patient's blood pressure, pulse,  and                            oxygen saturations were monitored continuously. The                            [id number] scope was introduced through                            the anus and advanced to the the cecum, identified                            by appendiceal orifice and ileocecal valve. The                            colonoscopy was performed without difficulty. The                            patient tolerated the procedure well. The quality                            of the bowel preparation was adequate. The entire                            colon was well visualized. Scope In: 10:15:53 AM Scope Out: 10:31:22 AM Scope Withdrawal Time: 0 hours 10 minutes 14 seconds  Total Procedure Duration: 0 hours 15 minutes 29 seconds  Findings:      The perianal and digital rectal examinations were normal.      Scattered medium-mouthed diverticula were found in the sigmoid colon and       descending colon.      Four sessile polyps were found in the descending colon and cecum. The       polyps were 4 to 8 mm in size. These polyps were removed with a cold       snare. Resection and retrieval were complete. Estimated blood loss was       minimal.      The exam was otherwise without abnormality on direct and retroflexion       views. Impression:               -  Diverticulosis in the sigmoid colon and in the                            descending colon.                           - Four 4 to 8 mm polyps in the descending colon and                            in the cecum, removed with a cold snare. Resected                            and retrieved.                           - The examination was otherwise normal on direct                            and retroflexion views. Moderate Sedation:      Moderate (conscious) sedation was personally administered by an       anesthesia professional. The following parameters were monitored: oxygen       saturation, heart rate, blood pressure,  respiratory rate, EKG, adequacy       of pulmonary ventilation, and response to care. Recommendation:           - Patient has a contact number available for                            emergencies. The signs and symptoms of potential                            delayed complications were discussed with the                            patient. Return to normal activities tomorrow.                            Written discharge instructions were provided to the                            patient.                           - Advance diet as tolerated.                           - Continue present medications.                           - Repeat colonoscopy date to be determined after                            pending pathology results are reviewed for                            surveillance.                           -  Return to GI office in 3 weeks. Procedure Code(s):        --- Professional ---                           [phone], Colonoscopy, flexible; with removal of                            tumor(s), polyp(s), or other lesion(s) by snare                            technique Diagnosis Code(s):        --- Professional ---                           K63.5, Polyp of colon                           K57.30, Diverticulosis of large intestine without                            perforation or abscess without bleeding                           R93.3, Abnormal findings on diagnostic imaging of                            other parts of digestive tract CPT copyright 2019 American Medical Association. All rights reserved. The codes documented in this report are preliminary and upon coder review may  be revised to meet current compliance requirements. Cristopher Estimable. Paisli Silfies, MD Norvel Richards, MD 04/03/2019 10:41:40 AM This report has been signed electronically. Number of Addenda: 0
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
## 2019-04-03 NOTE — Anesthesia Preprocedure Evaluation (Signed)
Anesthesia Evaluation  Patient identified by MRN, date of birth, ID band Patient awake    Reviewed: Allergy & Precautions, NPO status , Patient's Chart, lab work & pertinent test results  History of Anesthesia Complications Negative for: history of anesthetic complications  Airway Mallampati: III  TM Distance: >3 FB Neck ROM: Full    Dental  (+) Partial Lower, Caps, Dental Advisory Given   Pulmonary Sleep apnea: snoring. , former smoker,    Pulmonary exam normal breath sounds clear to auscultation       Cardiovascular Exercise Tolerance: Good hypertension, Pt. on medications  Rhythm:Regular Rate:Normal     Neuro/Psych negative psych ROS   GI/Hepatic Neg liver ROS, Bowel prep,H/o nausea and vomiting   Endo/Other  Hypothyroidism   Renal/GU negative Renal ROS     Musculoskeletal  (+) Arthritis , Osteoarthritis,    Abdominal Normal abdominal exam  (+)   Peds  Hematology  (+) anemia ,   Anesthesia Other Findings   Reproductive/Obstetrics                             Anesthesia Physical Anesthesia Plan  ASA: III  Anesthesia Plan: General   Post-op Pain Management:    Induction: Intravenous  PONV Risk Score and Plan: 2 and TIVA and Treatment may vary due to age or medical condition  Airway Management Planned: Nasal Cannula, Natural Airway and Simple Face Mask  Additional Equipment:   Intra-op Plan:   Post-operative Plan:   Informed Consent: I have reviewed the patients History and Physical, chart, labs and discussed the procedure including the risks, benefits and alternatives for the proposed anesthesia with the patient or authorized representative who has indicated his/her understanding and acceptance.     Dental advisory given  Plan Discussed with: CRNA  Anesthesia Plan Comments:         Anesthesia Quick Evaluation

## 2019-04-04 ENCOUNTER — Encounter: Payer: Self-pay | Admitting: Internal Medicine

## 2019-04-04 LAB — SURGICAL PATHOLOGY

## 2019-04-06 ENCOUNTER — Ambulatory Visit (HOSPITAL_COMMUNITY)
Admission: RE | Admit: 2019-04-06 | Discharge: 2019-04-06 | Disposition: A | Discharge status: Home / Self Care | Payer: Medicare Other | Source: Ambulatory Visit | Attending: Family Medicine | Admitting: Family Medicine

## 2019-04-06 ENCOUNTER — Other Ambulatory Visit: Payer: Self-pay

## 2019-04-06 DIAGNOSIS — Z1231 Encounter for screening mammogram for malignant neoplasm of breast: Secondary | ICD-10-CM

## 2019-04-10 ENCOUNTER — Other Ambulatory Visit (HOSPITAL_COMMUNITY): Payer: Self-pay | Admitting: Family Medicine

## 2019-04-10 DIAGNOSIS — R928 Other abnormal and inconclusive findings on diagnostic imaging of breast: Secondary | ICD-10-CM

## 2019-04-11 ENCOUNTER — Encounter: Payer: Self-pay | Admitting: Gastroenterology

## 2019-04-11 DIAGNOSIS — I10 Essential (primary) hypertension: Secondary | ICD-10-CM | POA: Diagnosis not present

## 2019-04-11 DIAGNOSIS — E785 Hyperlipidemia, unspecified: Secondary | ICD-10-CM | POA: Diagnosis not present

## 2019-04-11 DIAGNOSIS — R7309 Other abnormal glucose: Secondary | ICD-10-CM | POA: Diagnosis not present

## 2019-04-11 DIAGNOSIS — Z0001 Encounter for general adult medical examination with abnormal findings: Secondary | ICD-10-CM | POA: Diagnosis not present

## 2019-04-11 DIAGNOSIS — Z6841 Body Mass Index (BMI) 40.0 and over, adult: Secondary | ICD-10-CM | POA: Diagnosis not present

## 2019-04-11 DIAGNOSIS — E7849 Other hyperlipidemia: Secondary | ICD-10-CM | POA: Diagnosis not present

## 2019-04-11 DIAGNOSIS — E039 Hypothyroidism, unspecified: Secondary | ICD-10-CM | POA: Diagnosis not present

## 2019-04-11 DIAGNOSIS — Z1389 Encounter for screening for other disorder: Secondary | ICD-10-CM | POA: Diagnosis not present

## 2019-04-11 DIAGNOSIS — M1991 Primary osteoarthritis, unspecified site: Secondary | ICD-10-CM | POA: Diagnosis not present

## 2019-04-18 ENCOUNTER — Encounter (HOSPITAL_COMMUNITY): Payer: Self-pay

## 2019-04-18 ENCOUNTER — Ambulatory Visit (HOSPITAL_COMMUNITY)
Admission: RE | Admit: 2019-04-18 | Discharge: 2019-04-18 | Disposition: A | Discharge status: Home / Self Care | Payer: Medicare Other | Source: Ambulatory Visit | Attending: Family Medicine | Admitting: Family Medicine

## 2019-04-18 ENCOUNTER — Other Ambulatory Visit: Payer: Self-pay

## 2019-04-18 DIAGNOSIS — R928 Other abnormal and inconclusive findings on diagnostic imaging of breast: Secondary | ICD-10-CM

## 2019-04-18 DIAGNOSIS — N6489 Other specified disorders of breast: Secondary | ICD-10-CM | POA: Diagnosis not present

## 2019-04-19 DIAGNOSIS — I10 Essential (primary) hypertension: Secondary | ICD-10-CM | POA: Diagnosis not present

## 2019-04-19 DIAGNOSIS — E785 Hyperlipidemia, unspecified: Secondary | ICD-10-CM | POA: Diagnosis not present

## 2019-04-19 DIAGNOSIS — E039 Hypothyroidism, unspecified: Secondary | ICD-10-CM | POA: Diagnosis not present

## 2019-05-04 ENCOUNTER — Other Ambulatory Visit: Payer: Self-pay

## 2019-05-04 ENCOUNTER — Encounter: Payer: Self-pay | Admitting: Gastroenterology

## 2019-05-04 ENCOUNTER — Ambulatory Visit (INDEPENDENT_AMBULATORY_CARE_PROVIDER_SITE_OTHER): Payer: Medicare Other | Admitting: Gastroenterology

## 2019-05-04 VITALS — BP 128/81 | HR 67 | Temp 97.0°F | Ht 64.0 in | Wt 276.6 lb

## 2019-05-04 DIAGNOSIS — R634 Abnormal weight loss: Secondary | ICD-10-CM | POA: Diagnosis not present

## 2019-05-04 DIAGNOSIS — I728 Aneurysm of other specified arteries: Secondary | ICD-10-CM | POA: Diagnosis not present

## 2019-05-04 NOTE — Patient Instructions (Signed)
We are arranging a CT scan in the near future. If this is normal, we will pursue an upper endoscopy.  I will get the blood work from your primary care as well.  Further recommendations to follow!  I enjoyed seeing you again today! As you know, I value our relationship and want to provide genuine, compassionate, and quality care. I welcome your feedback. If you receive a survey regarding your visit,  I greatly appreciate you taking time to fill this out. See you next time!  Annitta Needs, PhD, ANP-BC [facility]
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
## 2019-05-16 DIAGNOSIS — R634 Abnormal weight loss: Secondary | ICD-10-CM | POA: Diagnosis not present

## 2019-05-16 LAB — CREATININE, SERUM: Creat: 0.67 mg/dL (ref 0.50–0.99)

## 2019-05-19 ENCOUNTER — Ambulatory Visit (HOSPITAL_COMMUNITY)
Admission: RE | Admit: 2019-05-19 | Discharge: 2019-05-19 | Disposition: A | Discharge status: Home / Self Care | Payer: Medicare Other | Source: Ambulatory Visit | Attending: Gastroenterology | Admitting: Gastroenterology

## 2019-05-19 ENCOUNTER — Other Ambulatory Visit: Payer: Self-pay

## 2019-05-19 DIAGNOSIS — K5730 Diverticulosis of large intestine without perforation or abscess without bleeding: Secondary | ICD-10-CM | POA: Diagnosis not present

## 2019-05-19 DIAGNOSIS — R634 Abnormal weight loss: Secondary | ICD-10-CM | POA: Insufficient documentation

## 2019-05-19 DIAGNOSIS — I728 Aneurysm of other specified arteries: Secondary | ICD-10-CM | POA: Diagnosis not present

## 2019-05-19 MED ORDER — IOHEXOL 300 MG/ML  SOLN
100.0000 mL | Freq: Once | INTRAMUSCULAR | Status: AC | PRN
  Administered 2019-05-19: 100 mL via INTRAVENOUS

## 2019-05-23 ENCOUNTER — Encounter: Payer: Self-pay | Admitting: *Deleted

## 2019-05-23 ENCOUNTER — Telehealth: Payer: Self-pay | Admitting: *Deleted

## 2019-05-23 NOTE — Progress Notes (Signed)
On recall  °

## 2019-05-23 NOTE — Telephone Encounter (Signed)
Called spoke with pt. She is scheduled for EGD with propofol with RMR 7/6 at 12:45pm. Patient aware will mail instructions with her pre-op/covid test appt. Orders entered.

## 2019-06-19 DIAGNOSIS — J449 Chronic obstructive pulmonary disease, unspecified: Secondary | ICD-10-CM | POA: Diagnosis not present

## 2019-06-19 DIAGNOSIS — M47817 Spondylosis without myelopathy or radiculopathy, lumbosacral region: Secondary | ICD-10-CM | POA: Diagnosis not present

## 2019-06-19 DIAGNOSIS — I110 Hypertensive heart disease with heart failure: Secondary | ICD-10-CM | POA: Diagnosis not present

## 2019-06-19 DIAGNOSIS — I5022 Chronic systolic (congestive) heart failure: Secondary | ICD-10-CM | POA: Diagnosis not present

## 2019-07-21 ENCOUNTER — Other Ambulatory Visit: Payer: Self-pay

## 2019-07-21 ENCOUNTER — Other Ambulatory Visit (HOSPITAL_COMMUNITY): Admission: RE | Admit: 2019-07-21 | Payer: Medicare Other | Source: Ambulatory Visit

## 2019-07-21 ENCOUNTER — Encounter (HOSPITAL_COMMUNITY)
Admission: RE | Admit: 2019-07-21 | Discharge: 2019-07-21 | Disposition: A | Discharge status: Home / Self Care | Payer: Medicare Other | Source: Ambulatory Visit | Attending: Internal Medicine | Admitting: Internal Medicine

## 2019-07-21 NOTE — Progress Notes (Signed)
Ms Hedberg's family had planned a large birthday party for her this weekend.  OK to do rapid Covid am of procedure per Abbie Sons RN

## 2019-07-25 ENCOUNTER — Ambulatory Visit (HOSPITAL_COMMUNITY)
Admission: RE | Admit: 2019-07-25 | Discharge: 2019-07-25 | Disposition: A | Discharge status: Home / Self Care | Payer: Medicare Other | Attending: Internal Medicine | Admitting: Internal Medicine

## 2019-07-25 ENCOUNTER — Other Ambulatory Visit: Payer: Self-pay

## 2019-07-25 ENCOUNTER — Ambulatory Visit (HOSPITAL_COMMUNITY): Payer: Medicare Other | Admitting: Anesthesiology

## 2019-07-25 ENCOUNTER — Other Ambulatory Visit (HOSPITAL_COMMUNITY)
Admission: RE | Admit: 2019-07-25 | Discharge: 2019-07-25 | Disposition: A | Discharge status: Home / Self Care | Payer: Medicare Other | Source: Ambulatory Visit | Attending: Internal Medicine | Admitting: Internal Medicine

## 2019-07-25 ENCOUNTER — Encounter (HOSPITAL_COMMUNITY)
Admission: RE | Disposition: A | Discharge status: Home / Self Care | Payer: Self-pay | Source: Home / Self Care | Attending: Internal Medicine

## 2019-07-25 ENCOUNTER — Encounter (HOSPITAL_COMMUNITY): Payer: Self-pay | Admitting: Internal Medicine

## 2019-07-25 DIAGNOSIS — K3189 Other diseases of stomach and duodenum: Secondary | ICD-10-CM | POA: Diagnosis not present

## 2019-07-25 DIAGNOSIS — M1990 Unspecified osteoarthritis, unspecified site: Secondary | ICD-10-CM | POA: Insufficient documentation

## 2019-07-25 DIAGNOSIS — K209 Esophagitis, unspecified without bleeding: Secondary | ICD-10-CM

## 2019-07-25 DIAGNOSIS — Z20822 Contact with and (suspected) exposure to covid-19: Secondary | ICD-10-CM | POA: Insufficient documentation

## 2019-07-25 DIAGNOSIS — Z79890 Hormone replacement therapy (postmenopausal): Secondary | ICD-10-CM | POA: Diagnosis not present

## 2019-07-25 DIAGNOSIS — Z87891 Personal history of nicotine dependence: Secondary | ICD-10-CM | POA: Insufficient documentation

## 2019-07-25 DIAGNOSIS — E039 Hypothyroidism, unspecified: Secondary | ICD-10-CM | POA: Diagnosis not present

## 2019-07-25 DIAGNOSIS — R6881 Early satiety: Secondary | ICD-10-CM | POA: Insufficient documentation

## 2019-07-25 DIAGNOSIS — E669 Obesity, unspecified: Secondary | ICD-10-CM | POA: Insufficient documentation

## 2019-07-25 DIAGNOSIS — Z79899 Other long term (current) drug therapy: Secondary | ICD-10-CM | POA: Insufficient documentation

## 2019-07-25 DIAGNOSIS — I10 Essential (primary) hypertension: Secondary | ICD-10-CM | POA: Diagnosis not present

## 2019-07-25 DIAGNOSIS — Z7982 Long term (current) use of aspirin: Secondary | ICD-10-CM | POA: Insufficient documentation

## 2019-07-25 DIAGNOSIS — K30 Functional dyspepsia: Secondary | ICD-10-CM | POA: Diagnosis not present

## 2019-07-25 DIAGNOSIS — R634 Abnormal weight loss: Secondary | ICD-10-CM | POA: Diagnosis not present

## 2019-07-25 DIAGNOSIS — K2100 Gastro-esophageal reflux disease with esophagitis, without bleeding: Secondary | ICD-10-CM | POA: Insufficient documentation

## 2019-07-25 DIAGNOSIS — I739 Peripheral vascular disease, unspecified: Secondary | ICD-10-CM | POA: Insufficient documentation

## 2019-07-25 DIAGNOSIS — Z8543 Personal history of malignant neoplasm of ovary: Secondary | ICD-10-CM | POA: Insufficient documentation

## 2019-07-25 DIAGNOSIS — K259 Gastric ulcer, unspecified as acute or chronic, without hemorrhage or perforation: Secondary | ICD-10-CM | POA: Diagnosis not present

## 2019-07-25 DIAGNOSIS — Z6841 Body Mass Index (BMI) 40.0 and over, adult: Secondary | ICD-10-CM | POA: Insufficient documentation

## 2019-07-25 HISTORY — PX: ESOPHAGOGASTRODUODENOSCOPY (EGD) WITH PROPOFOL: SHX5813

## 2019-07-25 LAB — SARS CORONAVIRUS 2 BY RT PCR (HOSPITAL ORDER, PERFORMED IN ~~LOC~~ HOSPITAL LAB): SARS Coronavirus 2: NEGATIVE

## 2019-07-25 SURGERY — ESOPHAGOGASTRODUODENOSCOPY (EGD) WITH PROPOFOL
Anesthesia: General

## 2019-07-25 MED ORDER — PROPOFOL 10 MG/ML IV BOLUS
INTRAVENOUS | Status: AC
  Filled 2019-07-25: qty 60

## 2019-07-25 MED ORDER — LIDOCAINE HCL (CARDIAC) PF 50 MG/5ML IV SOSY
PREFILLED_SYRINGE | INTRAVENOUS | Status: DC | PRN
  Administered 2019-07-25: 60 mg via INTRAVENOUS

## 2019-07-25 MED ORDER — CHLORHEXIDINE GLUCONATE CLOTH 2 % EX PADS: 6.0000 | MEDICATED_PAD | Freq: Once | CUTANEOUS | Status: DC

## 2019-07-25 MED ORDER — LACTATED RINGERS IV SOLN
INTRAVENOUS | Status: DC | PRN
Start: 2019-07-25 — End: 2019-07-25

## 2019-07-25 MED ORDER — PROPOFOL 500 MG/50ML IV EMUL
INTRAVENOUS | Status: DC | PRN
  Administered 2019-07-25: 175 ug/kg/min via INTRAVENOUS

## 2019-07-25 MED ORDER — LACTATED RINGERS IV SOLN: Freq: Once | INTRAVENOUS | Status: AC

## 2019-07-25 MED ORDER — KETAMINE HCL 50 MG/5ML IJ SOSY
PREFILLED_SYRINGE | INTRAMUSCULAR | Status: AC
  Filled 2019-07-25: qty 5

## 2019-07-25 MED ORDER — PROPOFOL 10 MG/ML IV BOLUS
INTRAVENOUS | Status: DC | PRN
  Administered 2019-07-25: 50 mg via INTRAVENOUS

## 2019-07-25 MED ORDER — KETAMINE HCL 10 MG/ML IJ SOLN
INTRAMUSCULAR | Status: DC | PRN
  Administered 2019-07-25: 15 mg via INTRAVENOUS

## 2019-07-25 MED ORDER — GLYCOPYRROLATE 0.2 MG/ML IJ SOLN
0.2000 mg | Freq: Once | INTRAMUSCULAR | Status: AC
  Administered 2019-07-25: 0.2 mg via INTRAVENOUS
  Filled 2019-07-25: qty 1

## 2019-07-25 MED ORDER — LIDOCAINE VISCOUS HCL 2 % MT SOLN
15.0000 mL | Freq: Once | OROMUCOSAL | Status: AC
  Administered 2019-07-25: 15 mL via OROMUCOSAL

## 2019-07-25 MED ORDER — LIDOCAINE VISCOUS HCL 2 % MT SOLN
OROMUCOSAL | Status: AC
  Filled 2019-07-25: qty 15

## 2019-07-25 NOTE — Anesthesia Procedure Notes (Signed)
Date/Time: 07/25/2019 12:29 PM Performed by: Vista Deck, CRNA Pre-anesthesia Checklist: Patient identified, Emergency Drugs available, Suction available, Timeout performed and Patient being monitored Patient Re-evaluated:Patient Re-evaluated prior to induction Oxygen Delivery Method: Non-rebreather mask

## 2019-07-25 NOTE — Discharge Instructions (Signed)
EGD Discharge instructions Please read the instructions outlined below and refer to this sheet in the next few weeks. These discharge instructions provide you with general information on caring for yourself after you leave the hospital. Your doctor may also give you specific instructions. While your treatment has been planned according to the most current medical practices available, unavoidable complications occasionally occur. If you have any problems or questions after discharge, please call your doctor. ACTIVITY  You may resume your regular activity but move at a slower pace for the next 24 hours.   Take frequent rest periods for the next 24 hours.   Walking will help expel (get rid of) the air and reduce the bloated feeling in your abdomen.   No driving for 24 hours (because of the anesthesia (medicine) used during the test).   You may shower.   Do not sign any important legal documents or operate any machinery for 24 hours (because of the anesthesia used during the test).  NUTRITION  Drink plenty of fluids.   You may resume your normal diet.   Begin with a light meal and progress to your normal diet.   Avoid alcoholic beverages for 24 hours or as instructed by your caregiver.  MEDICATIONS  You may resume your normal medications unless your caregiver tells you otherwise.  WHAT YOU CAN EXPECT TODAY  You may experience abdominal discomfort such as a feeling of fullness or gas pains.  FOLLOW-UP  Your doctor will discuss the results of your test with you.  SEEK IMMEDIATE MEDICAL ATTENTION IF ANY OF THE FOLLOWING OCCUR:  Excessive nausea (feeling sick to your stomach) and/or vomiting.   Severe abdominal pain and distention (swelling).   Trouble swallowing.   Temperature over 101 F (37.8 C).   Rectal bleeding or vomiting of blood.    You have an inflamed stomach. Biopsies taken.  You also have acid reflux irritation in your esophagus.  GERD information  provided  Begin Protonix 40 mg once daily -new prescription provided  Office visit with [place] in 12 weeks  Further recommendations to follow pending review of pathology report  At patient request I called [name] at [phone] -left message with results     Gastroesophageal Reflux Disease, Adult Gastroesophageal reflux (GER) happens when acid from the stomach flows up into the tube that connects the mouth and the stomach (esophagus). Normally, food travels down the esophagus and stays in the stomach to be digested. With GER, food and stomach acid sometimes move back up into the esophagus. You may have a disease called gastroesophageal reflux disease (GERD) if the reflux:  Happens often.  Causes frequent or very bad symptoms.  Causes problems such as damage to the esophagus. When this happens, the esophagus becomes sore and swollen (inflamed). Over time, GERD can make small holes (ulcers) in the lining of the esophagus. What are the causes? This condition is caused by a problem with the muscle between the esophagus and the stomach. When this muscle is weak or not normal, it does not close properly to keep food and acid from coming back up from the stomach. The muscle can be weak because of:  Tobacco use.  Pregnancy.  Having a certain type of hernia (hiatal hernia).  Alcohol use.  Certain foods and drinks, such as coffee, chocolate, onions, and peppermint. What increases the risk? You are more likely to develop this condition if you:  Are overweight.  Have a disease that affects your connective tissue.  Use NSAID medicines.  What are the signs or symptoms? Symptoms of this condition include:  Heartburn.  Difficult or painful swallowing.  The feeling of having a lump in the throat.  A bitter taste in the mouth.  Bad breath.  Having a lot of saliva.  Having an upset or bloated stomach.  Belching.  Chest pain. Different conditions can cause chest pain. Make sure  you see your doctor if you have chest pain.  Shortness of breath or noisy breathing (wheezing).  Ongoing (chronic) cough or a cough at night.  Wearing away of the surface of teeth (tooth enamel).  Weight loss. How is this treated? Treatment will depend on how bad your symptoms are. Your doctor may suggest:  Changes to your diet.  Medicine.  Surgery. Follow these instructions at home: Eating and drinking   Follow a diet as told by your doctor. You may need to avoid foods and drinks such as: ? Coffee and tea (with or without caffeine). ? Drinks that contain alcohol. ? Energy drinks and sports drinks. ? Bubbly (carbonated) drinks or sodas. ? Chocolate and cocoa. ? Peppermint and mint flavorings. ? Garlic and onions. ? Horseradish. ? Spicy and acidic foods. These include peppers, chili powder, curry powder, vinegar, hot sauces, and BBQ sauce. ? Citrus fruit juices and citrus fruits, such as oranges, lemons, and limes. ? Tomato-based foods. These include red sauce, chili, salsa, and pizza with red sauce. ? Fried and fatty foods. These include donuts, french fries, potato chips, and high-fat dressings. ? High-fat meats. These include hot dogs, rib eye steak, sausage, ham, and bacon. ? High-fat dairy items, such as whole milk, butter, and cream cheese.  Eat small meals often. Avoid eating large meals.  Avoid drinking large amounts of liquid with your meals.  Avoid eating meals during the 2-3 hours before bedtime.  Avoid lying down right after you eat.  Do not exercise right after you eat. Lifestyle   Do not use any products that contain nicotine or tobacco. These include cigarettes, e-cigarettes, and chewing tobacco. If you need help quitting, ask your doctor.  Try to lower your stress. If you need help doing this, ask your doctor.  If you are overweight, lose an amount of weight that is healthy for you. Ask your doctor about a safe weight loss goal. General  instructions  Pay attention to any changes in your symptoms.  Take over-the-counter and prescription medicines only as told by your doctor. Do not take aspirin, ibuprofen, or other NSAIDs unless your doctor says it is okay.  Wear loose clothes. Do not wear anything tight around your waist.  Raise (elevate) the head of your bed about 6 inches (15 cm).  Avoid bending over if this makes your symptoms worse.  Keep all follow-up visits as told by your doctor. This is important. Contact a doctor if:  You have new symptoms.  You lose weight and you do not know why.  You have trouble swallowing or it hurts to swallow.  You have wheezing or a cough that keeps happening.  Your symptoms do not get better with treatment.  You have a hoarse voice. Get help right away if:  You have pain in your arms, neck, jaw, teeth, or back.  You feel sweaty, dizzy, or light-headed.  You have chest pain or shortness of breath.  You throw up (vomit) and your throw-up looks like blood or coffee grounds.  You pass out (faint).  Your poop (stool) is bloody or black.  You cannot  swallow, drink, or eat. Summary  If a person has gastroesophageal reflux disease (GERD), food and stomach acid move back up into the esophagus and cause symptoms or problems such as damage to the esophagus.  Treatment will depend on how bad your symptoms are.  Follow a diet as told by your doctor.  Take all medicines only as told by your doctor. This information is not intended to replace advice given to you by your health care provider. Make sure you discuss any questions you have with your health care provider. Document Revised: 07/14/2017 Document Reviewed: 07/14/2017 Elsevier Patient Education  2020 [place] After These instructions provide you with information about caring for yourself after your procedure. Your health care provider may also give you more specific  instructions. Your treatment has been planned according to current medical practices, but problems sometimes occur. Call your health care provider if you have any problems or questions after your procedure. What can I expect after the procedure? After your procedure, you may:  Feel sleepy for several hours.  Feel clumsy and have poor balance for several hours.  Feel forgetful about what happened after the procedure.  Have poor judgment for several hours.  Feel nauseous or vomit.  Have a sore throat if you had a breathing tube during the procedure. Follow these instructions at home: For at least 24 hours after the procedure:      Have a responsible adult stay with you. It is important to have someone help care for you until you are awake and alert.  Rest as needed.  Do not: ? Participate in activities in which you could fall or become injured. ? Drive. ? Use heavy machinery. ? Drink alcohol. ? Take sleeping pills or medicines that cause drowsiness. ? Make important decisions or sign legal documents. ? Take care of children on your own. Eating and drinking  Follow the diet that is recommended by your health care provider.  If you vomit, drink water, juice, or soup when you can drink without vomiting.  Make sure you have little or no nausea before eating solid foods. General instructions  Take over-the-counter and prescription medicines only as told by your health care provider.  If you have sleep apnea, surgery and certain medicines can increase your risk for breathing problems. Follow instructions from your health care provider about wearing your sleep device: ? Anytime you are sleeping, including during daytime naps. ? While taking prescription pain medicines, sleeping medicines, or medicines that make you drowsy.  If you smoke, do not smoke without supervision.  Keep all follow-up visits as told by your health care provider. This is important. Contact a health care  provider if:  You keep feeling nauseous or you keep vomiting.  You feel light-headed.  You develop a rash.  You have a fever. Get help right away if:  You have trouble breathing. Summary  For several hours after your procedure, you may feel sleepy and have poor judgment.  Have a responsible adult stay with you for at least 24 hours or until you are awake and alert. This information is not intended to replace advice given to you by your health care provider. Make sure you discuss any questions you have with your health care provider. Document Revised: 04/05/2017 Document Reviewed: 04/28/2015 Elsevier Patient Education  [place].
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
## 2019-07-25 NOTE — Transfer of Care (Signed)
Immediate Anesthesia Transfer of Care Note  Patient: Annette Clark  Procedure(s) Performed: ESOPHAGOGASTRODUODENOSCOPY (EGD) WITH PROPOFOL (N/A )  Patient Location: PACU  Anesthesia Type:General  Level of Consciousness: awake and patient cooperative  Airway & Oxygen Therapy: Patient Spontanous Breathing  Post-op Assessment: Report given to RN and Post -op Vital signs reviewed and stable  Post vital signs: Reviewed and stable  Last Vitals:  Vitals Value Taken Time  BP    Temp 98.3   Pulse 70 07/25/19 1249  Resp 21 07/25/19 1249  SpO2 90 % 07/25/19 1249  Vitals shown include unvalidated device data.  Last Pain:  Vitals:   07/25/19 1154  TempSrc: Oral  PainSc: 0-No pain         Complications: No complications documented.

## 2019-07-25 NOTE — Anesthesia Postprocedure Evaluation (Signed)
Anesthesia Post Note  Patient: Annette Clark  Procedure(s) Performed: ESOPHAGOGASTRODUODENOSCOPY (EGD) WITH PROPOFOL (N/A )  Patient location during evaluation: PACU Anesthesia Type: General Level of consciousness: awake and alert and patient cooperative Pain management: satisfactory to patient Vital Signs Assessment: post-procedure vital signs reviewed and stable Respiratory status: spontaneous breathing Cardiovascular status: stable Postop Assessment: no apparent nausea or vomiting Anesthetic complications: no   No complications documented.   Last Vitals:  Vitals:   07/25/19 1154 07/25/19 1250  BP: 128/65 118/66  Pulse: 68 70  Resp: 19 17  Temp: 37.3 C 36.8 C  SpO2: 93% 94%    Last Pain:  Vitals:   07/25/19 1250  TempSrc:   PainSc: 0-No pain                 Daviyon Widmayer

## 2019-07-25 NOTE — Anesthesia Preprocedure Evaluation (Addendum)
Anesthesia Evaluation  Patient identified by MRN, date of birth, ID band Patient awake    Reviewed: Allergy & Precautions, NPO status , Patient's Chart, lab work & pertinent test results  History of Anesthesia Complications Negative for: history of anesthetic complications  Airway Mallampati: III  TM Distance: >3 FB Neck ROM: Full    Dental  (+) Partial Lower, Caps, Dental Advisory Given   Pulmonary shortness of breath and with exertion, Sleep apnea: snoring. , former smoker,    Pulmonary exam normal breath sounds clear to auscultation       Cardiovascular Exercise Tolerance: Good hypertension, Pt. 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 Per 2020 Vascular .Surgery Guidelines, recommending observation over repair for less than 3 cm stable asymptomatic aneurysms. No acute findings. We need to arrange EGD with Propofol due to weight loss, early satiety as per plan.   [facility] clinical pool: please arrange EGD with Propofol with Dr. Gala Romney. We also need CT on recall for April 2022 for surveillance of splenic artery aneurysm. If she becomes symptomatic (with vague malaise, discomfort, N/V) need to refer to Vascular.     Reproductive/Obstetrics                            Anesthesia Physical  Anesthesia Plan  ASA: III  Anesthesia Plan: General   Post-op Pain Management:     Induction: Intravenous  PONV Risk Score and Plan: 2 and TIVA and Treatment may vary due to age or medical condition  Airway Management Planned: Nasal Cannula, Natural Airway and Simple Face Mask  Additional Equipment:   Intra-op Plan:   Post-operative Plan:   Informed Consent: I have reviewed the patients History and Physical, chart, labs and discussed the procedure including the risks, benefits and alternatives for the proposed anesthesia with the patient or authorized representative who has indicated his/her understanding and acceptance.     Dental advisory given  Plan Discussed with: CRNA and Surgeon  Anesthesia Plan Comments:        Anesthesia Quick Evaluation

## 2019-07-25 NOTE — Op Note (Signed)
[facility] Patient Name: Annette Clark Procedure Date: 07/25/2019 12:22 PM MRN: [id number] Date of Birth: 07/02/1950 Attending MD: Norvel Richards , MD CSN: [id number] Age: 69 Admit Type: Outpatient Procedure:                Upper GI endoscopy Indications:              Functional Dyspepsia, Early satiety, Weight loss Providers:                Norvel Richards, MD, Janeece Riggers, RN, Nelma Rothman, Technician Referring MD:              Medicines:                Propofol per Anesthesia Complications:            No immediate complications. Estimated Blood Loss:     Estimated blood loss was minimal. Procedure:                Pre-Anesthesia Assessment:                           - Prior to the procedure, a History and Physical                            was performed, and patient medications and                            allergies were reviewed. The patient's tolerance of                            previous anesthesia was also reviewed. The risks                            and benefits of the procedure and the sedation                            options and risks were discussed with the patient.                            All questions were answered, and informed consent                            was obtained. Prior Anticoagulants: The patient has                            taken no previous anticoagulant or antiplatelet                            agents. ASA Grade Assessment: II - A patient with                            mild systemic disease. After reviewing the risks  and benefits, the patient was deemed in                            satisfactory condition to undergo the procedure.                           After obtaining informed consent, the endoscope was                            passed under direct vision. Throughout the                            procedure, the patient's blood pressure, pulse, and                             oxygen saturations were monitored continuously. The                            GIF-H190 ([id number]) scope was introduced through the                            mouth, and advanced to the second part of duodenum.                            The upper GI endoscopy was accomplished without                            difficulty. The patient tolerated the procedure                            well. Scope In: 12:37:24 PM Scope Out: 12:40:56 PM Total Procedure Duration: 0 hours 3 minutes 32 seconds  Findings:      Esophagitis was found. Single 6 mm erosion straddling the GE junction.       No tumor. No Barrett's epithelium seen.      Scattered gastric erosions. No ulcer or infiltrating process seen.       Duodenal bulbar erosions present; otherwise, duodenal bulb and second       portion of the duodenum were normal.      Biopsies of the gastric mucosa taken for histologic study. Impression:               -Erosive reflux esophagitis                           - Erosive gastropathy -Likely NSAID effect?"status                            post gastric biopsy - rule out H. pylori                           - Normal duodenal bulb and second portion of the                            duodenum.                           -  Patient states early satiety symptoms and weight                            loss have improved - states she cannot eat as much                            as she could previously (in the past, could eat an                            entire pizza by herself; now gets full after eating                            3 slices) Moderate Sedation:      Moderate (conscious) sedation was personally administered by an       anesthesia professional. The following parameters were monitored: oxygen       saturation, heart rate, blood pressure, respiratory rate, EKG, adequacy       of pulmonary ventilation, and response to care. Recommendation:           - Patient has a contact number available for                             emergencies. The signs and symptoms of potential                            delayed complications were discussed with the                            patient. Return to normal activities tomorrow.                            Written discharge instructions were provided to the                            patient.                           - Resume previous diet.                           - Continue present medications. Begin Protonix 40                            mg once daily.                           - Await pathology results. Office visit with [place] in                            3 months Procedure Code(s):        --- Professional ---                           [id number], Esophagogastroduodenoscopy, flexible,  transoral; diagnostic, including collection of                            specimen(s) by brushing or washing, when performed                            (separate procedure) Diagnosis Code(s):        --- Professional ---                           K20.90, Esophagitis, unspecified without bleeding                           K31.89, Other diseases of stomach and duodenum                           K30, Functional dyspepsia                           R68.81, Early satiety                           R63.4, Abnormal weight loss CPT copyright 2019 American Medical Association. All rights reserved. The codes documented in this report are preliminary and upon coder review may  be revised to meet current compliance requirements. Cristopher Estimable. Siddhanth Denk, MD Norvel Richards, MD 07/25/2019 1:02:09 PM This report has been signed electronically. Number of Addenda: 0

## 2019-07-26 ENCOUNTER — Other Ambulatory Visit: Payer: Self-pay

## 2019-07-26 LAB — SURGICAL PATHOLOGY

## 2019-07-27 ENCOUNTER — Encounter: Payer: Self-pay | Admitting: Internal Medicine

## 2019-08-02 ENCOUNTER — Encounter (HOSPITAL_COMMUNITY): Payer: Self-pay | Admitting: Internal Medicine

## 2019-08-18 DIAGNOSIS — E7849 Other hyperlipidemia: Secondary | ICD-10-CM | POA: Diagnosis not present

## 2019-08-18 DIAGNOSIS — I10 Essential (primary) hypertension: Secondary | ICD-10-CM | POA: Diagnosis not present

## 2019-08-18 DIAGNOSIS — M169 Osteoarthritis of hip, unspecified: Secondary | ICD-10-CM | POA: Diagnosis not present

## 2019-10-05 ENCOUNTER — Other Ambulatory Visit: Payer: Self-pay

## 2019-10-05 ENCOUNTER — Ambulatory Visit: Payer: Medicare Other

## 2019-10-05 ENCOUNTER — Encounter: Payer: Self-pay | Admitting: Orthopedic Surgery

## 2019-10-05 ENCOUNTER — Ambulatory Visit (INDEPENDENT_AMBULATORY_CARE_PROVIDER_SITE_OTHER): Payer: Medicare Other | Admitting: Orthopedic Surgery

## 2019-10-05 VITALS — BP 146/97 | HR 62 | Ht 64.0 in | Wt 275.0 lb

## 2019-10-05 DIAGNOSIS — M25511 Pain in right shoulder: Secondary | ICD-10-CM

## 2019-10-05 DIAGNOSIS — Z6841 Body Mass Index (BMI) 40.0 and over, adult: Secondary | ICD-10-CM | POA: Diagnosis not present

## 2019-10-05 DIAGNOSIS — M7521 Bicipital tendinitis, right shoulder: Secondary | ICD-10-CM | POA: Diagnosis not present

## 2019-10-05 NOTE — Patient Instructions (Addendum)
You have received an injection of steroids into the joint. 15% of patients will have increased pain within the 24 hours postinjection.   This is transient and will go away.   We recommend that you use ice packs on the injection site for 20 minutes every 2 hours and extra strength Tylenol 2 tablets every 8 as needed until the pain resolves.  If you continue to have pain after taking the Tylenol and using the ice please call the office for further instructions.  Proximal Biceps Tendinitis and Tenosynovitis  The proximal biceps tendon is a strong cord of tissue that connects the biceps muscle on the front of the upper arm to the shoulder blade. Tendinitis is inflammation of a tendon. Tenosynovitis is inflammation of the lining around the tendon (tendon sheath). These conditions often occur at the same time, and they can interfere with the ability to bend the elbow and turn the palm of the hand up. Proximal biceps tendinitis and tenosynovitis are usually caused by overusing the shoulder joint and the biceps muscle. These conditions usually heal within 6 weeks. Proximal biceps tendinitis may include a grade 1 or grade 2 strain of the tendon.  A grade 1 strain is mild, and it involves a slight pull of the tendon without any stretching or noticeable tearing of the tendon. There is usually no loss of biceps muscle strength.  A grade 2 strain is moderate, and it involves a small tear in the tendon. The tendon is stretched, and biceps strength is usually decreased. What are the causes? This condition may be caused by:  A sudden increase in frequency or intensity of activity that involves the shoulder and the biceps muscle.  Overuse of the biceps muscle. This can happen when you do the same movements over and over, such as: ? Turning the palm of the hand up. ? Forceful straightening (hyperextension) of the elbow. ? Bending the elbow.  A direct, forceful hit or injury to the elbow. This is rare. What  increases the risk? The following factors may make you more likely to develop this condition:  Playing contact sports.  Playing sports that involve throwing and overhead movements, including racket sports, gymnastics, weight lifting, or bodybuilding.  Doing physical labor.  Having poor strength and flexibility of the arm and shoulder. What are the signs or symptoms? Symptoms of this condition may include:  Pain and inflammation in the front of the shoulder.  A feeling of warmth in the front of the shoulder.  Limited range of motion of the shoulder and the elbow.  A crackling sound (crepitation) when you move or touch the shoulder or the upper arm. In some cases, symptoms may return after treatment, and they may be long-lasting (chronic). How is this diagnosed? This condition is diagnosed based on:  Your symptoms.  Your medical history.  Physical exam.  X-ray or MRI, if needed. How is this treated? Treatment for this condition depends on the severity of your injury. It may include:  Resting the injured arm.  Icing the injured area.  Doing physical therapy. Your health care provider may also use:  Medicines to treat pain and inflammation.  Sound waves to treat the injured muscle (ultrasound therapy).  Medicines that are injected to the muscle (corticosteroids).  Medicines that numb the area (local anesthetics).  Surgery. This is done if other treatments have not worked. Follow these instructions at home: Managing pain, stiffness, and swelling      If directed, put ice on the  injured area. ? Put ice in a plastic bag. ? Place a towel between your skin and the bag. ? Leave the ice on for 20 minutes, 2-3 times a day.  If directed, apply heat to the affected area before you exercise. Use the heat source that your health care provider recommends, such as a moist heat pack or a heating pad. ? Place a towel between your skin and the heat source. ? Leave the heat  on for 20-30 minutes. ? Remove the heat if your skin turns bright red. This is especially important if you are unable to feel pain, heat, or cold. You may have a greater risk of getting burned.  Move your fingers often to reduce stiffness and swelling.  Raise (elevate) the injured area above the level of your heart while you are lying down. Activity  Do not lift anything that is heavier than 10 lb (4.5 kg), or the limit that you are told, until your health care provider says that it is safe.  Avoid activities that cause pain or make your condition worse.  Return to your normal activities as told by your health care provider. Ask your health care provider what activities are safe for you.  Do exercises as told by your health care provider. General instructions  Take over-the-counter and prescription medicines only as told by your health care provider.  Do not use any products that contain nicotine or tobacco, such as cigarettes, e-cigarettes, and chewing tobacco. These can delay healing. If you need help quitting, ask your health care provider.  Keep all follow-up visits as told by your health care provider. This is important. How is this prevented?  Warm up and stretch before being active.  Cool down and stretch after being active.  Give your body time to rest between periods of activity.  Make sure any equipment that you use is fitted to you.  Be safe and responsible while being active to avoid falls.  Maintain physical fitness, including: ? Strength. ? Flexibility. ? Heart health (cardiovascular fitness). ? The ability to use muscles for a long time (endurance). Contact a health care provider if:  You have symptoms that get worse or do not get better after 2 weeks of treatment.  You develop new symptoms. Get help right away if:  You develop severe pain. Summary  Tendinitis is inflammation of the biceps tendon. Tenosynovitis is inflammation of the lining around the  biceps tendon. These conditions often occur at the same time.  These conditions are usually caused by overusing the shoulder joint and biceps muscle.  Symptoms include pain, warmth in the shoulder, and limited range of motion.  The two conditions are treated with rest, ice, medicines, and surgery (rare). This information is not intended to replace advice given to you by your health care provider. Make sure you discuss any questions you have with your health care provider. Document Revised: 05/03/2018 Document Reviewed: 03/03/2018 Elsevier Patient Education  2020 [employer].   ICE A FEW X  A DAY NEXT FEW WEEKS   OVER THE COUNTER ADVIL OR ALEVE   NO HEAVY LIFTING

## 2019-10-05 NOTE — Progress Notes (Signed)
Chief Complaint  Patient presents with  . Shoulder Pain    around 09/03/19 was working on sheets, and has been hurting ever since.     Chief Complaint  Patient presents with  . Shoulder Pain    around 09/03/19 was working on sheets, and has been hurting ever since.      69 yo F pain anterior right shoulder after reaching behind her; pain at night and some pain reaching over her head       Physical Exam Constitutional:      General: She is not in acute distress.    Appearance: She is well-developed.  Cardiovascular:     Comments: No peripheral edema Skin:    General: Skin is warm and dry.  Neurological:     Mental Status: She is alert and oriented to person, place, and time.     Sensory: No sensory deficit.     Coordination: Coordination normal.     Gait: Gait normal.     Deep Tendon Reflexes: Reflexes are normal and symmetric.  Psychiatric:        Mood and Affect: Mood normal.    Right shoulder is tender over the biceps tendon has full range of motion pain with extension of the shoulder which is referred to the anterior area of the rotator interval pain with supination resistance testing and empty can sign were normal  BP (!) 146/97   Pulse 62   Ht 5\' 4"  (1.626 m)   Wt 275 lb (124.7 kg)   BMI 47.20 kg/m  The patient meets the AMA guidelines for Morbid (severe) obesity with a BMI > 40.0 and I have recommended weight loss.   Encounter Diagnoses  Name Primary?  . Acute pain of right shoulder   . Biceps tendonitis on right Yes  . Body mass index 45.0-49.9, adult ([place])   . Morbid obesity ([place])     Procedure: Biceps tendon injection Right biceps tendon was injected The patient gave verbal consent for cortisone injection Timeout confirmed the site of injection Medications used included 40 mg of Depo-Medrol and 3 mL 1% lidocaine After alcohol and ethyl chloride preparation the point of maximal tenderness was injected over the right biceps tendon there were no  complications  Recommend ice Relative rest No heavy lifting Expect resolution in 10 days to 14 days

## 2019-10-11 ENCOUNTER — Other Ambulatory Visit: Payer: Self-pay

## 2019-10-11 ENCOUNTER — Ambulatory Visit (INDEPENDENT_AMBULATORY_CARE_PROVIDER_SITE_OTHER): Payer: Medicare Other | Admitting: Gastroenterology

## 2019-10-11 ENCOUNTER — Encounter: Payer: Self-pay | Admitting: Gastroenterology

## 2019-10-11 VITALS — BP 125/71 | HR 57 | Temp 97.9°F | Ht 64.0 in | Wt 274.4 lb

## 2019-10-11 DIAGNOSIS — R634 Abnormal weight loss: Secondary | ICD-10-CM | POA: Diagnosis not present

## 2019-10-11 DIAGNOSIS — K2100 Gastro-esophageal reflux disease with esophagitis, without bleeding: Secondary | ICD-10-CM | POA: Diagnosis not present

## 2019-10-11 DIAGNOSIS — K219 Gastro-esophageal reflux disease without esophagitis: Secondary | ICD-10-CM | POA: Insufficient documentation
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
## 2019-10-11 NOTE — Patient Instructions (Signed)
Continue Protonix once daily, 30 minutes before breakfast.   Please call if you continue to lose weight.  We will see you in 4 months!  I enjoyed seeing you again today! As you know, I value our relationship and want to provide genuine, compassionate, and quality care. I welcome your feedback. If you receive a survey regarding your visit,  I greatly appreciate you taking time to fill this out. See you next time!  Annitta Needs, PhD, ANP-BC [facility] Gastroenterology

## 2019-10-17 ENCOUNTER — Ambulatory Visit: Payer: Medicare Other | Admitting: Gastroenterology

## 2019-10-19 DIAGNOSIS — I10 Essential (primary) hypertension: Secondary | ICD-10-CM | POA: Diagnosis not present

## 2019-10-19 DIAGNOSIS — E039 Hypothyroidism, unspecified: Secondary | ICD-10-CM | POA: Diagnosis not present

## 2019-10-19 DIAGNOSIS — E785 Hyperlipidemia, unspecified: Secondary | ICD-10-CM | POA: Diagnosis not present

## 2019-11-13 DIAGNOSIS — S46001A Unspecified injury of muscle(s) and tendon(s) of the rotator cuff of right shoulder, initial encounter: Secondary | ICD-10-CM | POA: Diagnosis not present

## 2019-11-13 DIAGNOSIS — Z6841 Body Mass Index (BMI) 40.0 and over, adult: Secondary | ICD-10-CM | POA: Diagnosis not present

## 2019-11-13 DIAGNOSIS — I10 Essential (primary) hypertension: Secondary | ICD-10-CM | POA: Diagnosis not present

## 2019-11-13 DIAGNOSIS — Z23 Encounter for immunization: Secondary | ICD-10-CM | POA: Diagnosis not present

## 2019-11-18 DIAGNOSIS — E7849 Other hyperlipidemia: Secondary | ICD-10-CM | POA: Diagnosis not present

## 2019-11-18 DIAGNOSIS — E039 Hypothyroidism, unspecified: Secondary | ICD-10-CM | POA: Diagnosis not present

## 2019-11-18 DIAGNOSIS — I10 Essential (primary) hypertension: Secondary | ICD-10-CM | POA: Diagnosis not present

## 2019-11-27 ENCOUNTER — Encounter: Payer: Self-pay | Admitting: Orthopedic Surgery

## 2019-11-27 ENCOUNTER — Other Ambulatory Visit: Payer: Self-pay

## 2019-11-27 ENCOUNTER — Ambulatory Visit (INDEPENDENT_AMBULATORY_CARE_PROVIDER_SITE_OTHER): Payer: Medicare Other | Admitting: Orthopedic Surgery

## 2019-11-27 VITALS — BP 152/86 | HR 69 | Ht 64.0 in | Wt 270.0 lb

## 2019-11-27 DIAGNOSIS — G8929 Other chronic pain: Secondary | ICD-10-CM

## 2019-11-27 DIAGNOSIS — M25511 Pain in right shoulder: Secondary | ICD-10-CM

## 2019-11-27 MED ORDER — TRAMADOL-ACETAMINOPHEN 37.5-325 MG PO TABS: 1.0000 | ORAL_TABLET | Freq: Four times a day (QID) | ORAL | 0 refills | Status: AC | PRN

## 2019-11-27 NOTE — Progress Notes (Signed)
Chief Complaint  Patient presents with  . Shoulder Pain    right shoulder pain, injection did not help and worse since injection   69 year old female been following for several months with right shoulder pain status post injection physical therapy did not improve she is been taking some Aleve and Tylenol but is concerned because of upper GI issues  Pain is located over the right deltoid and radiates up across the shoulder into the neck and is associated decreased range of motion and weakness with forward elevation  Reexamination of the right shoulder shows tenderness over the right deltoid weakness in abduction and flexion primarily supraspinatus tendon normal passive motion with pain and positive impingement 150 degrees  Encounter Diagnosis  Name Primary?  . Chronic right shoulder pain Yes   The patient meets the AMA guidelines for Morbid (severe) obesity with a BMI > 40.0 and I have recommended weight loss.   REC MRI right  SHOULDER   No Known Allergies

## 2019-11-27 NOTE — Patient Instructions (Signed)
Start ultracet for pain every 6 hrs

## 2019-12-07 DIAGNOSIS — Z23 Encounter for immunization: Secondary | ICD-10-CM | POA: Diagnosis not present

## 2019-12-08 ENCOUNTER — Other Ambulatory Visit: Payer: Self-pay

## 2019-12-08 ENCOUNTER — Ambulatory Visit (HOSPITAL_COMMUNITY)
Admission: RE | Admit: 2019-12-08 | Discharge: 2019-12-08 | Disposition: A | Discharge status: Home / Self Care | Payer: Medicare Other | Source: Ambulatory Visit | Attending: Orthopedic Surgery | Admitting: Orthopedic Surgery

## 2019-12-08 DIAGNOSIS — M19011 Primary osteoarthritis, right shoulder: Secondary | ICD-10-CM | POA: Diagnosis not present

## 2019-12-08 DIAGNOSIS — M25511 Pain in right shoulder: Secondary | ICD-10-CM | POA: Diagnosis not present

## 2019-12-08 DIAGNOSIS — M75111 Incomplete rotator cuff tear or rupture of right shoulder, not specified as traumatic: Secondary | ICD-10-CM | POA: Diagnosis not present

## 2019-12-08 DIAGNOSIS — G8929 Other chronic pain: Secondary | ICD-10-CM | POA: Diagnosis not present

## 2019-12-25 ENCOUNTER — Ambulatory Visit (INDEPENDENT_AMBULATORY_CARE_PROVIDER_SITE_OTHER): Payer: Medicare Other | Admitting: Orthopedic Surgery

## 2019-12-25 ENCOUNTER — Encounter: Payer: Self-pay | Admitting: Orthopedic Surgery

## 2019-12-25 ENCOUNTER — Other Ambulatory Visit: Payer: Self-pay

## 2019-12-25 VITALS — BP 123/81 | HR 78 | Ht 64.0 in | Wt 273.0 lb

## 2019-12-25 DIAGNOSIS — M25511 Pain in right shoulder: Secondary | ICD-10-CM | POA: Diagnosis not present

## 2019-12-25 DIAGNOSIS — G8929 Other chronic pain: Secondary | ICD-10-CM

## 2019-12-25 NOTE — Patient Instructions (Signed)
Therapy right shoulder  You have received an injection of steroids into the joint. 15% of patients will have increased pain within the 24 hours postinjection.   This is transient and will go away.   We recommend that you use ice packs on the injection site for 20 minutes every 2 hours and extra strength Tylenol 2 tablets every 8 as needed until the pain resolves.  If you continue to have pain after taking the Tylenol and using the ice please call the office for further instructions.   Do not take nsiads   Bur tylenol 500mg  every 6 hrs and any of the following  These are the muscle and arthrits creams I recommend:  PLEASE READ THE PACKAGE INSTRUCTIONS BEFORE USING   Ben Gay arthritis cream  Icy hot vanishing gel  Aspercreme odor free  Myoflex Oderless pain reliever  Capzasin  Sportscreme  Max freeze

## 2019-12-25 NOTE — Progress Notes (Signed)
  FOLLOW UP VISIT : MRI RESULTS   Chief Complaint  Patient presents with  . Shoulder Pain    right/ review MRI      HPI: The patient is here TO DISCUSS THE RESULTS OF an MRI  HPI 69 year old female was working on some sheets back in August he had a biceps tendon injection, eventually had some home exercise tried some Aleve but GI issues basically relegated to Tylenol because she was not getting better we recommended MRI ROS   BP 123/81 (Cuff Size: Large)   Pulse 78   Ht 5\' 4"  (1.626 m)   Wt 273 lb (123.8 kg)   BMI 46.86 kg/m     Medical decision-making section   DATA  MRI REPORT:  IMPRESSION: 1. Moderate tendinosis of the supraspinatus tendon with a high-grade partial-thickness bursal surface tear anteriorly. 2. Moderate tendinosis of the infraspinatus tendon. 3. Moderate tendinosis of the intra-articular portion of the long head of biceps tendon.   Electronically Signed   By: Kathreen Devoid   On: 12/10/2019 07:20   MY READING: MRI OF THE right shoulder without contrast.  No evidence of full-thickness rotator cuff tear primarily tendinitis and tendinosis supraspinatus infraspinatus intra-articular portion of the long biceps tendon  Images 10 and 11 on T2 fat suppression coronal images best locate the tear   Encounter Diagnosis  Name Primary?  . Chronic right shoulder pain Yes      PLAN:    Surgery will probably not the answer   So we re going to try an injection and formal PT    Procedure note the subacromial injection shoulder RIGHT    Verbal consent was obtained to inject the  RIGHT   Shoulder  Timeout was completed to confirm the injection site is a subacromial space of the  RIGHT  shoulder   Medication used Depo-Medrol 40 mg and lidocaine 1% 3 cc  Anesthesia was provided by ethyl chloride  The injection was performed in the RIGHT  posterior subacromial space. After pinning the skin with alcohol and anesthetized the skin with ethyl chloride  the subacromial space was injected using a 20-gauge needle. There were no complications  Sterile dressing was applied.    Fu 2 months   (chronic, injection , outside film read)

## 2019-12-27 ENCOUNTER — Ambulatory Visit (HOSPITAL_COMMUNITY): Payer: Medicare Other | Attending: Orthopedic Surgery

## 2019-12-27 ENCOUNTER — Encounter (HOSPITAL_COMMUNITY): Payer: Self-pay

## 2019-12-27 ENCOUNTER — Other Ambulatory Visit: Payer: Self-pay

## 2019-12-27 DIAGNOSIS — R29898 Other symptoms and signs involving the musculoskeletal system: Secondary | ICD-10-CM | POA: Diagnosis not present

## 2019-12-27 DIAGNOSIS — M25511 Pain in right shoulder: Secondary | ICD-10-CM | POA: Insufficient documentation
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
## 2019-12-27 NOTE — Patient Instructions (Signed)
Complete the following exercises 1-2 times a day.  Doorway Stretch  Place each hand opposite each other on the doorway. (You can change where you feel the stretch by moving arms higher or lower.) Step through with one foot and bend front knee until a stretch is felt and hold. Step through with the opposite foot on the next rep. Hold for __20-30___ seconds. Repeat __2__times.          Posterior Capsule Stretch   Stand or sit, one arm across body so hand rests over opposite shoulder. Gently push on crossed elbow with other hand until stretch is felt in shoulder of crossed arm. Hold _20-30__ seconds.  Repeat _2__ times per session. Do ___ sessions per day.   Wall Flexion  Slide your arm up the wall or door frame until a stretch is felt in your shoulder . Hold for 20-30 seconds. Complete 2 times     Shoulder Abduction Stretch  Stand side ways by a wall with affected up on wall. Gently step in toward wall to feel stretch. Hold for 20-30 seconds. Complete 2 times.

## 2020-01-03 ENCOUNTER — Other Ambulatory Visit: Payer: Self-pay

## 2020-01-03 ENCOUNTER — Encounter (HOSPITAL_COMMUNITY): Payer: Self-pay | Admitting: Occupational Therapy

## 2020-01-03 ENCOUNTER — Ambulatory Visit (HOSPITAL_COMMUNITY): Payer: Medicare Other | Admitting: Occupational Therapy

## 2020-01-03 DIAGNOSIS — M25511 Pain in right shoulder: Secondary | ICD-10-CM | POA: Diagnosis not present

## 2020-01-03 DIAGNOSIS — R29898 Other symptoms and signs involving the musculoskeletal system: Secondary | ICD-10-CM
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
## 2020-01-03 NOTE — Patient Instructions (Signed)

## 2020-01-10 ENCOUNTER — Encounter (HOSPITAL_COMMUNITY): Payer: Self-pay | Admitting: Occupational Therapy

## 2020-01-10 ENCOUNTER — Other Ambulatory Visit: Payer: Self-pay

## 2020-01-10 ENCOUNTER — Ambulatory Visit (HOSPITAL_COMMUNITY): Payer: Medicare Other | Admitting: Occupational Therapy

## 2020-01-10 DIAGNOSIS — M25511 Pain in right shoulder: Secondary | ICD-10-CM

## 2020-01-10 DIAGNOSIS — R29898 Other symptoms and signs involving the musculoskeletal system: Secondary | ICD-10-CM
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
## 2020-01-17 ENCOUNTER — Ambulatory Visit (HOSPITAL_COMMUNITY): Payer: Medicare Other | Admitting: Occupational Therapy

## 2020-01-17 ENCOUNTER — Encounter (HOSPITAL_COMMUNITY): Payer: Self-pay | Admitting: Occupational Therapy

## 2020-01-17 ENCOUNTER — Other Ambulatory Visit: Payer: Self-pay

## 2020-01-17 DIAGNOSIS — R29898 Other symptoms and signs involving the musculoskeletal system: Secondary | ICD-10-CM | POA: Diagnosis not present

## 2020-01-17 DIAGNOSIS — M25511 Pain in right shoulder: Secondary | ICD-10-CM
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
## 2020-01-17 NOTE — Patient Instructions (Signed)

## 2020-01-24 ENCOUNTER — Other Ambulatory Visit: Payer: Self-pay

## 2020-01-24 ENCOUNTER — Ambulatory Visit (HOSPITAL_COMMUNITY): Payer: Medicare Other | Attending: Orthopedic Surgery

## 2020-01-24 ENCOUNTER — Encounter (HOSPITAL_COMMUNITY): Payer: Self-pay

## 2020-01-24 DIAGNOSIS — R29898 Other symptoms and signs involving the musculoskeletal system: Secondary | ICD-10-CM | POA: Insufficient documentation

## 2020-01-24 DIAGNOSIS — M25511 Pain in right shoulder: Secondary | ICD-10-CM | POA: Diagnosis not present
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
## 2020-02-07 ENCOUNTER — Ambulatory Visit: Payer: Medicare Other | Admitting: Gastroenterology

## 2020-02-07 ENCOUNTER — Other Ambulatory Visit: Payer: Self-pay

## 2020-02-07 ENCOUNTER — Encounter: Payer: Self-pay | Admitting: Gastroenterology

## 2020-02-07 ENCOUNTER — Ambulatory Visit (INDEPENDENT_AMBULATORY_CARE_PROVIDER_SITE_OTHER): Payer: Medicare Other | Admitting: Gastroenterology

## 2020-02-07 VITALS — BP 128/83 | HR 65 | Temp 97.3°F | Ht 64.0 in | Wt 270.4 lb

## 2020-02-07 DIAGNOSIS — K219 Gastro-esophageal reflux disease without esophagitis: Secondary | ICD-10-CM

## 2020-02-07 DIAGNOSIS — I728 Aneurysm of other specified arteries: Secondary | ICD-10-CM

## 2020-02-07 NOTE — Patient Instructions (Signed)
Continue Protonix once daily.  Please call if any further weight loss that is unintentional!  We will do the CT in April to follow-up on the splenic artery aneurysm.  We will see you in 6 months!   I enjoyed seeing you again today! As you know, I value our relationship and want to provide genuine, compassionate, and quality care. I welcome your feedback. If you receive a survey regarding your visit,  I greatly appreciate you taking time to fill this out. See you next time!  Annitta Needs, PhD, ANP-BC [facility]
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
## 2020-02-26 ENCOUNTER — Other Ambulatory Visit: Payer: Self-pay

## 2020-02-26 ENCOUNTER — Encounter: Payer: Self-pay | Admitting: Orthopedic Surgery

## 2020-02-26 ENCOUNTER — Ambulatory Visit (INDEPENDENT_AMBULATORY_CARE_PROVIDER_SITE_OTHER): Payer: Medicare Other | Admitting: Orthopedic Surgery

## 2020-02-26 VITALS — BP 151/69 | HR 68 | Ht 64.0 in | Wt 270.0 lb

## 2020-02-26 DIAGNOSIS — Z6841 Body Mass Index (BMI) 40.0 and over, adult: Secondary | ICD-10-CM

## 2020-02-26 DIAGNOSIS — M25511 Pain in right shoulder: Secondary | ICD-10-CM

## 2020-02-26 DIAGNOSIS — M25562 Pain in left knee: Secondary | ICD-10-CM | POA: Diagnosis not present

## 2020-02-26 DIAGNOSIS — G8929 Other chronic pain: Secondary | ICD-10-CM | POA: Diagnosis not present

## 2020-02-26 MED ORDER — TRAMADOL-ACETAMINOPHEN 37.5-325 MG PO TABS
1.0000 | ORAL_TABLET | ORAL | 0 refills | Status: AC | PRN
Start: 2020-02-26 — End: 2020-03-04

## 2020-02-26 NOTE — Progress Notes (Signed)
Chief Complaint  Patient presents with  . Shoulder Pain    Right feels good overall but has pain at night    Left knee night pain   Right shoulder pain only at night  70 year old female with osteoarthritis left knee and chronic pain right shoulder, MRI was done on the right shoulder 12/10/2019  She only has pain in the shoulder at night and only has pain in the knee at night  Otherwise she is doing well  Physical Exam Constitutional:      General: She is not in acute distress.    Appearance: She is well-developed.     Comments: Well developed, well nourished Normal grooming and hygiene     Cardiovascular:     Comments: No peripheral edema Musculoskeletal:     Comments: Right shoulder full range of motion without weakness  Left knee range of motion is 0-1 10 no instability no swelling  Skin:    General: Skin is warm and dry.  Neurological:     Mental Status: She is alert and oriented to person, place, and time.     Sensory: No sensory deficit.     Coordination: Coordination normal.     Gait: Gait normal.     Deep Tendon Reflexes: Reflexes are normal and symmetric.  Psychiatric:        Mood and Affect: Mood normal.        Behavior: Behavior normal.        Thought Content: Thought content normal.        Judgment: Judgment normal.     Comments: Affect normal      Encounter Diagnoses  Name Primary?  . Chronic right shoulder pain Yes  . Body mass index 45.0-49.9, adult ([place])   . Morbid obesity ([place])   . Chronic pain of left knee     The patient meets the AMA guidelines for Morbid (severe) obesity with a BMI > 40.0 and I have recommended weight loss.   Meds ordered this encounter  Medications  . traMADol-acetaminophen (ULTRACET) 37.5-325 MG tablet    Sig: Take 1 tablet by mouth every 4 (four) hours as needed for up to 7 days.    Dispense:  42 tablet    Refill:  0    Assessment and plan  Patient seems to be doing well with all activities of daily living with  the shoulder and the knee just having some discomfort at night  Instead of continuing to inject recommend Ultracet 1 at night  Follow-up as needed

## 2020-02-28 ENCOUNTER — Emergency Department (HOSPITAL_COMMUNITY): Payer: Medicare Other

## 2020-02-28 ENCOUNTER — Other Ambulatory Visit: Payer: Self-pay

## 2020-02-28 ENCOUNTER — Encounter (HOSPITAL_COMMUNITY): Payer: Self-pay | Admitting: Emergency Medicine

## 2020-02-28 ENCOUNTER — Emergency Department (HOSPITAL_COMMUNITY)
Admission: EM | Admit: 2020-02-28 | Discharge: 2020-02-28 | Disposition: A | Discharge status: Home / Self Care | Payer: Medicare Other | Attending: Emergency Medicine | Admitting: Emergency Medicine

## 2020-02-28 DIAGNOSIS — J189 Pneumonia, unspecified organism: Secondary | ICD-10-CM

## 2020-02-28 DIAGNOSIS — I10 Essential (primary) hypertension: Secondary | ICD-10-CM | POA: Insufficient documentation

## 2020-02-28 DIAGNOSIS — E039 Hypothyroidism, unspecified: Secondary | ICD-10-CM | POA: Diagnosis not present

## 2020-02-28 DIAGNOSIS — Z87891 Personal history of nicotine dependence: Secondary | ICD-10-CM | POA: Diagnosis not present

## 2020-02-28 DIAGNOSIS — Z7982 Long term (current) use of aspirin: Secondary | ICD-10-CM | POA: Insufficient documentation

## 2020-02-28 DIAGNOSIS — Z8543 Personal history of malignant neoplasm of ovary: Secondary | ICD-10-CM | POA: Diagnosis not present

## 2020-02-28 DIAGNOSIS — R0781 Pleurodynia: Secondary | ICD-10-CM | POA: Diagnosis present

## 2020-02-28 DIAGNOSIS — Z20822 Contact with and (suspected) exposure to covid-19: Secondary | ICD-10-CM | POA: Diagnosis not present

## 2020-02-28 DIAGNOSIS — J90 Pleural effusion, not elsewhere classified: Secondary | ICD-10-CM | POA: Diagnosis not present

## 2020-02-28 DIAGNOSIS — R0789 Other chest pain: Secondary | ICD-10-CM | POA: Diagnosis not present

## 2020-02-28 DIAGNOSIS — Z79899 Other long term (current) drug therapy: Secondary | ICD-10-CM | POA: Diagnosis not present

## 2020-02-28 DIAGNOSIS — I728 Aneurysm of other specified arteries: Secondary | ICD-10-CM | POA: Diagnosis not present

## 2020-02-28 DIAGNOSIS — J181 Lobar pneumonia, unspecified organism: Secondary | ICD-10-CM | POA: Insufficient documentation

## 2020-02-28 DIAGNOSIS — R079 Chest pain, unspecified: Secondary | ICD-10-CM | POA: Diagnosis not present

## 2020-02-28 DIAGNOSIS — J9811 Atelectasis: Secondary | ICD-10-CM | POA: Diagnosis not present

## 2020-02-28 LAB — BASIC METABOLIC PANEL
Anion gap: 9 (ref 5–15)
BUN: 20 mg/dL (ref 8–23)
CO2: 28 mmol/L (ref 22–32)
Calcium: 8.5 mg/dL — ABNORMAL LOW (ref 8.9–10.3)
Chloride: 100 mmol/L (ref 98–111)
Creatinine, Ser: 0.59 mg/dL (ref 0.44–1.00)
GFR, Estimated: >60 mL/min (ref >60)
Glucose, Bld: 113 mg/dL — ABNORMAL HIGH (ref 70–99)
Potassium: 3.5 mmol/L (ref 3.5–5.1)
Sodium: 137 mmol/L (ref 135–145)

## 2020-02-28 LAB — CBC WITH DIFFERENTIAL/PLATELET
Abs Immature Granulocytes: 0.01 10*3/uL (ref 0.00–0.07)
Basophils Absolute: 0.1 10*3/uL (ref 0.0–0.1)
Basophils Relative: 1 %
Eosinophils Absolute: 0.3 10*3/uL (ref 0.0–0.5)
Eosinophils Relative: 5 %
HCT: 39.3 % (ref 36.0–46.0)
Hemoglobin: 12.3 g/dL (ref 12.0–15.0)
Immature Granulocytes: 0 %
Lymphocytes Relative: 25 %
Lymphs Abs: 1.6 10*3/uL (ref 0.7–4.0)
MCH: 27.5 pg (ref 26.0–34.0)
MCHC: 31.3 g/dL (ref 30.0–36.0)
MCV: 87.9 fL (ref 80.0–100.0)
Monocytes Absolute: 0.8 10*3/uL (ref 0.1–1.0)
Monocytes Relative: 12 %
Neutro Abs: 3.7 10*3/uL (ref 1.7–7.7)
Neutrophils Relative %: 57 %
Platelets: 227 10*3/uL (ref 150–400)
RBC: 4.47 MIL/uL (ref 3.87–5.11)
RDW: 13.7 % (ref 11.5–15.5)
WBC: 6.4 10*3/uL (ref 4.0–10.5)
nRBC: 0 % (ref 0.0–0.2)

## 2020-02-28 LAB — SARS CORONAVIRUS 2 (TAT 6-24 HRS): SARS Coronavirus 2: NEGATIVE

## 2020-02-28 MED ORDER — FENTANYL CITRATE (PF) 100 MCG/2ML IJ SOLN
100.0000 ug | Freq: Once | INTRAMUSCULAR | Status: AC
  Administered 2020-02-28: 100 ug via INTRAVENOUS
  Filled 2020-02-28: qty 2

## 2020-02-28 MED ORDER — DOXYCYCLINE HYCLATE 100 MG PO TABS
100.0000 mg | ORAL_TABLET | Freq: Once | ORAL | Status: AC
  Administered 2020-02-28: 100 mg via ORAL
  Filled 2020-02-28: qty 1

## 2020-02-28 MED ORDER — AMOXICILLIN-POT CLAVULANATE 875-125 MG PO TABS: 1.0000 | ORAL_TABLET | Freq: Two times a day (BID) | ORAL | 0 refills | Status: AC

## 2020-02-28 MED ORDER — DOXYCYCLINE HYCLATE 100 MG PO CAPS: 100.0000 mg | ORAL_CAPSULE | Freq: Two times a day (BID) | ORAL | 0 refills | Status: AC

## 2020-02-28 MED ORDER — IOHEXOL 350 MG/ML SOLN
100.0000 mL | Freq: Once | INTRAVENOUS | Status: AC | PRN
  Administered 2020-02-28: 100 mL via INTRAVENOUS

## 2020-02-28 MED ORDER — AMOXICILLIN-POT CLAVULANATE 875-125 MG PO TABS
1.0000 | ORAL_TABLET | Freq: Once | ORAL | Status: AC
  Administered 2020-02-28: 1 via ORAL
  Filled 2020-02-28: qty 1

## 2020-02-28 NOTE — ED Notes (Signed)
Pt ambulated around room with no assistance. Pt oxygen 96% RA
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
## 2020-02-28 NOTE — ED Triage Notes (Signed)
Pt states she has pain under R breast that is worse when she takes a deep breath. Pt denies injury. States she took an Ultram @ bedtime but has not experienced any relief.

## 2020-02-28 NOTE — Discharge Instructions (Addendum)
YOU WILL NEED REPEAT CAT SCAN OF YOUR SPLENIC ARTERY TO ENSURE IT IS NOT WORSENING

## 2020-03-13 ENCOUNTER — Other Ambulatory Visit (HOSPITAL_COMMUNITY): Payer: Self-pay | Admitting: Physician Assistant

## 2020-03-13 ENCOUNTER — Ambulatory Visit (HOSPITAL_COMMUNITY)
Admission: RE | Admit: 2020-03-13 | Discharge: 2020-03-13 | Disposition: A | Discharge status: Home / Self Care | Payer: Medicare Other | Source: Ambulatory Visit | Attending: Physician Assistant | Admitting: Physician Assistant

## 2020-03-13 DIAGNOSIS — R0789 Other chest pain: Secondary | ICD-10-CM

## 2020-03-13 DIAGNOSIS — Z6841 Body Mass Index (BMI) 40.0 and over, adult: Secondary | ICD-10-CM | POA: Diagnosis not present

## 2020-03-13 DIAGNOSIS — Z1389 Encounter for screening for other disorder: Secondary | ICD-10-CM | POA: Diagnosis not present

## 2020-03-13 DIAGNOSIS — R079 Chest pain, unspecified: Secondary | ICD-10-CM | POA: Diagnosis not present

## 2020-03-13 DIAGNOSIS — Z1331 Encounter for screening for depression: Secondary | ICD-10-CM | POA: Diagnosis not present

## 2020-03-13 DIAGNOSIS — R1011 Right upper quadrant pain: Secondary | ICD-10-CM | POA: Diagnosis not present

## 2020-03-26 DIAGNOSIS — M1712 Unilateral primary osteoarthritis, left knee: Secondary | ICD-10-CM | POA: Diagnosis not present

## 2020-03-26 DIAGNOSIS — J189 Pneumonia, unspecified organism: Secondary | ICD-10-CM | POA: Diagnosis not present

## 2020-03-26 DIAGNOSIS — Z6841 Body Mass Index (BMI) 40.0 and over, adult: Secondary | ICD-10-CM | POA: Diagnosis not present

## 2020-03-26 DIAGNOSIS — M1991 Primary osteoarthritis, unspecified site: Secondary | ICD-10-CM | POA: Diagnosis not present

## 2020-04-16 ENCOUNTER — Other Ambulatory Visit: Payer: Self-pay | Admitting: Internal Medicine

## 2020-04-17 ENCOUNTER — Telehealth: Payer: Self-pay | Admitting: Internal Medicine

## 2020-04-17 DIAGNOSIS — E785 Hyperlipidemia, unspecified: Secondary | ICD-10-CM | POA: Diagnosis not present

## 2020-04-17 DIAGNOSIS — I10 Essential (primary) hypertension: Secondary | ICD-10-CM | POA: Diagnosis not present

## 2020-04-17 DIAGNOSIS — E039 Hypothyroidism, unspecified: Secondary | ICD-10-CM | POA: Diagnosis not present

## 2020-04-17 NOTE — Telephone Encounter (Signed)
Recall mailed 

## 2020-04-17 NOTE — Telephone Encounter (Signed)
Recall for ct °

## 2020-04-29 ENCOUNTER — Other Ambulatory Visit: Payer: Self-pay

## 2020-04-29 ENCOUNTER — Telehealth: Payer: Self-pay

## 2020-04-29 DIAGNOSIS — I728 Aneurysm of other specified arteries: Secondary | ICD-10-CM

## 2020-04-29 NOTE — Telephone Encounter (Signed)
Pt called office 04/26/20, she received recall letter for CT.  CT abd/pelvis scheduled for 05/24/20 at 5:00pm, arrive at 4:45pm. Pickup contrast prior to test. NPO 4 hours prior to test.  Called and informed pt of CT appt. Letter mailed.

## 2020-05-09 DIAGNOSIS — Z6841 Body Mass Index (BMI) 40.0 and over, adult: Secondary | ICD-10-CM | POA: Diagnosis not present

## 2020-05-09 DIAGNOSIS — E039 Hypothyroidism, unspecified: Secondary | ICD-10-CM | POA: Diagnosis not present

## 2020-05-09 DIAGNOSIS — Z1389 Encounter for screening for other disorder: Secondary | ICD-10-CM | POA: Diagnosis not present

## 2020-05-09 DIAGNOSIS — Z1331 Encounter for screening for depression: Secondary | ICD-10-CM | POA: Diagnosis not present

## 2020-05-09 DIAGNOSIS — E7849 Other hyperlipidemia: Secondary | ICD-10-CM | POA: Diagnosis not present

## 2020-05-09 DIAGNOSIS — E785 Hyperlipidemia, unspecified: Secondary | ICD-10-CM | POA: Diagnosis not present

## 2020-05-09 DIAGNOSIS — Z0001 Encounter for general adult medical examination with abnormal findings: Secondary | ICD-10-CM | POA: Diagnosis not present

## 2020-05-09 DIAGNOSIS — R7309 Other abnormal glucose: Secondary | ICD-10-CM | POA: Diagnosis not present

## 2020-05-09 DIAGNOSIS — M1712 Unilateral primary osteoarthritis, left knee: Secondary | ICD-10-CM | POA: Diagnosis not present

## 2020-05-24 ENCOUNTER — Other Ambulatory Visit: Payer: Self-pay

## 2020-05-24 ENCOUNTER — Ambulatory Visit (HOSPITAL_COMMUNITY)
Admission: RE | Admit: 2020-05-24 | Discharge: 2020-05-24 | Disposition: A | Discharge status: Home / Self Care | Payer: Medicare Other | Source: Ambulatory Visit | Attending: Gastroenterology | Admitting: Gastroenterology

## 2020-05-24 DIAGNOSIS — Z8543 Personal history of malignant neoplasm of ovary: Secondary | ICD-10-CM | POA: Diagnosis not present

## 2020-05-24 DIAGNOSIS — K5730 Diverticulosis of large intestine without perforation or abscess without bleeding: Secondary | ICD-10-CM | POA: Diagnosis not present

## 2020-05-24 DIAGNOSIS — I728 Aneurysm of other specified arteries: Secondary | ICD-10-CM | POA: Diagnosis not present

## 2020-05-24 DIAGNOSIS — C799 Secondary malignant neoplasm of unspecified site: Secondary | ICD-10-CM | POA: Diagnosis not present

## 2020-05-24 LAB — POCT I-STAT CREATININE: Creatinine, Ser: 0.7 mg/dL (ref 0.44–1.00)

## 2020-05-24 MED ORDER — IOHEXOL 300 MG/ML  SOLN
100.0000 mL | Freq: Once | INTRAMUSCULAR | Status: AC | PRN
  Administered 2020-05-24: 100 mL via INTRAVENOUS

## 2020-05-27 ENCOUNTER — Other Ambulatory Visit: Payer: Self-pay

## 2020-05-27 DIAGNOSIS — J90 Pleural effusion, not elsewhere classified: Secondary | ICD-10-CM

## 2020-05-27 DIAGNOSIS — R935 Abnormal findings on diagnostic imaging of other abdominal regions, including retroperitoneum: Secondary | ICD-10-CM

## 2020-06-04 ENCOUNTER — Ambulatory Visit (HOSPITAL_COMMUNITY)
Admission: RE | Admit: 2020-06-04 | Discharge: 2020-06-04 | Disposition: A | Discharge status: Home / Self Care | Payer: Medicare Other | Source: Ambulatory Visit | Attending: Gastroenterology | Admitting: Gastroenterology

## 2020-06-04 ENCOUNTER — Encounter (HOSPITAL_COMMUNITY): Payer: Self-pay | Admitting: Radiology

## 2020-06-04 ENCOUNTER — Other Ambulatory Visit: Payer: Self-pay

## 2020-06-04 DIAGNOSIS — R918 Other nonspecific abnormal finding of lung field: Secondary | ICD-10-CM | POA: Diagnosis not present

## 2020-06-04 DIAGNOSIS — J90 Pleural effusion, not elsewhere classified: Secondary | ICD-10-CM | POA: Insufficient documentation

## 2020-06-04 DIAGNOSIS — R935 Abnormal findings on diagnostic imaging of other abdominal regions, including retroperitoneum: Secondary | ICD-10-CM | POA: Insufficient documentation

## 2020-06-04 DIAGNOSIS — J986 Disorders of diaphragm: Secondary | ICD-10-CM | POA: Diagnosis not present

## 2020-06-04 MED ORDER — IOHEXOL 300 MG/ML  SOLN
75.0000 mL | Freq: Once | INTRAMUSCULAR | Status: AC | PRN
  Administered 2020-06-04: 75 mL via INTRAVENOUS
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
## 2020-06-05 ENCOUNTER — Inpatient Hospital Stay (HOSPITAL_COMMUNITY): Payer: Medicare Other

## 2020-06-05 ENCOUNTER — Inpatient Hospital Stay (HOSPITAL_COMMUNITY): Payer: Medicare Other | Attending: Hematology | Admitting: Hematology

## 2020-06-05 ENCOUNTER — Encounter (HOSPITAL_COMMUNITY): Payer: Self-pay | Admitting: Hematology

## 2020-06-05 VITALS — BP 138/95 | HR 97 | Temp 96.9°F | Resp 18 | Ht 64.0 in | Wt 265.8 lb

## 2020-06-05 DIAGNOSIS — K5900 Constipation, unspecified: Secondary | ICD-10-CM | POA: Insufficient documentation

## 2020-06-05 DIAGNOSIS — Z87891 Personal history of nicotine dependence: Secondary | ICD-10-CM | POA: Diagnosis not present

## 2020-06-05 DIAGNOSIS — Z86010 Personal history of colonic polyps: Secondary | ICD-10-CM | POA: Diagnosis not present

## 2020-06-05 DIAGNOSIS — R160 Hepatomegaly, not elsewhere classified: Secondary | ICD-10-CM | POA: Insufficient documentation

## 2020-06-05 DIAGNOSIS — J910 Malignant pleural effusion: Secondary | ICD-10-CM | POA: Insufficient documentation

## 2020-06-05 DIAGNOSIS — R110 Nausea: Secondary | ICD-10-CM | POA: Diagnosis not present

## 2020-06-05 DIAGNOSIS — G479 Sleep disorder, unspecified: Secondary | ICD-10-CM | POA: Insufficient documentation

## 2020-06-05 DIAGNOSIS — R634 Abnormal weight loss: Secondary | ICD-10-CM | POA: Diagnosis not present

## 2020-06-05 DIAGNOSIS — R5383 Other fatigue: Secondary | ICD-10-CM | POA: Insufficient documentation

## 2020-06-05 DIAGNOSIS — Z8543 Personal history of malignant neoplasm of ovary: Secondary | ICD-10-CM | POA: Insufficient documentation

## 2020-06-05 DIAGNOSIS — Z809 Family history of malignant neoplasm, unspecified: Secondary | ICD-10-CM | POA: Insufficient documentation

## 2020-06-05 DIAGNOSIS — C569 Malignant neoplasm of unspecified ovary: Secondary | ICD-10-CM

## 2020-06-05 DIAGNOSIS — Z90722 Acquired absence of ovaries, bilateral: Secondary | ICD-10-CM | POA: Diagnosis not present

## 2020-06-05 DIAGNOSIS — Z79899 Other long term (current) drug therapy: Secondary | ICD-10-CM | POA: Diagnosis not present

## 2020-06-05 DIAGNOSIS — I700 Atherosclerosis of aorta: Secondary | ICD-10-CM | POA: Diagnosis not present

## 2020-06-05 DIAGNOSIS — Z9049 Acquired absence of other specified parts of digestive tract: Secondary | ICD-10-CM | POA: Diagnosis not present

## 2020-06-05 DIAGNOSIS — K5730 Diverticulosis of large intestine without perforation or abscess without bleeding: Secondary | ICD-10-CM | POA: Diagnosis not present

## 2020-06-05 DIAGNOSIS — R059 Cough, unspecified: Secondary | ICD-10-CM | POA: Insufficient documentation

## 2020-06-05 LAB — COMPREHENSIVE METABOLIC PANEL
ALT: 22 U/L (ref 0–44)
AST: 34 U/L (ref 15–41)
Albumin: 3.5 g/dL (ref 3.5–5.0)
Alkaline Phosphatase: 95 U/L (ref 38–126)
Anion gap: 13 (ref 5–15)
BUN: 21 mg/dL (ref 8–23)
CO2: 25 mmol/L (ref 22–32)
Calcium: 9 mg/dL (ref 8.9–10.3)
Chloride: 98 mmol/L (ref 98–111)
Creatinine, Ser: 1.02 mg/dL — ABNORMAL HIGH (ref 0.44–1.00)
GFR, Estimated: 60 mL/min — ABNORMAL LOW (ref >60)
Glucose, Bld: 138 mg/dL — ABNORMAL HIGH (ref 70–99)
Potassium: 4.1 mmol/L (ref 3.5–5.1)
Sodium: 136 mmol/L (ref 135–145)
Total Bilirubin: 1.2 mg/dL (ref 0.3–1.2)
Total Protein: 7.5 g/dL (ref 6.5–8.1)

## 2020-06-05 LAB — CBC WITH DIFFERENTIAL/PLATELET
Abs Immature Granulocytes: 0.02 10*3/uL (ref 0.00–0.07)
Basophils Absolute: 0.1 10*3/uL (ref 0.0–0.1)
Basophils Relative: 1 %
Eosinophils Absolute: 0.1 10*3/uL (ref 0.0–0.5)
Eosinophils Relative: 1 %
HCT: 39.8 % (ref 36.0–46.0)
Hemoglobin: 12.2 g/dL (ref 12.0–15.0)
Immature Granulocytes: 0 %
Lymphocytes Relative: 20 %
Lymphs Abs: 1.5 10*3/uL (ref 0.7–4.0)
MCH: 26.1 pg (ref 26.0–34.0)
MCHC: 30.7 g/dL (ref 30.0–36.0)
MCV: 85.2 fL (ref 80.0–100.0)
Monocytes Absolute: 0.7 10*3/uL (ref 0.1–1.0)
Monocytes Relative: 10 %
Neutro Abs: 5.1 10*3/uL (ref 1.7–7.7)
Neutrophils Relative %: 68 %
Platelets: 355 10*3/uL (ref 150–400)
RBC: 4.67 MIL/uL (ref 3.87–5.11)
RDW: 14.6 % (ref 11.5–15.5)
WBC: 7.5 10*3/uL (ref 4.0–10.5)
nRBC: 0 % (ref 0.0–0.2)

## 2020-06-05 LAB — MAGNESIUM: Magnesium: 2 mg/dL (ref 1.7–2.4)

## 2020-06-05 MED ORDER — PROCHLORPERAZINE MALEATE 10 MG PO TABS: 10.0000 mg | ORAL_TABLET | Freq: Four times a day (QID) | ORAL | 0 refills | Status: AC | PRN

## 2020-06-05 NOTE — Patient Instructions (Signed)
[place] at [facility] Discharge Instructions  You were seen and examined today by Dr. Delton Coombes. Dr. Delton Coombes is a medical oncologist, meaning he specializes in the management of cancer diagnoses with medications. Dr. Delton Coombes discussed your past medical history, family history of cancer and the events that led to you being here today.  Dr. Delton Coombes reviewed your recent CT scans. This revealed a large spot on your liver that is concerning for cancer. It is unclear if this is related to your previous diagnosis of ovarian cancer. Dr. Delton Coombes has recommended a biopsy of that area to accurately identify what type of cancer is causing it. Dr. Delton Coombes has also recommended a PET scan. A PET scan is a specialized CT scan that illuminates where there is cancer present in your body. Dr. Delton Coombes has also recommended lab work today to check your kidney and liver functions, electrolytes, blood counts and the CA-125 tumor marker (this is specific to ovarian cancer.  Dr. Delton Coombes will see you back following the biopsy and PET scan.   Thank you for choosing [place] at [facility] to provide your oncology and hematology care.  To afford each patient quality time with our provider, please arrive at least 15 minutes before your scheduled appointment time.   If you have a lab appointment with the [facility] please come in thru the Main Entrance and check in at the main information desk.  You need to re-schedule your appointment should you arrive 10 or more minutes late.  We strive to give you quality time with our providers, and arriving late affects you and other patients whose appointments are after yours.  Also, if you no show three or more times for appointments you may be dismissed from the clinic at the providers discretion.     Again, thank you for choosing [facility].  Our hope is that these requests will decrease the amount  of time that you wait before being seen by our physicians.       _____________________________________________________________  Should you have questions after your visit to [facility], please contact our office at [phone] and follow the prompts.  Our office hours are 8:00 a.m. and 4:30 p.m. Monday - Friday.  Please note that voicemails left after 4:00 p.m. may not be returned until the following business day.  We are closed weekends and major holidays.  You do have access to a nurse 24-7, just call the main number to the clinic [phone] and do not press any options, hold on the line and a nurse will answer the phone.    For prescription refill requests, have your pharmacy contact our office and allow 72 hours.    Due to Covid, you will need to wear a mask upon entering the hospital. If you do not have a mask, a mask will be given to you at the Main Entrance upon arrival. For doctor visits, patients may have 1 support person age 69 or older with them. For treatment visits, patients can not have anyone with them due to social distancing guidelines and our immunocompromised population.

## 2020-06-06 LAB — CA 125: Cancer Antigen (CA) 125: 97.7 U/mL — ABNORMAL HIGH (ref 0.0–38.1)

## 2020-06-11 ENCOUNTER — Other Ambulatory Visit: Payer: Self-pay

## 2020-06-11 ENCOUNTER — Ambulatory Visit (HOSPITAL_COMMUNITY)
Admission: RE | Admit: 2020-06-11 | Discharge: 2020-06-11 | Disposition: A | Discharge status: Home / Self Care | Payer: Medicare Other | Source: Ambulatory Visit | Attending: Hematology | Admitting: Hematology

## 2020-06-11 DIAGNOSIS — C787 Secondary malignant neoplasm of liver and intrahepatic bile duct: Secondary | ICD-10-CM | POA: Insufficient documentation

## 2020-06-11 DIAGNOSIS — R911 Solitary pulmonary nodule: Secondary | ICD-10-CM | POA: Diagnosis not present

## 2020-06-11 DIAGNOSIS — R160 Hepatomegaly, not elsewhere classified: Secondary | ICD-10-CM | POA: Diagnosis not present

## 2020-06-11 DIAGNOSIS — I700 Atherosclerosis of aorta: Secondary | ICD-10-CM | POA: Diagnosis not present

## 2020-06-11 DIAGNOSIS — C569 Malignant neoplasm of unspecified ovary: Secondary | ICD-10-CM | POA: Insufficient documentation

## 2020-06-11 DIAGNOSIS — J90 Pleural effusion, not elsewhere classified: Secondary | ICD-10-CM | POA: Diagnosis not present

## 2020-06-11 DIAGNOSIS — I2510 Atherosclerotic heart disease of native coronary artery without angina pectoris: Secondary | ICD-10-CM | POA: Diagnosis not present

## 2020-06-11 LAB — GLUCOSE, CAPILLARY: Glucose-Capillary: 111 mg/dL — ABNORMAL HIGH (ref 70–99)

## 2020-06-11 MED ORDER — FLUDEOXYGLUCOSE F - 18 (FDG) INJECTION
13.7000 | Freq: Once | INTRAVENOUS | Status: AC | PRN
  Administered 2020-06-11: 14.21 via INTRAVENOUS

## 2020-06-12 ENCOUNTER — Encounter (HOSPITAL_COMMUNITY): Payer: Self-pay | Admitting: Radiology

## 2020-06-12 NOTE — Progress Notes (Signed)
Annette Clark Female, 70 y.o., March 09, 1950  MRN:  [id number] Phone:  [phone] (H) ...       PCP:  Sharilyn Sites, MD Primary Cvg:  Medicare/Medicare Part A And B  Next Appt With Oncology 06/26/2020 at 2:30 PM           US BIOPSY Received: Today [name], [name]  [name], [name], RT  [name],   You can put on 6/1 at 12:30/11am arrival per [facility] in [place].   Thanks        Previous Messages   ----- Message -----  From: Suzette Battiest, MD  Sent: 06/12/2020 11:07 AM EDT  To: [name], *  Subject: RE: CT Biopsy                   Approved for ultrasound guided liver mass biospy. Contrast enhanced ultrasound would be beneficial for biopsy guidance - recommend scheduling at [facility] on June 1 if possible.   [name]  ----- Message -----  From: [name]  Sent: 06/12/2020 10:30 AM EDT  To: Suzette Battiest, MD  Subject: RE: CT Biopsy                   NM PET scan has been completed  ----- Message -----  From: Suzette Battiest, MD  Sent: 06/05/2020 11:55 AM EDT  To: [name]  Subject: RE: CT Biopsy                   Recommend re-evaluation after PET completed.    [name]  ----- Message -----  From: [name]  Sent: 06/05/2020 10:55 AM EDT  To: [name]  Subject: CT Biopsy                     Procedure: CT Biopsy   Reason: Liver mass  Malignant neoplasm of ovary, unspecified laterality, liver mass with history of ovarian cancer, please biopsy liver biopsy   History: CT in computer, NM PET scheduled for 06/11/20   Provider: [name]   Provider Contact: [phone]

## 2020-06-13 ENCOUNTER — Other Ambulatory Visit: Payer: Self-pay | Admitting: *Deleted

## 2020-06-13 DIAGNOSIS — J90 Pleural effusion, not elsewhere classified: Secondary | ICD-10-CM

## 2020-06-14 ENCOUNTER — Other Ambulatory Visit: Payer: Self-pay | Admitting: *Deleted

## 2020-06-14 ENCOUNTER — Encounter (HOSPITAL_COMMUNITY): Payer: Self-pay

## 2020-06-14 ENCOUNTER — Other Ambulatory Visit (HOSPITAL_COMMUNITY): Payer: Self-pay | Admitting: Diagnostic Radiology

## 2020-06-14 ENCOUNTER — Other Ambulatory Visit: Payer: Self-pay

## 2020-06-14 ENCOUNTER — Ambulatory Visit (HOSPITAL_COMMUNITY): Admission: RE | Admit: 2020-06-14 | Payer: Medicare Other | Source: Ambulatory Visit

## 2020-06-14 ENCOUNTER — Ambulatory Visit (HOSPITAL_COMMUNITY)
Admission: RE | Admit: 2020-06-14 | Discharge: 2020-06-14 | Disposition: A | Discharge status: Home / Self Care | Payer: Medicare Other | Source: Ambulatory Visit | Attending: Diagnostic Radiology | Admitting: Diagnostic Radiology

## 2020-06-14 ENCOUNTER — Ambulatory Visit (HOSPITAL_COMMUNITY)
Admission: RE | Admit: 2020-06-14 | Discharge: 2020-06-14 | Disposition: A | Discharge status: Home / Self Care | Payer: Medicare Other | Source: Ambulatory Visit | Attending: Gastroenterology | Admitting: Gastroenterology

## 2020-06-14 DIAGNOSIS — J910 Malignant pleural effusion: Secondary | ICD-10-CM | POA: Diagnosis not present

## 2020-06-14 DIAGNOSIS — J90 Pleural effusion, not elsewhere classified: Secondary | ICD-10-CM

## 2020-06-14 DIAGNOSIS — J948 Other specified pleural conditions: Secondary | ICD-10-CM | POA: Diagnosis not present

## 2020-06-14 DIAGNOSIS — I517 Cardiomegaly: Secondary | ICD-10-CM | POA: Diagnosis not present

## 2020-06-14 DIAGNOSIS — C801 Malignant (primary) neoplasm, unspecified: Secondary | ICD-10-CM | POA: Diagnosis not present

## 2020-06-14 DIAGNOSIS — Z9889 Other specified postprocedural states: Secondary | ICD-10-CM

## 2020-06-14 DIAGNOSIS — Z8543 Personal history of malignant neoplasm of ovary: Secondary | ICD-10-CM | POA: Insufficient documentation

## 2020-06-14 DIAGNOSIS — J9811 Atelectasis: Secondary | ICD-10-CM | POA: Diagnosis not present

## 2020-06-14 LAB — PROTEIN, PLEURAL OR PERITONEAL FLUID: Total protein, fluid: 4 g/dL

## 2020-06-14 LAB — LACTATE DEHYDROGENASE, PLEURAL OR PERITONEAL FLUID: LD, Fluid: 1719 U/L — ABNORMAL HIGH (ref 3–23)

## 2020-06-14 MED ORDER — LIDOCAINE HCL (PF) 1 % IJ SOLN
INTRAMUSCULAR | Status: AC
  Administered 2020-06-14: 10 mL via SUBCUTANEOUS
  Filled 2020-06-14: qty 10

## 2020-06-14 MED ORDER — LIDOCAINE HCL (PF) 2 % IJ SOLN
INTRAMUSCULAR | Status: AC
  Filled 2020-06-14: qty 10

## 2020-06-14 NOTE — Procedures (Signed)
PreOperative Dx: RIGHT pleural effusion Postoperative Dx: RIGHT pleural effusion Procedure:   US guided RIGHT thoracentesis Radiologist:  Thornton Papas Anesthesia:  10 ml of 1% lidocaine Specimen:  380 mL of serosanguinous colored fluid EBL:   < 1 ml Complications: None

## 2020-06-17 ENCOUNTER — Encounter (HOSPITAL_COMMUNITY): Payer: Self-pay | Admitting: Emergency Medicine

## 2020-06-17 ENCOUNTER — Other Ambulatory Visit: Payer: Self-pay

## 2020-06-17 ENCOUNTER — Inpatient Hospital Stay (HOSPITAL_COMMUNITY)
Admission: EM | Admit: 2020-06-17 | Discharge: 2020-07-19 | DRG: 871 | Disposition: E | Payer: Medicare Other | Attending: Internal Medicine | Admitting: Internal Medicine

## 2020-06-17 ENCOUNTER — Emergency Department (HOSPITAL_COMMUNITY): Payer: Medicare Other

## 2020-06-17 DIAGNOSIS — R6521 Severe sepsis with septic shock: Secondary | ICD-10-CM | POA: Diagnosis present

## 2020-06-17 DIAGNOSIS — Z7982 Long term (current) use of aspirin: Secondary | ICD-10-CM

## 2020-06-17 DIAGNOSIS — Z809 Family history of malignant neoplasm, unspecified: Secondary | ICD-10-CM

## 2020-06-17 DIAGNOSIS — Z20822 Contact with and (suspected) exposure to covid-19: Secondary | ICD-10-CM | POA: Diagnosis not present

## 2020-06-17 DIAGNOSIS — M79604 Pain in right leg: Secondary | ICD-10-CM | POA: Diagnosis present

## 2020-06-17 DIAGNOSIS — R188 Other ascites: Secondary | ICD-10-CM | POA: Diagnosis present

## 2020-06-17 DIAGNOSIS — J9690 Respiratory failure, unspecified, unspecified whether with hypoxia or hypercapnia: Secondary | ICD-10-CM | POA: Diagnosis not present

## 2020-06-17 DIAGNOSIS — Z79899 Other long term (current) drug therapy: Secondary | ICD-10-CM

## 2020-06-17 DIAGNOSIS — J9811 Atelectasis: Secondary | ICD-10-CM | POA: Diagnosis not present

## 2020-06-17 DIAGNOSIS — R0902 Hypoxemia: Secondary | ICD-10-CM | POA: Diagnosis not present

## 2020-06-17 DIAGNOSIS — R109 Unspecified abdominal pain: Secondary | ICD-10-CM | POA: Diagnosis present

## 2020-06-17 DIAGNOSIS — R531 Weakness: Secondary | ICD-10-CM | POA: Diagnosis not present

## 2020-06-17 DIAGNOSIS — K5730 Diverticulosis of large intestine without perforation or abscess without bleeding: Secondary | ICD-10-CM | POA: Diagnosis not present

## 2020-06-17 DIAGNOSIS — Z66 Do not resuscitate: Secondary | ICD-10-CM | POA: Diagnosis not present

## 2020-06-17 DIAGNOSIS — Z8543 Personal history of malignant neoplasm of ovary: Secondary | ICD-10-CM

## 2020-06-17 DIAGNOSIS — R160 Hepatomegaly, not elsewhere classified: Secondary | ICD-10-CM | POA: Diagnosis not present

## 2020-06-17 DIAGNOSIS — N19 Unspecified kidney failure: Secondary | ICD-10-CM | POA: Diagnosis not present

## 2020-06-17 DIAGNOSIS — R627 Adult failure to thrive: Secondary | ICD-10-CM | POA: Diagnosis present

## 2020-06-17 DIAGNOSIS — R0689 Other abnormalities of breathing: Secondary | ICD-10-CM | POA: Diagnosis not present

## 2020-06-17 DIAGNOSIS — I824Z3 Acute embolism and thrombosis of unspecified deep veins of distal lower extremity, bilateral: Secondary | ICD-10-CM

## 2020-06-17 DIAGNOSIS — N179 Acute kidney failure, unspecified: Secondary | ICD-10-CM | POA: Diagnosis not present

## 2020-06-17 DIAGNOSIS — E860 Dehydration: Secondary | ICD-10-CM | POA: Diagnosis present

## 2020-06-17 DIAGNOSIS — I82431 Acute embolism and thrombosis of right popliteal vein: Secondary | ICD-10-CM | POA: Diagnosis present

## 2020-06-17 DIAGNOSIS — Z452 Encounter for adjustment and management of vascular access device: Secondary | ICD-10-CM

## 2020-06-17 DIAGNOSIS — K219 Gastro-esophageal reflux disease without esophagitis: Secondary | ICD-10-CM | POA: Diagnosis present

## 2020-06-17 DIAGNOSIS — A419 Sepsis, unspecified organism: Principal | ICD-10-CM | POA: Diagnosis present

## 2020-06-17 DIAGNOSIS — J90 Pleural effusion, not elsewhere classified: Secondary | ICD-10-CM | POA: Diagnosis not present

## 2020-06-17 DIAGNOSIS — Z01818 Encounter for other preprocedural examination: Secondary | ICD-10-CM

## 2020-06-17 DIAGNOSIS — Z90710 Acquired absence of both cervix and uterus: Secondary | ICD-10-CM

## 2020-06-17 DIAGNOSIS — Z515 Encounter for palliative care: Secondary | ICD-10-CM | POA: Diagnosis not present

## 2020-06-17 DIAGNOSIS — I10 Essential (primary) hypertension: Secondary | ICD-10-CM | POA: Diagnosis present

## 2020-06-17 DIAGNOSIS — C7989 Secondary malignant neoplasm of other specified sites: Secondary | ICD-10-CM | POA: Diagnosis present

## 2020-06-17 DIAGNOSIS — R579 Shock, unspecified: Secondary | ICD-10-CM | POA: Diagnosis present

## 2020-06-17 DIAGNOSIS — E039 Hypothyroidism, unspecified: Secondary | ICD-10-CM | POA: Diagnosis not present

## 2020-06-17 DIAGNOSIS — E875 Hyperkalemia: Secondary | ICD-10-CM | POA: Diagnosis present

## 2020-06-17 DIAGNOSIS — Z79890 Hormone replacement therapy (postmenopausal): Secondary | ICD-10-CM

## 2020-06-17 DIAGNOSIS — I81 Portal vein thrombosis: Secondary | ICD-10-CM

## 2020-06-17 DIAGNOSIS — Z6841 Body Mass Index (BMI) 40.0 and over, adult: Secondary | ICD-10-CM

## 2020-06-17 DIAGNOSIS — Z791 Long term (current) use of non-steroidal anti-inflammatories (NSAID): Secondary | ICD-10-CM

## 2020-06-17 DIAGNOSIS — N170 Acute kidney failure with tubular necrosis: Secondary | ICD-10-CM | POA: Diagnosis not present

## 2020-06-17 DIAGNOSIS — I824Y1 Acute embolism and thrombosis of unspecified deep veins of right proximal lower extremity: Secondary | ICD-10-CM | POA: Diagnosis present

## 2020-06-17 DIAGNOSIS — C569 Malignant neoplasm of unspecified ovary: Secondary | ICD-10-CM | POA: Diagnosis present

## 2020-06-17 DIAGNOSIS — J910 Malignant pleural effusion: Secondary | ICD-10-CM | POA: Diagnosis present

## 2020-06-17 DIAGNOSIS — Z87891 Personal history of nicotine dependence: Secondary | ICD-10-CM

## 2020-06-17 DIAGNOSIS — E871 Hypo-osmolality and hyponatremia: Secondary | ICD-10-CM | POA: Diagnosis present

## 2020-06-17 DIAGNOSIS — Z86711 Personal history of pulmonary embolism: Secondary | ICD-10-CM

## 2020-06-17 DIAGNOSIS — I824Z1 Acute embolism and thrombosis of unspecified deep veins of right distal lower extremity: Secondary | ICD-10-CM | POA: Diagnosis present

## 2020-06-17 DIAGNOSIS — R911 Solitary pulmonary nodule: Secondary | ICD-10-CM | POA: Diagnosis present

## 2020-06-17 DIAGNOSIS — E872 Acidosis: Secondary | ICD-10-CM | POA: Diagnosis present

## 2020-06-17 DIAGNOSIS — C787 Secondary malignant neoplasm of liver and intrahepatic bile duct: Secondary | ICD-10-CM | POA: Diagnosis present

## 2020-06-17 DIAGNOSIS — I82411 Acute embolism and thrombosis of right femoral vein: Secondary | ICD-10-CM | POA: Diagnosis present

## 2020-06-17 DIAGNOSIS — Z9289 Personal history of other medical treatment: Secondary | ICD-10-CM

## 2020-06-17 LAB — BASIC METABOLIC PANEL
Anion gap: 14 (ref 5–15)
BUN: 97 mg/dL — ABNORMAL HIGH (ref 8–23)
CO2: 21 mmol/L — ABNORMAL LOW (ref 22–32)
Calcium: 8.4 mg/dL — ABNORMAL LOW (ref 8.9–10.3)
Chloride: 96 mmol/L — ABNORMAL LOW (ref 98–111)
Creatinine, Ser: 5.98 mg/dL — ABNORMAL HIGH (ref 0.44–1.00)
GFR, Estimated: 7 mL/min — ABNORMAL LOW (ref >60)
Glucose, Bld: 87 mg/dL (ref 70–99)
Potassium: 4.8 mmol/L (ref 3.5–5.1)
Sodium: 131 mmol/L — ABNORMAL LOW (ref 135–145)

## 2020-06-17 LAB — CBC WITH DIFFERENTIAL/PLATELET
Abs Immature Granulocytes: 0.08 10*3/uL — ABNORMAL HIGH (ref 0.00–0.07)
Basophils Absolute: 0.1 10*3/uL (ref 0.0–0.1)
Basophils Relative: 1 %
Eosinophils Absolute: 0 10*3/uL (ref 0.0–0.5)
Eosinophils Relative: 0 %
HCT: 35.9 % — ABNORMAL LOW (ref 36.0–46.0)
Hemoglobin: 11 g/dL — ABNORMAL LOW (ref 12.0–15.0)
Immature Granulocytes: 1 %
Lymphocytes Relative: 13 %
Lymphs Abs: 1.3 10*3/uL (ref 0.7–4.0)
MCH: 25.2 pg — ABNORMAL LOW (ref 26.0–34.0)
MCHC: 30.6 g/dL (ref 30.0–36.0)
MCV: 82.3 fL (ref 80.0–100.0)
Monocytes Absolute: 0.9 10*3/uL (ref 0.1–1.0)
Monocytes Relative: 9 %
Neutro Abs: 7.9 10*3/uL — ABNORMAL HIGH (ref 1.7–7.7)
Neutrophils Relative %: 76 %
Platelets: 179 10*3/uL (ref 150–400)
RBC: 4.36 MIL/uL (ref 3.87–5.11)
RDW: 15.5 % (ref 11.5–15.5)
WBC: 10.3 10*3/uL (ref 4.0–10.5)
nRBC: 0.7 % — ABNORMAL HIGH (ref 0.0–0.2)

## 2020-06-17 LAB — COMPREHENSIVE METABOLIC PANEL
ALT: 44 U/L (ref 0–44)
ALT: 42 U/L (ref 0–44)
AST: 69 U/L — ABNORMAL HIGH (ref 15–41)
AST: 70 U/L — ABNORMAL HIGH (ref 15–41)
Albumin: 3.1 g/dL — ABNORMAL LOW (ref 3.5–5.0)
Albumin: 3.2 g/dL — ABNORMAL LOW (ref 3.5–5.0)
Alkaline Phosphatase: 111 U/L (ref 38–126)
Alkaline Phosphatase: 110 U/L (ref 38–126)
Anion gap: 14 (ref 5–15)
Anion gap: 13 (ref 5–15)
BUN: 95 mg/dL — ABNORMAL HIGH (ref 8–23)
BUN: 95 mg/dL — ABNORMAL HIGH (ref 8–23)
CO2: 20 mmol/L — ABNORMAL LOW (ref 22–32)
CO2: 21 mmol/L — ABNORMAL LOW (ref 22–32)
Calcium: 8.5 mg/dL — ABNORMAL LOW (ref 8.9–10.3)
Calcium: 9 mg/dL (ref 8.9–10.3)
Chloride: 97 mmol/L — ABNORMAL LOW (ref 98–111)
Chloride: 96 mmol/L — ABNORMAL LOW (ref 98–111)
Creatinine, Ser: 5.87 mg/dL — ABNORMAL HIGH (ref 0.44–1.00)
Creatinine, Ser: 5.87 mg/dL — ABNORMAL HIGH (ref 0.44–1.00)
GFR, Estimated: 7 mL/min — ABNORMAL LOW (ref >60)
GFR, Estimated: 7 mL/min — ABNORMAL LOW (ref >60)
Glucose, Bld: 102 mg/dL — ABNORMAL HIGH (ref 70–99)
Glucose, Bld: 86 mg/dL (ref 70–99)
Potassium: 5.9 mmol/L — ABNORMAL HIGH (ref 3.5–5.1)
Potassium: 5 mmol/L (ref 3.5–5.1)
Sodium: 131 mmol/L — ABNORMAL LOW (ref 135–145)
Sodium: 130 mmol/L — ABNORMAL LOW (ref 135–145)
Total Bilirubin: 1.2 mg/dL (ref 0.3–1.2)
Total Bilirubin: 1.4 mg/dL — ABNORMAL HIGH (ref 0.3–1.2)
Total Protein: 7.2 g/dL (ref 6.5–8.1)
Total Protein: 7.7 g/dL (ref 6.5–8.1)

## 2020-06-17 LAB — PROTIME-INR
INR: 1.3 — ABNORMAL HIGH (ref 0.8–1.2)
Prothrombin Time: 16 seconds — ABNORMAL HIGH (ref 11.4–15.2)

## 2020-06-17 LAB — RESP PANEL BY RT-PCR (FLU A&B, COVID) ARPGX2
Influenza A by PCR: NEGATIVE
Influenza B by PCR: NEGATIVE
SARS Coronavirus 2 by RT PCR: NEGATIVE

## 2020-06-17 LAB — HIV ANTIBODY (ROUTINE TESTING W REFLEX): HIV Screen 4th Generation wRfx: NONREACTIVE

## 2020-06-17 LAB — BRAIN NATRIURETIC PEPTIDE: B Natriuretic Peptide: 71 pg/mL (ref 0.0–100.0)

## 2020-06-17 LAB — LIPASE, BLOOD: Lipase: 38 U/L (ref 11–51)

## 2020-06-17 LAB — LACTIC ACID, PLASMA
Lactic Acid, Venous: 2.6 mmol/L (ref 0.5–1.9)
Lactic Acid, Venous: 2.9 mmol/L (ref 0.5–1.9)

## 2020-06-17 LAB — GLUCOSE, RANDOM: Glucose, Bld: 194 mg/dL — ABNORMAL HIGH (ref 70–99)

## 2020-06-17 LAB — CBG MONITORING, ED: Glucose-Capillary: 128 mg/dL — ABNORMAL HIGH (ref 70–99)

## 2020-06-17 MED ORDER — LACTATED RINGERS IV SOLN: INTRAVENOUS | Status: DC

## 2020-06-17 MED ORDER — SODIUM CHLORIDE 0.9 % IV SOLN: 250.0000 mL | INTRAVENOUS | Status: DC

## 2020-06-17 MED ORDER — LACTATED RINGERS IV BOLUS
1000.0000 mL | Freq: Once | INTRAVENOUS | Status: AC
  Administered 2020-06-17: 1000 mL via INTRAVENOUS

## 2020-06-17 MED ORDER — SODIUM CHLORIDE 0.9 % IV SOLN: 250.0000 mL | INTRAVENOUS | Status: DC | PRN

## 2020-06-17 MED ORDER — ACETAMINOPHEN 650 MG RE SUPP: 650.0000 mg | Freq: Four times a day (QID) | RECTAL | Status: DC | PRN

## 2020-06-17 MED ORDER — HEPARIN SODIUM (PORCINE) 5000 UNIT/ML IJ SOLN
5000.0000 [IU] | Freq: Three times a day (TID) | INTRAMUSCULAR | Status: DC
  Administered 2020-06-17 – 2020-06-18 (×3): 5000 [IU] via SUBCUTANEOUS
  Filled 2020-06-17 (×3): qty 1

## 2020-06-17 MED ORDER — SENNOSIDES-DOCUSATE SODIUM 8.6-50 MG PO TABS: 1.0000 | ORAL_TABLET | Freq: Every evening | ORAL | Status: DC | PRN

## 2020-06-17 MED ORDER — PROCHLORPERAZINE MALEATE 10 MG PO TABS
10.0000 mg | ORAL_TABLET | Freq: Four times a day (QID) | ORAL | Status: DC | PRN
  Filled 2020-06-17 (×2): qty 1

## 2020-06-17 MED ORDER — SODIUM CHLORIDE 0.9 % IV SOLN: INTRAVENOUS | Status: DC

## 2020-06-17 MED ORDER — ONDANSETRON HCL 4 MG PO TABS: 4.0000 mg | ORAL_TABLET | Freq: Four times a day (QID) | ORAL | Status: DC | PRN

## 2020-06-17 MED ORDER — SODIUM CHLORIDE 0.9 % IV BOLUS
500.0000 mL | Freq: Once | INTRAVENOUS | Status: AC
  Administered 2020-06-17: 500 mL via INTRAVENOUS

## 2020-06-17 MED ORDER — NOREPINEPHRINE 4 MG/250ML-% IV SOLN: 0.0000 ug/min | INTRAVENOUS | Status: DC

## 2020-06-17 MED ORDER — BISACODYL 5 MG PO TBEC: 5.0000 mg | DELAYED_RELEASE_TABLET | Freq: Every day | ORAL | Status: DC | PRN

## 2020-06-17 MED ORDER — DEXTROSE 10 % IV SOLN: Freq: Once | INTRAVENOUS | Status: AC

## 2020-06-17 MED ORDER — LEVOTHYROXINE SODIUM 25 MCG PO TABS
25.0000 ug | ORAL_TABLET | Freq: Every day | ORAL | Status: DC
  Administered 2020-06-18 – 2020-06-19 (×2): 25 ug via ORAL
  Filled 2020-06-17 (×2): qty 1

## 2020-06-17 MED ORDER — HYDROMORPHONE HCL 1 MG/ML IJ SOLN
0.5000 mg | INTRAMUSCULAR | Status: DC | PRN
  Administered 2020-06-18 (×3): 0.5 mg via INTRAVENOUS
  Filled 2020-06-17 (×2): qty 1

## 2020-06-17 MED ORDER — OXYCODONE HCL 5 MG PO TABS: 5.0000 mg | ORAL_TABLET | ORAL | Status: DC | PRN

## 2020-06-17 MED ORDER — CHLORHEXIDINE GLUCONATE CLOTH 2 % EX PADS
6.0000 | MEDICATED_PAD | Freq: Every day | CUTANEOUS | Status: DC
  Administered 2020-06-18: 6 via TOPICAL

## 2020-06-17 MED ORDER — SODIUM CHLORIDE 0.9% FLUSH
3.0000 mL | Freq: Two times a day (BID) | INTRAVENOUS | Status: DC
  Administered 2020-06-18: 3 mL via INTRAVENOUS

## 2020-06-17 MED ORDER — IPRATROPIUM BROMIDE 0.02 % IN SOLN: 0.5000 mg | Freq: Four times a day (QID) | RESPIRATORY_TRACT | Status: DC | PRN

## 2020-06-17 MED ORDER — HYDRALAZINE HCL 20 MG/ML IJ SOLN: 10.0000 mg | INTRAMUSCULAR | Status: DC | PRN

## 2020-06-17 MED ORDER — ONDANSETRON HCL 4 MG/2ML IJ SOLN
4.0000 mg | Freq: Four times a day (QID) | INTRAMUSCULAR | Status: DC | PRN
  Administered 2020-06-17 – 2020-06-18 (×2): 4 mg via INTRAVENOUS
  Filled 2020-06-17 (×2): qty 2

## 2020-06-17 MED ORDER — MAGNESIUM CITRATE PO SOLN
1.0000 | Freq: Once | ORAL | Status: DC | PRN
  Filled 2020-06-17: qty 296

## 2020-06-17 MED ORDER — SODIUM CHLORIDE 0.9 % IV BOLUS
1000.0000 mL | Freq: Once | INTRAVENOUS | Status: AC
  Administered 2020-06-17: 1000 mL via INTRAVENOUS

## 2020-06-17 MED ORDER — DEXTROSE 50 % IV SOLN
1.0000 | Freq: Once | INTRAVENOUS | Status: AC
  Administered 2020-06-17: 50 mL via INTRAVENOUS
  Filled 2020-06-17: qty 50

## 2020-06-17 MED ORDER — LEVALBUTEROL HCL 0.63 MG/3ML IN NEBU: 0.6300 mg | INHALATION_SOLUTION | Freq: Four times a day (QID) | RESPIRATORY_TRACT | Status: DC | PRN

## 2020-06-17 MED ORDER — SODIUM ZIRCONIUM CYCLOSILICATE 5 G PO PACK
5.0000 g | PACK | Freq: Every day | ORAL | Status: DC
  Administered 2020-06-17: 5 g via ORAL
  Filled 2020-06-17: qty 1

## 2020-06-17 MED ORDER — SODIUM CHLORIDE 0.9% FLUSH: 3.0000 mL | INTRAVENOUS | Status: DC | PRN

## 2020-06-17 MED ORDER — ACETAMINOPHEN 325 MG PO TABS: 650.0000 mg | ORAL_TABLET | Freq: Four times a day (QID) | ORAL | Status: DC | PRN

## 2020-06-17 MED ORDER — CALCIUM GLUCONATE 10 % IV SOLN
1.0000 g | Freq: Once | INTRAVENOUS | Status: AC
  Administered 2020-06-17: 1 g via INTRAVENOUS
  Filled 2020-06-17: qty 10

## 2020-06-17 MED ORDER — NOREPINEPHRINE 4 MG/250ML-% IV SOLN
2.0000 ug/min | INTRAVENOUS | Status: DC
  Administered 2020-06-17: 4 ug/min via INTRAVENOUS
  Administered 2020-06-18: 10 ug/min via INTRAVENOUS
  Filled 2020-06-17 (×2): qty 250

## 2020-06-17 MED ORDER — INSULIN ASPART 100 UNIT/ML IV SOLN
5.0000 [IU] | Freq: Once | INTRAVENOUS | Status: AC
  Administered 2020-06-17: 5 [IU] via INTRAVENOUS

## 2020-06-17 MED ORDER — TRAZODONE HCL 50 MG PO TABS: 25.0000 mg | ORAL_TABLET | Freq: Every evening | ORAL | Status: DC | PRN

## 2020-06-17 NOTE — ED Notes (Signed)
Ppt put on cardiac monitor

## 2020-06-17 NOTE — ED Notes (Signed)
Date and time results received: 06/16/2020 1210 (use smartphrase ".now" to insert current time)  Test: Lactic Acid Critical Value: 2.6  Name of Provider Notified: Long  Orders Received? Or Actions Taken?:

## 2020-06-17 NOTE — ED Triage Notes (Signed)
Pt from home via [facility]. Pt reports sudden onset right leg pain. Ems states pts right foot was discolored but did have a pulse. Pt reporting the pain is better at this time.
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
## 2020-06-18 ENCOUNTER — Other Ambulatory Visit: Payer: Self-pay

## 2020-06-18 ENCOUNTER — Inpatient Hospital Stay (HOSPITAL_COMMUNITY): Payer: Medicare Other

## 2020-06-18 ENCOUNTER — Encounter (HOSPITAL_COMMUNITY): Payer: Medicare Other

## 2020-06-18 ENCOUNTER — Observation Stay (HOSPITAL_COMMUNITY): Payer: Medicare Other

## 2020-06-18 ENCOUNTER — Encounter (HOSPITAL_COMMUNITY): Payer: Self-pay | Admitting: Family Medicine

## 2020-06-18 DIAGNOSIS — Z452 Encounter for adjustment and management of vascular access device: Secondary | ICD-10-CM | POA: Diagnosis not present

## 2020-06-18 DIAGNOSIS — C787 Secondary malignant neoplasm of liver and intrahepatic bile duct: Secondary | ICD-10-CM

## 2020-06-18 DIAGNOSIS — R579 Shock, unspecified: Secondary | ICD-10-CM | POA: Diagnosis present

## 2020-06-18 DIAGNOSIS — I824Y1 Acute embolism and thrombosis of unspecified deep veins of right proximal lower extremity: Secondary | ICD-10-CM | POA: Diagnosis present

## 2020-06-18 DIAGNOSIS — Z6841 Body Mass Index (BMI) 40.0 and over, adult: Secondary | ICD-10-CM | POA: Diagnosis not present

## 2020-06-18 DIAGNOSIS — R160 Hepatomegaly, not elsewhere classified: Secondary | ICD-10-CM | POA: Diagnosis not present

## 2020-06-18 DIAGNOSIS — I82431 Acute embolism and thrombosis of right popliteal vein: Secondary | ICD-10-CM | POA: Diagnosis present

## 2020-06-18 DIAGNOSIS — Z66 Do not resuscitate: Secondary | ICD-10-CM | POA: Diagnosis not present

## 2020-06-18 DIAGNOSIS — J9690 Respiratory failure, unspecified, unspecified whether with hypoxia or hypercapnia: Secondary | ICD-10-CM | POA: Diagnosis present

## 2020-06-18 DIAGNOSIS — I959 Hypotension, unspecified: Secondary | ICD-10-CM | POA: Diagnosis not present

## 2020-06-18 DIAGNOSIS — E871 Hypo-osmolality and hyponatremia: Secondary | ICD-10-CM | POA: Diagnosis not present

## 2020-06-18 DIAGNOSIS — Z7189 Other specified counseling: Secondary | ICD-10-CM | POA: Diagnosis not present

## 2020-06-18 DIAGNOSIS — I824Z3 Acute embolism and thrombosis of unspecified deep veins of distal lower extremity, bilateral: Secondary | ICD-10-CM | POA: Diagnosis not present

## 2020-06-18 DIAGNOSIS — E875 Hyperkalemia: Secondary | ICD-10-CM | POA: Diagnosis not present

## 2020-06-18 DIAGNOSIS — M7989 Other specified soft tissue disorders: Secondary | ICD-10-CM | POA: Diagnosis not present

## 2020-06-18 DIAGNOSIS — I824Z1 Acute embolism and thrombosis of unspecified deep veins of right distal lower extremity: Secondary | ICD-10-CM | POA: Diagnosis not present

## 2020-06-18 DIAGNOSIS — N179 Acute kidney failure, unspecified: Secondary | ICD-10-CM | POA: Diagnosis not present

## 2020-06-18 DIAGNOSIS — J910 Malignant pleural effusion: Secondary | ICD-10-CM | POA: Diagnosis present

## 2020-06-18 DIAGNOSIS — R188 Other ascites: Secondary | ICD-10-CM | POA: Diagnosis present

## 2020-06-18 DIAGNOSIS — Z515 Encounter for palliative care: Secondary | ICD-10-CM

## 2020-06-18 DIAGNOSIS — N170 Acute kidney failure with tubular necrosis: Secondary | ICD-10-CM | POA: Diagnosis present

## 2020-06-18 DIAGNOSIS — A419 Sepsis, unspecified organism: Secondary | ICD-10-CM | POA: Diagnosis present

## 2020-06-18 DIAGNOSIS — J986 Disorders of diaphragm: Secondary | ICD-10-CM | POA: Diagnosis not present

## 2020-06-18 DIAGNOSIS — I428 Other cardiomyopathies: Secondary | ICD-10-CM

## 2020-06-18 DIAGNOSIS — E860 Dehydration: Secondary | ICD-10-CM | POA: Diagnosis present

## 2020-06-18 DIAGNOSIS — R6521 Severe sepsis with septic shock: Secondary | ICD-10-CM | POA: Diagnosis present

## 2020-06-18 DIAGNOSIS — M79604 Pain in right leg: Secondary | ICD-10-CM

## 2020-06-18 DIAGNOSIS — J9811 Atelectasis: Secondary | ICD-10-CM | POA: Diagnosis not present

## 2020-06-18 DIAGNOSIS — M79661 Pain in right lower leg: Secondary | ICD-10-CM | POA: Diagnosis not present

## 2020-06-18 DIAGNOSIS — I82411 Acute embolism and thrombosis of right femoral vein: Secondary | ICD-10-CM | POA: Diagnosis not present

## 2020-06-18 DIAGNOSIS — Z20822 Contact with and (suspected) exposure to covid-19: Secondary | ICD-10-CM | POA: Diagnosis present

## 2020-06-18 DIAGNOSIS — J984 Other disorders of lung: Secondary | ICD-10-CM | POA: Diagnosis not present

## 2020-06-18 DIAGNOSIS — I10 Essential (primary) hypertension: Secondary | ICD-10-CM | POA: Diagnosis not present

## 2020-06-18 DIAGNOSIS — I81 Portal vein thrombosis: Secondary | ICD-10-CM | POA: Diagnosis not present

## 2020-06-18 DIAGNOSIS — Z4682 Encounter for fitting and adjustment of non-vascular catheter: Secondary | ICD-10-CM | POA: Diagnosis not present

## 2020-06-18 DIAGNOSIS — J90 Pleural effusion, not elsewhere classified: Secondary | ICD-10-CM | POA: Diagnosis not present

## 2020-06-18 DIAGNOSIS — R110 Nausea: Secondary | ICD-10-CM | POA: Diagnosis not present

## 2020-06-18 DIAGNOSIS — E039 Hypothyroidism, unspecified: Secondary | ICD-10-CM | POA: Diagnosis not present

## 2020-06-18 DIAGNOSIS — E872 Acidosis: Secondary | ICD-10-CM | POA: Diagnosis present

## 2020-06-18 DIAGNOSIS — C7989 Secondary malignant neoplasm of other specified sites: Secondary | ICD-10-CM | POA: Diagnosis present

## 2020-06-18 LAB — ECHOCARDIOGRAM LIMITED
Height: 64 in
Weight: 4642.01 oz

## 2020-06-18 LAB — POCT I-STAT 7, (LYTES, BLD GAS, ICA,H+H)
Acid-base deficit: 8 mmol/L — ABNORMAL HIGH (ref 0.0–2.0)
Acid-base deficit: 11 mmol/L — ABNORMAL HIGH (ref 0.0–2.0)
Acid-base deficit: 14 mmol/L — ABNORMAL HIGH (ref 0.0–2.0)
Bicarbonate: 19.1 mmol/L — ABNORMAL LOW (ref 20.0–28.0)
Bicarbonate: 16.7 mmol/L — ABNORMAL LOW (ref 20.0–28.0)
Bicarbonate: 14.4 mmol/L — ABNORMAL LOW (ref 20.0–28.0)
Calcium, Ion: 1.03 mmol/L — ABNORMAL LOW (ref 1.15–1.40)
Calcium, Ion: 0.99 mmol/L — ABNORMAL LOW (ref 1.15–1.40)
Calcium, Ion: 1.03 mmol/L — ABNORMAL LOW (ref 1.15–1.40)
HCT: 35 % — ABNORMAL LOW (ref 36.0–46.0)
HCT: 40 % (ref 36.0–46.0)
HCT: 34 % — ABNORMAL LOW (ref 36.0–46.0)
Hemoglobin: 11.9 g/dL — ABNORMAL LOW (ref 12.0–15.0)
Hemoglobin: 13.6 g/dL (ref 12.0–15.0)
Hemoglobin: 11.6 g/dL — ABNORMAL LOW (ref 12.0–15.0)
O2 Saturation: 100 %
O2 Saturation: 100 %
O2 Saturation: 99 %
Patient temperature: 37
Patient temperature: 97.8
Patient temperature: 94
Potassium: 6.1 mmol/L — ABNORMAL HIGH (ref 3.5–5.1)
Potassium: 5.3 mmol/L — ABNORMAL HIGH (ref 3.5–5.1)
Potassium: 5.3 mmol/L — ABNORMAL HIGH (ref 3.5–5.1)
Sodium: 130 mmol/L — ABNORMAL LOW (ref 135–145)
Sodium: 131 mmol/L — ABNORMAL LOW (ref 135–145)
Sodium: 135 mmol/L (ref 135–145)
TCO2: 20 mmol/L — ABNORMAL LOW (ref 22–32)
TCO2: 18 mmol/L — ABNORMAL LOW (ref 22–32)
TCO2: 16 mmol/L — ABNORMAL LOW (ref 22–32)
pCO2 arterial: 45.3 mmHg (ref 32.0–48.0)
pCO2 arterial: 42.5 mmHg (ref 32.0–48.0)
pCO2 arterial: 36.9 mmHg (ref 32.0–48.0)
pH, Arterial: 7.234 — ABNORMAL LOW (ref 7.350–7.450)
pH, Arterial: 7.2 — ABNORMAL LOW (ref 7.350–7.450)
pH, Arterial: 7.183 — CL (ref 7.350–7.450)
pO2, Arterial: 349 mmHg — ABNORMAL HIGH (ref 83.0–108.0)
pO2, Arterial: 399 mmHg — ABNORMAL HIGH (ref 83.0–108.0)
pO2, Arterial: 145 mmHg — ABNORMAL HIGH (ref 83.0–108.0)

## 2020-06-18 LAB — BASIC METABOLIC PANEL
Anion gap: 12 (ref 5–15)
Anion gap: 15 (ref 5–15)
BUN: 100 mg/dL — ABNORMAL HIGH (ref 8–23)
BUN: 102 mg/dL — ABNORMAL HIGH (ref 8–23)
CO2: 19 mmol/L — ABNORMAL LOW (ref 22–32)
CO2: 15 mmol/L — ABNORMAL LOW (ref 22–32)
Calcium: 8 mg/dL — ABNORMAL LOW (ref 8.9–10.3)
Calcium: 8.3 mg/dL — ABNORMAL LOW (ref 8.9–10.3)
Chloride: 99 mmol/L (ref 98–111)
Chloride: 99 mmol/L (ref 98–111)
Creatinine, Ser: 6.12 mg/dL — ABNORMAL HIGH (ref 0.44–1.00)
Creatinine, Ser: 6.23 mg/dL — ABNORMAL HIGH (ref 0.44–1.00)
GFR, Estimated: 7 mL/min — ABNORMAL LOW (ref >60)
GFR, Estimated: 7 mL/min — ABNORMAL LOW (ref >60)
Glucose, Bld: 116 mg/dL — ABNORMAL HIGH (ref 70–99)
Glucose, Bld: 114 mg/dL — ABNORMAL HIGH (ref 70–99)
Potassium: 6 mmol/L — ABNORMAL HIGH (ref 3.5–5.1)
Potassium: 5.4 mmol/L — ABNORMAL HIGH (ref 3.5–5.1)
Sodium: 130 mmol/L — ABNORMAL LOW (ref 135–145)
Sodium: 129 mmol/L — ABNORMAL LOW (ref 135–145)

## 2020-06-18 LAB — CBC
HCT: 34.5 % — ABNORMAL LOW (ref 36.0–46.0)
HCT: 33.6 % — ABNORMAL LOW (ref 36.0–46.0)
Hemoglobin: 10.5 g/dL — ABNORMAL LOW (ref 12.0–15.0)
Hemoglobin: 10.1 g/dL — ABNORMAL LOW (ref 12.0–15.0)
MCH: 24.9 pg — ABNORMAL LOW (ref 26.0–34.0)
MCH: 24.8 pg — ABNORMAL LOW (ref 26.0–34.0)
MCHC: 30.4 g/dL (ref 30.0–36.0)
MCHC: 30.1 g/dL (ref 30.0–36.0)
MCV: 81.9 fL (ref 80.0–100.0)
MCV: 82.6 fL (ref 80.0–100.0)
Platelets: 203 10*3/uL (ref 150–400)
Platelets: 203 10*3/uL (ref 150–400)
RBC: 4.21 MIL/uL (ref 3.87–5.11)
RBC: 4.07 MIL/uL (ref 3.87–5.11)
RDW: 15.7 % — ABNORMAL HIGH (ref 11.5–15.5)
RDW: 15.9 % — ABNORMAL HIGH (ref 11.5–15.5)
WBC: 9.9 10*3/uL (ref 4.0–10.5)
WBC: 16.8 10*3/uL — ABNORMAL HIGH (ref 4.0–10.5)
nRBC: 1.2 % — ABNORMAL HIGH (ref 0.0–0.2)
nRBC: 1.7 % — ABNORMAL HIGH (ref 0.0–0.2)

## 2020-06-18 LAB — MRSA PCR SCREENING: MRSA by PCR: POSITIVE — AB

## 2020-06-18 LAB — RENAL FUNCTION PANEL
Albumin: 2.8 g/dL — ABNORMAL LOW (ref 3.5–5.0)
Albumin: 3.3 g/dL — ABNORMAL LOW (ref 3.5–5.0)
Anion gap: 18 — ABNORMAL HIGH (ref 5–15)
Anion gap: 20 — ABNORMAL HIGH (ref 5–15)
BUN: 93 mg/dL — ABNORMAL HIGH (ref 8–23)
BUN: 75 mg/dL — ABNORMAL HIGH (ref 8–23)
CO2: 17 mmol/L — ABNORMAL LOW (ref 22–32)
CO2: 14 mmol/L — ABNORMAL LOW (ref 22–32)
Calcium: 8.3 mg/dL — ABNORMAL LOW (ref 8.9–10.3)
Calcium: 8.2 mg/dL — ABNORMAL LOW (ref 8.9–10.3)
Chloride: 95 mmol/L — ABNORMAL LOW (ref 98–111)
Chloride: 99 mmol/L (ref 98–111)
Creatinine, Ser: 6.41 mg/dL — ABNORMAL HIGH (ref 0.44–1.00)
Creatinine, Ser: 5.21 mg/dL — ABNORMAL HIGH (ref 0.44–1.00)
GFR, Estimated: 7 mL/min — ABNORMAL LOW (ref >60)
GFR, Estimated: 8 mL/min — ABNORMAL LOW (ref >60)
Glucose, Bld: 147 mg/dL — ABNORMAL HIGH (ref 70–99)
Glucose, Bld: 133 mg/dL — ABNORMAL HIGH (ref 70–99)
Phosphorus: 8.3 mg/dL — ABNORMAL HIGH (ref 2.5–4.6)
Phosphorus: 7.3 mg/dL — ABNORMAL HIGH (ref 2.5–4.6)
Potassium: 6.2 mmol/L — ABNORMAL HIGH (ref 3.5–5.1)
Potassium: 5.3 mmol/L — ABNORMAL HIGH (ref 3.5–5.1)
Sodium: 130 mmol/L — ABNORMAL LOW (ref 135–145)
Sodium: 133 mmol/L — ABNORMAL LOW (ref 135–145)

## 2020-06-18 LAB — PROTIME-INR
INR: 1.3 — ABNORMAL HIGH (ref 0.8–1.2)
Prothrombin Time: 15.8 seconds — ABNORMAL HIGH (ref 11.4–15.2)

## 2020-06-18 LAB — CK: Total CK: 53 U/L (ref 38–234)

## 2020-06-18 LAB — URINALYSIS, ROUTINE W REFLEX MICROSCOPIC
Bilirubin Urine: NEGATIVE
Glucose, UA: 50 mg/dL — AB
Hgb urine dipstick: NEGATIVE
Ketones, ur: 5 mg/dL — AB
Leukocytes,Ua: NEGATIVE
Nitrite: NEGATIVE
Protein, ur: >=300 mg/dL — AB
Specific Gravity, Urine: 1.025 (ref 1.005–1.030)
pH: 5 (ref 5.0–8.0)

## 2020-06-18 LAB — CYTOLOGY - NON PAP

## 2020-06-18 LAB — MAGNESIUM: Magnesium: 2.4 mg/dL (ref 1.7–2.4)

## 2020-06-18 LAB — GLUCOSE, CAPILLARY
Glucose-Capillary: 145 mg/dL — ABNORMAL HIGH (ref 70–99)
Glucose-Capillary: 126 mg/dL — ABNORMAL HIGH (ref 70–99)
Glucose-Capillary: 119 mg/dL — ABNORMAL HIGH (ref 70–99)

## 2020-06-18 LAB — LACTIC ACID, PLASMA
Lactic Acid, Venous: 5.4 mmol/L (ref 0.5–1.9)
Lactic Acid, Venous: 8.4 mmol/L (ref 0.5–1.9)

## 2020-06-18 LAB — PROCALCITONIN: Procalcitonin: 1.06 ng/mL

## 2020-06-18 MED ORDER — HEPARIN BOLUS VIA INFUSION
5500.0000 [IU] | Freq: Once | INTRAVENOUS | Status: AC
  Administered 2020-06-18: 5500 [IU] via INTRAVENOUS
  Filled 2020-06-18: qty 5500

## 2020-06-18 MED ORDER — INSULIN ASPART 100 UNIT/ML IJ SOLN: 0.0000 [IU] | INTRAMUSCULAR | Status: DC

## 2020-06-18 MED ORDER — HYDROCORTISONE NA SUCCINATE PF 100 MG IJ SOLR
100.0000 mg | Freq: Two times a day (BID) | INTRAMUSCULAR | Status: DC
  Administered 2020-06-18 – 2020-06-19 (×2): 100 mg via INTRAVENOUS
  Filled 2020-06-18 (×2): qty 2

## 2020-06-18 MED ORDER — ALBUMIN HUMAN 5 % IV SOLN
INTRAVENOUS | Status: AC
  Filled 2020-06-18: qty 250

## 2020-06-18 MED ORDER — EPINEPHRINE HCL 5 MG/250ML IV SOLN IN NS
0.5000 ug/min | INTRAVENOUS | Status: DC
  Administered 2020-06-18: 0.5 ug/min via INTRAVENOUS
  Administered 2020-06-19 (×3): 20 ug/min via INTRAVENOUS
  Filled 2020-06-18 (×5): qty 250

## 2020-06-18 MED ORDER — CALCIUM GLUCONATE 10 % IV SOLN: 2.0000 g | Freq: Once | INTRAVENOUS | Status: DC

## 2020-06-18 MED ORDER — VANCOMYCIN HCL 10 G IV SOLR
2500.0000 mg | Freq: Once | INTRAVENOUS | Status: AC
  Administered 2020-06-18: 2500 mg via INTRAVENOUS
  Filled 2020-06-18: qty 2500

## 2020-06-18 MED ORDER — ARTIFICIAL TEARS OPHTHALMIC OINT
TOPICAL_OINTMENT | Freq: Three times a day (TID) | OPHTHALMIC | Status: DC
  Administered 2020-06-19: 1 via OPHTHALMIC
  Filled 2020-06-18: qty 3.5

## 2020-06-18 MED ORDER — SODIUM ZIRCONIUM CYCLOSILICATE 10 G PO PACK
10.0000 g | PACK | Freq: Two times a day (BID) | ORAL | Status: AC
  Administered 2020-06-18: 10 g via ORAL
  Filled 2020-06-18 (×2): qty 1

## 2020-06-18 MED ORDER — METOCLOPRAMIDE HCL 5 MG/ML IJ SOLN: 10.0000 mg | Freq: Four times a day (QID) | INTRAMUSCULAR | Status: DC | PRN

## 2020-06-18 MED ORDER — PRISMASOL BGK 0/2.5 32-2.5 MEQ/L EC SOLN
Status: DC
  Filled 2020-06-18 (×4): qty 5000

## 2020-06-18 MED ORDER — MUPIROCIN 2 % EX OINT
TOPICAL_OINTMENT | Freq: Two times a day (BID) | CUTANEOUS | Status: DC
  Filled 2020-06-18: qty 22

## 2020-06-18 MED ORDER — CALCIUM GLUCONATE-NACL 1-0.675 GM/50ML-% IV SOLN
1.0000 g | Freq: Once | INTRAVENOUS | Status: AC
  Administered 2020-06-18: 1000 mg via INTRAVENOUS
  Filled 2020-06-18: qty 50

## 2020-06-18 MED ORDER — PRISMASOL BGK 4/2.5 32-4-2.5 MEQ/L EC SOLN: Status: DC

## 2020-06-18 MED ORDER — PHENYLEPHRINE CONCENTRATED 100MG/250ML (0.4 MG/ML) INFUSION SIMPLE
0.0000 ug/min | INTRAVENOUS | Status: DC
  Administered 2020-06-18: 200 ug/min via INTRAVENOUS
  Administered 2020-06-19 (×2): 400 ug/min via INTRAVENOUS
  Filled 2020-06-18 (×6): qty 250

## 2020-06-18 MED ORDER — LACTATED RINGERS IV BOLUS
1000.0000 mL | Freq: Once | INTRAVENOUS | Status: AC
  Administered 2020-06-18: 1000 mL via INTRAVENOUS

## 2020-06-18 MED ORDER — DEXTROSE 10 % IV SOLN: Freq: Once | INTRAVENOUS | Status: AC

## 2020-06-18 MED ORDER — INSULIN ASPART 100 UNIT/ML IV SOLN
5.0000 [IU] | Freq: Once | INTRAVENOUS | Status: AC
  Administered 2020-06-18: 5 [IU] via INTRAVENOUS

## 2020-06-18 MED ORDER — SODIUM CHLORIDE 0.9 % IV SOLN
12.5000 mg | Freq: Once | INTRAVENOUS | Status: DC
  Filled 2020-06-18 (×2): qty 0.5

## 2020-06-18 MED ORDER — SODIUM CHLORIDE 0.9 % IV SOLN: 25.0000 mg | INTRAVENOUS | Status: DC

## 2020-06-18 MED ORDER — SODIUM CHLORIDE 0.9 % IV SOLN
1.0000 mg/kg/h | INTRAVENOUS | Status: DC
  Administered 2020-06-18 – 2020-06-19 (×5): 1 mg/kg/h via INTRAVENOUS
  Filled 2020-06-18 (×8): qty 5

## 2020-06-18 MED ORDER — PHENYLEPHRINE HCL-NACL 10-0.9 MG/250ML-% IV SOLN
0.0000 ug/min | INTRAVENOUS | Status: DC
  Administered 2020-06-18: 80 ug/min via INTRAVENOUS
  Filled 2020-06-18 (×2): qty 250

## 2020-06-18 MED ORDER — SODIUM CHLORIDE 0.9 % IV SOLN: 0.5000 mg/kg/h | INTRAVENOUS | Status: DC

## 2020-06-18 MED ORDER — CALCIUM GLUCONATE-NACL 1-0.675 GM/50ML-% IV SOLN
INTRAVENOUS | Status: AC
  Filled 2020-06-18: qty 50

## 2020-06-18 MED ORDER — FENTANYL 2500MCG IN NS 250ML (10MCG/ML) PREMIX INFUSION
0.0000 ug/h | INTRAVENOUS | Status: DC
  Administered 2020-06-18: 100 ug/h via INTRAVENOUS
  Administered 2020-06-19: 175 ug/h via INTRAVENOUS
  Filled 2020-06-18 (×2): qty 250

## 2020-06-18 MED ORDER — PHENYLEPHRINE 40 MCG/ML (10ML) SYRINGE FOR IV PUSH (FOR BLOOD PRESSURE SUPPORT)
40.0000 ug | PREFILLED_SYRINGE | Freq: Once | INTRAVENOUS | Status: AC
  Administered 2020-06-18: 40 ug via INTRAVENOUS

## 2020-06-18 MED ORDER — SODIUM BICARBONATE 8.4 % IV SOLN
100.0000 meq | Freq: Once | INTRAVENOUS | Status: AC
  Administered 2020-06-18: 100 meq via INTRAVENOUS
  Filled 2020-06-18: qty 100

## 2020-06-18 MED ORDER — FENTANYL CITRATE (PF) 100 MCG/2ML IJ SOLN
INTRAMUSCULAR | Status: AC
  Administered 2020-06-18: 100 ug
  Filled 2020-06-18: qty 2

## 2020-06-18 MED ORDER — CALCIUM GLUCONATE 10 % IV SOLN: 1.0000 g | Freq: Once | INTRAVENOUS | Status: DC

## 2020-06-18 MED ORDER — KETAMINE HCL 50 MG/5ML IJ SOSY
PREFILLED_SYRINGE | INTRAMUSCULAR | Status: AC
  Filled 2020-06-18: qty 5

## 2020-06-18 MED ORDER — FENTANYL BOLUS VIA INFUSION
25.0000 ug | INTRAVENOUS | Status: DC | PRN
  Administered 2020-06-18 (×2): 100 ug via INTRAVENOUS
  Filled 2020-06-18: qty 100

## 2020-06-18 MED ORDER — HEPARIN (PORCINE) 25000 UT/250ML-% IV SOLN
1450.0000 [IU]/h | INTRAVENOUS | Status: DC
  Administered 2020-06-18: 1450 [IU]/h via INTRAVENOUS
  Filled 2020-06-18: qty 250

## 2020-06-18 MED ORDER — DEXTROSE 50 % IV SOLN
1.0000 | Freq: Once | INTRAVENOUS | Status: AC
  Administered 2020-06-18: 50 mL via INTRAVENOUS
  Filled 2020-06-18: qty 50

## 2020-06-18 MED ORDER — SODIUM BICARBONATE 8.4 % IV SOLN
INTRAVENOUS | Status: AC
  Administered 2020-06-18: 100 meq via INTRAVENOUS
  Filled 2020-06-18: qty 100

## 2020-06-18 MED ORDER — SODIUM BICARBONATE 8.4 % IV SOLN: 100.0000 meq | Freq: Once | INTRAVENOUS | Status: AC

## 2020-06-18 MED ORDER — NOREPINEPHRINE 16 MG/250ML-% IV SOLN
0.0000 ug/min | INTRAVENOUS | Status: DC
  Administered 2020-06-18: 70 ug/min via INTRAVENOUS
  Administered 2020-06-18: 17 ug/min via INTRAVENOUS
  Administered 2020-06-19 (×3): 70 ug/min via INTRAVENOUS
  Filled 2020-06-18 (×6): qty 250

## 2020-06-18 MED ORDER — MUPIROCIN 2 % EX OINT
1.0000 "application " | TOPICAL_OINTMENT | Freq: Two times a day (BID) | CUTANEOUS | Status: DC
  Administered 2020-06-18 – 2020-06-19 (×3): 1 via NASAL

## 2020-06-18 MED ORDER — SODIUM CHLORIDE 0.9 % IV SOLN
2.0000 g | Freq: Two times a day (BID) | INTRAVENOUS | Status: DC
  Administered 2020-06-18: 2 g via INTRAVENOUS
  Filled 2020-06-18: qty 2

## 2020-06-18 MED ORDER — ALBUMIN HUMAN 5 % IV SOLN
25.0000 g | Freq: Once | INTRAVENOUS | Status: AC
  Administered 2020-06-18: 25 g via INTRAVENOUS
  Filled 2020-06-18: qty 500

## 2020-06-18 MED ORDER — VANCOMYCIN HCL 1250 MG/250ML IV SOLN: 1250.0000 mg | INTRAVENOUS | Status: DC

## 2020-06-18 MED ORDER — MIDAZOLAM HCL 2 MG/2ML IJ SOLN
INTRAMUSCULAR | Status: AC
  Administered 2020-06-18: 2 mg
  Filled 2020-06-18: qty 2

## 2020-06-18 MED ORDER — KETAMINE BOLUS VIA INFUSION
0.1000 mg/kg | Freq: Once | INTRAVENOUS | Status: AC
  Administered 2020-06-18: 13.16 mg via INTRAVENOUS
  Filled 2020-06-18: qty 15

## 2020-06-18 MED ORDER — CALCIUM GLUCONATE-NACL 1-0.675 GM/50ML-% IV SOLN: 1.0000 g | Freq: Once | INTRAVENOUS | Status: DC

## 2020-06-18 MED ORDER — PRISMASOL BGK 0/2.5 32-2.5 MEQ/L EC SOLN
Status: DC
  Filled 2020-06-18 (×2): qty 5000

## 2020-06-18 MED ORDER — EPINEPHRINE 1 MG/10ML IJ SOSY
PREFILLED_SYRINGE | INTRAMUSCULAR | Status: AC
  Filled 2020-06-18: qty 30

## 2020-06-18 MED ORDER — HEPARIN SODIUM (PORCINE) 1000 UNIT/ML DIALYSIS
1000.0000 [IU] | INTRAMUSCULAR | Status: DC | PRN
  Filled 2020-06-18: qty 6

## 2020-06-18 MED ORDER — CHLORHEXIDINE GLUCONATE CLOTH 2 % EX PADS: 6.0000 | MEDICATED_PAD | Freq: Every day | CUTANEOUS | Status: DC

## 2020-06-18 MED ORDER — STERILE WATER FOR INJECTION IV SOLN
INTRAVENOUS | Status: DC
  Filled 2020-06-18 (×5): qty 1000

## 2020-06-18 MED ORDER — CALCIUM GLUCONATE-NACL 2-0.675 GM/100ML-% IV SOLN
2.0000 g | Freq: Once | INTRAVENOUS | Status: DC
  Filled 2020-06-18: qty 100

## 2020-06-18 MED ORDER — ROCURONIUM BROMIDE 10 MG/ML (PF) SYRINGE
PREFILLED_SYRINGE | INTRAVENOUS | Status: AC
  Filled 2020-06-18: qty 10

## 2020-06-18 MED ORDER — FUROSEMIDE 10 MG/ML IJ SOLN
40.0000 mg | Freq: Once | INTRAMUSCULAR | Status: AC
  Administered 2020-06-18: 40 mg via INTRAVENOUS
  Filled 2020-06-18: qty 4

## 2020-06-18 MED ORDER — HEPARIN (PORCINE) 2000 UNITS/L FOR CRRT
INTRAVENOUS_CENTRAL | Status: DC | PRN
  Filled 2020-06-18 (×2): qty 1000

## 2020-06-18 MED ORDER — VASOPRESSIN 20 UNITS/100 ML INFUSION FOR SHOCK
0.0400 [IU]/min | INTRAVENOUS | Status: DC
  Administered 2020-06-18 – 2020-06-19 (×3): 0.04 [IU]/min via INTRAVENOUS
  Filled 2020-06-18 (×3): qty 100

## 2020-06-18 MED ORDER — ETOMIDATE 2 MG/ML IV SOLN
INTRAVENOUS | Status: AC
  Administered 2020-06-18: 10 mg
  Filled 2020-06-18: qty 20

## 2020-06-18 NOTE — Progress Notes (Signed)
[place] for heparin Indication: DVT  No Known Allergies  Patient Measurements: Height: 5\' 4"  (162.6 cm) Weight: 131.6 kg (290 lb 2 oz) IBW/kg (Calculated) : 54.7 Heparin Dosing Weight: 85kg  Vital Signs: Temp: 97.8 F (36.6 C) (05/31 1325) Temp Source: Oral (05/31 1325) BP: 66/46 (05/31 1410) Pulse Rate: 84 (05/31 1424)  Labs: Recent Labs    06/07/2020 1119 06/18/2020 1441 06/05/2020 1544 06/18/20 0428 06/18/20 0930  HGB 11.0*  --   --  10.5*  --   HCT 35.9*  --   --  34.5*  --   PLT 179  --   --  203  --   LABPROT 16.0*  --   --  15.8*  --   INR 1.3*  --   --  1.3*  --   CREATININE 5.87*   < > 5.87* 6.12* 6.23*   < > = values in this interval not displayed.    Estimated Creatinine Clearance: 11.7 mL/min (A) (by C-G formula based on SCr of 6.23 mg/dL (H)).   Medical History: Past Medical History:  Diagnosis Date  . Anemia    as a child  . Arthritis   . Blood transfusion   . Bowel obstruction ([place]) 01/2013  . Hx of pulmonary embolus    2006 - after surgery for ovarian cancer  . Hypertension   . Hypothyroidism   . Obesity   . Ovarian cancer [facility])      Assessment: 29 yoF admitted with renal failure found to have acute DVTs. Pt has hx of PE previously on warfarin, no AC PTA. CBC at baseline.  Goal of Therapy:  Heparin level 0.3-0.7 units/ml Monitor platelets by anticoagulation protocol: Yes   Plan:  Heparin 5500 units x1 then 1450 units/h Check heparin level in 8h  Arrie Senate, PharmD, [place], [facility] Clinical Pharmacist [phone] Please check AMION for all [place] numbers 06/18/2020

## 2020-06-18 NOTE — Progress Notes (Signed)
Lab called positive MRSA swab from nares  5/31 @ 1234. Mupirocin ointment ordered per protocol.

## 2020-06-18 NOTE — Progress Notes (Signed)
[place] Progress Note Patient Name: KYLANI WIRES DOB: 1950/04/23 MRN: [id number]   Date of Service  06/18/2020  HPI/Events of Note  ABG on 50%/PRVC 28/TV 430/P 5 = 7.183/36.9/145/16.  eICU Interventions  Plan" 1. NaHCO3 100 meq IV now. 2. Increase NaHCO3 IV infusion to 125 mL/hour. 3. Repeat ABG at 2 AM.     Intervention Category Major Interventions: Acid-Base disturbance - evaluation and management;Respiratory failure - evaluation and management  Lysle Dingwall 06/18/2020, 11:43 PM

## 2020-06-18 NOTE — Procedures (Signed)
Arterial Catheter Insertion Procedure Note  ANNAHI SHORT  [id number]  12-31-1950  Date:06/18/20  Time:2:35 PM    Provider Performing: Mick Sell    Procedure: Insertion of Arterial Line [phone]) with US guidance ([id number])   Indication(s) Blood pressure monitoring and/or need for frequent ABGs  Consent Risks of the procedure as well as the alternatives and risks of each were explained to the patient and/or caregiver.  Consent for the procedure was obtained and is signed in the bedside chart  Anesthesia None   Time Out Verified patient identification, verified procedure, site/side was marked, verified correct patient position, special equipment/implants available, medications/allergies/relevant history reviewed, required imaging and test results available.   Sterile Technique Maximal sterile technique including full sterile barrier drape, hand hygiene, sterile gown, sterile gloves, mask, hair covering, sterile ultrasound probe cover (if used).   Procedure Description Area of catheter insertion was cleaned with chlorhexidine and draped in sterile fashion. With real-time ultrasound guidance an arterial catheter was placed into the right radial artery.  Appropriate arterial tracings confirmed on monitor.     Complications/Tolerance None; patient tolerated the procedure well.   EBL Minimal   Specimen(s) None  JD Rexene Agent [place] [facility] 06/18/2020, 2:36 PM  Please see [email or web address] for pager details.  From 7A-7P if no response, please call [phone]. After hours, please call ELink [phone].

## 2020-06-18 NOTE — Plan of Care (Signed)
  Problem: Acute Rehab OT Goals (only OT should resolve) Goal: Pt. Will Perform Grooming Flowsheets (Taken 06/18/2020 0934) Pt Will Perform Grooming:  standing  with modified independence Goal: Pt. Will Perform Lower Body Dressing Flowsheets (Taken 06/18/2020 0934) Pt Will Perform Lower Body Dressing:  with modified independence  with adaptive equipment  sitting/lateral leans Goal: Pt. Will Transfer To Toilet [place] (Taken 06/18/2020 [phone]) Pt Will Transfer to Toilet:  with modified independence  stand pivot transfer Goal: Pt/Caregiver Will Perform Home Exercise Program Flowsheets (Taken 06/18/2020 [phone]) Pt/caregiver will Perform Home Exercise Program:  Increased strength  Both right and left upper extremity  Independently  Georgenia Salim OT, MOT

## 2020-06-18 NOTE — Consult Note (Signed)
Consultation Note Date: 06/18/2020   Patient Name: Annette Clark  DOB: 04/30/1950  MRN: [id number]  Age / Sex: 70 y.o., female  PCP: Sharilyn Sites, MD Referring Physician: Deatra James, MD  Reason for Consultation: Establishing goals of care and Psychosocial/spiritual support  HPI/Patient Profile: 70 y.o. female  with past medical history of HTN, hypothyroidism, PE in 2006,  hx ovarian CA, new liver mass/mets, R pleural effusion, sp thoracentesis 06/14/2020 admitted on 06/08/2020 with acute renal failure.   Clinical Assessment and Goals of Care: I have reviewed medical records including EPIC notes, labs and imaging, received report from RN, assessed and met at the bedside with the patient to discuss diagnosis prognosis, GOC, EOL wishes, disposition and options.  [name] is lying quietly in bed.  She greets me making and mostly keeping eye contact.  She appears acutely ill, morbidly obese.  She is alert and oriented, able to make her needs known.  There is no family at bedside at this time, but she receives multiple phone calls from family.  I introduced Palliative Medicine as specialized medical care for people living with serious illness. It focuses on providing relief from the symptoms and stress of a serious illness. The goal is to improve quality of life for both the patient and the family.  We discussed a brief life review of the patient.  [name] tells me that she has been married to [place] for 33 years.  She has no natural children, but [name] had 8 children from his first marriage and they have "adopted" her.  We then focused on their current illness.  We talked about her acute kidney injury and the plan for transfer to [facility] for continuous dialysis, blood pressure support.  [name] denies questions at this time.  Advanced directives, concepts specific to code status, were considered and  discussed.  I encourage [name] to let her husband know what matters to her, which she would and would not accept as far as treatment.  Discussed the importance of continued conversation with family and the medical providers regarding overall plan of care and treatment options, ensuring decisions are within the context of the patient's values and GOCs.    Questions and concerns were addressed.  Patient was encouraged to call with questions or concerns.  PMT will continue to support holistically.  Conference with attending, bedside nursing staff, related to patient condition, needs, goals of care.   HCPOA   NEXT OF KIN -spouse of 52 years, [name].  She tells me that she has no natural children, but [name] has a children from his previous marriage and "they have adopted her".    SUMMARY OF RECOMMENDATIONS   At this point full scope/full code Transfer to [facility] main campus Agreeable to dialysis/CVVHD Time for outcomes   Code Status/Advance Care Planning:  Full code -we talked about CODE STATUS, life support, what if's and maybe's  Symptom Management:   Per hospitalist/CCM, no additional needs at this time.  Palliative Prophylaxis:   Frequent Pain  Assessment and Oral Care  Additional Recommendations (Limitations, Scope, Preferences):  Full Scope Treatment  Psycho-social/Spiritual:   Desire for further Chaplaincy support:no  Additional Recommendations: Caregiving  Support/Resources and ICU Family Guide  Prognosis:  Unable to determine, based on outcomes.  Guarded at this point.  Discharge Planning: To be determined.  Based on outcomes.      Primary Diagnoses: Present on Admission: . ARF (acute renal failure) ([place]) . Ovarian cancer ([place]) . HTN (hypertension) . Abdominal pain . Hypothyroidism . GERD (gastroesophageal reflux disease) . Liver mass . Hyperkalemia . Right leg pain   I have reviewed the medical record, interviewed the patient and family, and  examined the patient. The following aspects are pertinent.  Past Medical History:  Diagnosis Date  . Anemia    as a child  . Arthritis   . Blood transfusion   . Bowel obstruction ([place]) 01/2013  . Hx of pulmonary embolus    2006 - after surgery for ovarian cancer  . Hypertension   . Hypothyroidism   . Obesity   . Ovarian cancer [facility])    Social History   Socioeconomic History  . Marital status: Married    Spouse name: Not on file  . Number of children: Not on file  . Years of education: Not on file  . Highest education level: Not on file  Occupational History  . Not on file  Tobacco Use  . Smoking status: Former Smoker    Packs/day: 0.50    Years: 25.00    Pack years: 12.50    Types: Cigarettes    Quit date: 10/26/1992    Years since quitting: 27.6  . Smokeless tobacco: Never Used  Vaping Use  . Vaping Use: Never used  Substance and Sexual Activity  . Alcohol use: No  . Drug use: No  . Sexual activity: Never    Birth control/protection: Surgical  Other Topics Concern  . Not on file  Social History Narrative  . Not on file   Social Determinants of Health   Financial Resource Strain: Low Risk   . Difficulty of Paying Living Expenses: Not hard at all  Food Insecurity: No Food Insecurity  . Worried About Charity fundraiser in the Last Year: Never true  . Ran Out of Food in the Last Year: Never true  Transportation Needs: No Transportation Needs  . Lack of Transportation (Medical): No  . Lack of Transportation (Non-Medical): No  Physical Activity: Insufficiently Active  . Days of Exercise per Week: 1 day  . Minutes of Exercise per Session: 10 min  Stress: No Stress Concern Present  . Feeling of Stress : Not at all  Social Connections: Socially Integrated  . Frequency of Communication with Friends and Family: More than three times a week  . Frequency of Social Gatherings with Friends and Family: More than three times a week  . Attends Religious Services: 1 to 4  times per year  . Active Member of Clubs or Organizations: No  . Attends Archivist Meetings: 1 to 4 times per year  . Marital Status: Married   Family History  Problem Relation Age of Onset  . Cancer Mother   . Colon cancer Neg Hx    Scheduled Meds: . Chlorhexidine Gluconate Cloth  6 each Topical Q0600  . heparin  5,000 Units Subcutaneous Q8H  . levothyroxine  25 mcg Oral QAC breakfast  . sodium chloride flush  3 mL Intravenous Q12H  . sodium chloride flush  3 mL Intravenous Q12H  . sodium zirconium cyclosilicate  10 g Oral BID   Continuous Infusions: . sodium chloride    . sodium chloride Stopped (06/18/20 0824)  . norepinephrine (LEVOPHED) Adult infusion 10 mcg/min (06/18/20 0844)  .  sodium bicarbonate (isotonic) infusion in sterile water 75 mL/hr at 06/18/20 1027   PRN Meds:.sodium chloride, acetaminophen **OR** acetaminophen, bisacodyl, hydrALAZINE, HYDROmorphone (DILAUDID) injection, ipratropium, levalbuterol, magnesium citrate, ondansetron **OR** ondansetron (ZOFRAN) IV, oxyCODONE, prochlorperazine, senna-docusate, sodium chloride flush, traZODone Medications Prior to Admission:  Prior to Admission medications   Medication Sig Start Date End Date Taking? Authorizing Provider  aspirin EC 81 MG tablet Take 81 mg by mouth daily.    Yes [provider]  clindamycin (CLINDAGEL) 1 % gel Apply 1 application topically daily.  02/20/19  Yes [provider]  ketoconazole (NIZORAL) 2 % cream Apply 1 application topically daily.  12/21/18  Yes [provider]  levothyroxine (SYNTHROID, LEVOTHROID) 25 MCG tablet Take 25 mcg by mouth daily before breakfast.  01/16/17  Yes [provider]  lisinopril (PRINIVIL,ZESTRIL) 20 MG tablet Take 20 mg by mouth daily.  05/13/17  Yes [provider]  pantoprazole (PROTONIX) 40 MG tablet TAKE 1 TABLET BY MOUTH EVERY DAY Patient taking differently: Take 40 mg by mouth daily as needed (reflux). 04/16/20   Yes Erenest Rasher, PA-C  prochlorperazine (COMPAZINE) 10 MG tablet Take 1 tablet (10 mg total) by mouth every 6 (six) hours as needed for nausea or vomiting. 06/05/20  Yes [name], MD  amoxicillin-clavulanate (AUGMENTIN) 875-125 MG tablet Take 1 tablet by mouth every 12 (twelve) hours. 02/28/20   Ripley Fraise, MD  doxycycline (VIBRAMYCIN) 100 MG capsule Take 1 capsule (100 mg total) by mouth 2 (two) times daily. One po bid x 7 days 02/28/20   Ripley Fraise, MD  naproxen sodium (ALEVE) 220 MG tablet Take 220-440 mg by mouth 2 (two) times daily as needed (pain.).    [provider]  promethazine (PHENERGAN) 12.5 MG tablet Take 1 to 2 tablets every 8 hours for severe nausea. Do not drive while taking. Causes drowsiness. Patient not taking: No sig reported 01/17/19   Annitta Needs, NP   No Known Allergies Review of Systems  Unable to perform ROS: Other    Physical Exam Vitals and nursing note reviewed.  Constitutional:      General: She is not in acute distress.    Appearance: She is obese.  HENT:     Mouth/Throat:     Mouth: Mucous membranes are moist.  Cardiovascular:     Rate and Rhythm: Normal rate.  Abdominal:     Palpations: Abdomen is soft.     Comments: Obese abdomen  Musculoskeletal:        General: Swelling present.  Skin:    General: Skin is warm and dry.  Neurological:     Mental Status: She is alert.  Psychiatric:        Mood and Affect: Mood normal.        Behavior: Behavior normal.     Comments: Calm and cooperative, not fearful     Vital Signs: BP (!) 62/38   Pulse 92   Temp 98.1 F (36.7 C) (Oral)   Resp (!) 21   Ht '5\' 4"'  (1.626 m)   Wt 125.5 kg   SpO2 94%   BMI 47.49 kg/m  Pain Scale: Faces   Pain Score: 0-No pain   SpO2: SpO2: 94 % O2 Device:SpO2: 94 %  O2 Flow Rate: .   IO: Intake/output summary:   Intake/Output Summary (Last 24 hours) at 06/18/2020 1044 Last data filed at 06/18/2020 0844 Gross per 24 hour  Intake  3655.24 ml  Output --  Net 3655.24 ml    LBM:   Baseline Weight: Weight: 125.5 kg Most recent weight: Weight: 125.5 kg     Palliative Assessment/Data:   Flowsheet Rows   Flowsheet Row Most Recent Value  Intake Tab   Referral Department Hospitalist  Unit at Time of Referral ICU  Palliative Care Primary Diagnosis Other (Comment)  Date Notified 06/11/2020  Palliative Care Type New Palliative care  Reason for referral Clarify Goals of Care  Date of Admission 06/01/2020  Date first seen by Palliative Care 06/18/20  # of days Palliative referral response time 1 Day(s)  # of days IP prior to Palliative referral 0  Clinical Assessment   Palliative Performance Scale Score 50%  Pain Max last 24 hours Not able to report  Pain Min Last 24 hours Not able to report  Dyspnea Max Last 24 Hours Not able to report  Dyspnea Min Last 24 hours Not able to report  Psychosocial & Spiritual Assessment   Palliative Care Outcomes       Time In: 0850 Time Out: 1000 Time Total: 70 minutes  Greater than 50%  of this time was spent counseling and coordinating care related to the above assessment and plan.  Signed by: Drue Novel, NP   Please contact Palliative Medicine Team phone at [phone] for questions and concerns.  For individual provider: See Shea Evans

## 2020-06-18 NOTE — Progress Notes (Signed)
Patient with history of ovarian cancer, HTN, post surgical PE.  She is currently admitted for management of AKI, hyperkalemia and hypotension, per CCM she is planned to begin CRRT.    Request has been made to IR for image guided liver lesion biopsy - patient previously scheduled for same as an outpatient on 6/1 with contrast enhanced ultrasound.  Patient current status reviewed with IR attending who notes that the liver lesion remains approachable with contrast enhanced ultrasound, however given her current condition it is questionable whether the patient could handle a potential bleed from a liver biopsy and if it would be best to proceed with biopsy as an outpatient once her current issues have stabilized. Discussed this concern with CCM attending, Dr. Tamala Julian, who would like to proceed with liver lesion biopsy while she is admitted and they will re-consult IR when she is considered stable for enough to undergo biopsy.   Full consult to follow once patient is deemed stable for biopsy.  Please call IR with questions or concerns.  Candiss Norse, PA-C

## 2020-06-18 NOTE — Progress Notes (Addendum)
PCCM Interval Progress Note  Contacted patient's husband, Annette Clark, to update him on patient's deteriorating clinical status. Explained that Annette Clark is at the maximum dose of four different medications to keep her blood pressure high enough to circulate her blood and keep her heart beating (maxed on NE, Epi, Neo and vaso). Despite the maximum dose on these medications, Annette Clark's blood pressure continues to dwindle to levels too low for survival. Explained that patient's condition will likely continue to worsen and her heart will stop, at which point we will proceed with CPR and code (patient remains full code at this time).  Explained to [place] that Annette Clark's prognosis is very bleak, even if she survives this evening, and that CPR/attempting to code patient may still not result in survival tonight. I broached the idea of transitioning toward keeping Annette Clark comfortable, but Annette Clark was not yet ready to change code status. Advised him to come in as soon as possible to see patient, as I fear we have met our maximum amount of interventions we can provide her. Annette Clark stated he would be en route shortly.  Lestine Mount, PA-C [facility] 06/18/20 10:47PM Please see [email or web address] for pager details.  From 7A-7P if no response, please call [phone] After hours, please call ELink [phone]

## 2020-06-18 NOTE — Progress Notes (Signed)
Pharmacy Antibiotic Note  Annette Clark is a 70 y.o. female admitted on 06/01/2020 with suspicion for bacteremia.  Pharmacy has been consulted for vancomycin and cefepime dosing.  CRRT to begin soon.  Plan: Vancomycin 2500 mg x 1 now, then 1250 mg IV q 24 hrs while on CRRT.  Goal trough 15-20 mcg/mL. Cefepime 2g IV q 12 hrs while on CRRT.  Height: 5\' 4"  (162.6 cm) Weight: 131.6 kg (290 lb 2 oz) IBW/kg (Calculated) : 54.7  Temp (24hrs), Avg:98 F (36.7 C), Min:97.8 F (36.6 C), Max:98.3 F (36.8 C)  Recent Labs  Lab 05/27/2020 1119 06/15/2020 1126 05/26/2020 1331 06/14/2020 1441 06/11/2020 1544 06/18/20 0428 06/18/20 0930  WBC 10.3  --   --   --   --  9.9  --   CREATININE 5.87*  --   --  5.98* 5.87* 6.12* 6.23*  LATICACIDVEN  --  2.6* 2.9*  --   --   --   --     Estimated Creatinine Clearance: 11.7 mL/min (A) (by C-G formula based on SCr of 6.23 mg/dL (H)).    No Known Allergies  Antimicrobials this admission: Vancomycin 5/31 >  Cefepime 5/31 >   Dose adjustments this admission:  Microbiology results: 5/31 BCx x 2>  5/31 MRSA PCR +  Thank you for allowing pharmacy to be a part of this patient's care.  [place] Crane, Roylene Reason, BCCP Clinical Pharmacist  06/18/2020 4:30 PM   [facility] pharmacy phone numbers are listed on [email or web address]

## 2020-06-18 NOTE — Progress Notes (Signed)
  Echocardiogram 2D Echocardiogram has been performed.  Merrie Roof F 06/18/2020, 3:50 PM

## 2020-06-18 NOTE — Procedures (Signed)
Central Venous Catheter Insertion Procedure Note  ADELIS DOCTER  [id number]  August 13, 1950  Date:06/18/20  Time:2:32 PM   Provider Performing:Amandy Chubbuck D Rollene Rotunda   Procedure: Insertion of Non-tunneled Central Venous [phone]) with US guidance ([id number])   Indication(s) Medication administration and Hemodialysis  Consent Risks of the procedure as well as the alternatives and risks of each were explained to the patient and/or caregiver.  Consent for the procedure was obtained and is signed in the bedside chart  Anesthesia Topical only with 1% lidocaine   Timeout Verified patient identification, verified procedure, site/side was marked, verified correct patient position, special equipment/implants available, medications/allergies/relevant history reviewed, required imaging and test results available.  Sterile Technique Maximal sterile technique including full sterile barrier drape, hand hygiene, sterile gown, sterile gloves, mask, hair covering, sterile ultrasound probe cover (if used).  Procedure Description Area of catheter insertion was cleaned with chlorhexidine and draped in sterile fashion.  With real-time ultrasound guidance a 15 cm HD catheter was placed into the right internal jugular vein. Nonpulsatile blood flow and easy flushing noted in all ports.  The catheter was sutured in place, biopatch placed, and sterile dressing applied.      Complications/Tolerance None; patient tolerated the procedure well. Chest X-ray is ordered to verify placement for internal jugular or subclavian cannulation.   Chest x-ray is not ordered for femoral cannulation.  EBL Minimal  Specimen(s) None  JD Rexene Agent [facility] 06/18/2020, 2:34 PM  Please see [email or web address] for pager details.  From 7A-7P if no response, please call [phone]. After hours, please call ELink [phone].
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
## 2020-06-18 NOTE — Progress Notes (Signed)
eLink Physician-Brief Progress Note Patient Name: CONCETTA GUION DOB: 07/06/1950 MRN: [id number]   Date of Service  06/18/2020  HPI/Events of Note  Multiple issues: 1. ABG on 50%/PRVC 24/TV 430/P 5 = 7.20/36.9/162/13. Could not increase PRVC rate beyond 28 without significant ventilator asynchrony. 2. Ionized Ca++ = 0.99.  eICU Interventions  Plan: 1. Increase PRVC rate to 28. 2. NaHCO3 100 meq IV now. 3. Increase NaHCO3 IV infusion to 100 meq/hour. 4. Repeat ABG at 11:30 PM. 5. Replace Ca++.      Intervention Category Major Interventions: Acid-Base disturbance - evaluation and management;Respiratory failure - evaluation and management;Electrolyte abnormality - evaluation and management  Lysle Dingwall 06/18/2020, 9:37 PM
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
## 2020-06-18 NOTE — Procedures (Signed)
Intubation Procedure Note  TASHE PURDON  [id number]  [birth year]  Date:06/18/20  Time:3:45 PM   Provider Performing:Rylinn Linzy D Rollene Rotunda    Procedure: Intubation (38101)  Indication(s) Respiratory Failure  Consent Risks of the procedure as well as the alternatives and risks of each were explained to the patient and/or caregiver.  Consent for the procedure was obtained and is signed in the bedside chart   Anesthesia 2 Amps Bicarb, Neo, 100 mg Ketamine, 50 mg Fentanyl due to hemodynamic instability. NE at 50 mcg/min   Time Out Verified patient identification, verified procedure, site/side was marked, verified correct patient position, special equipment/implants available, medications/allergies/relevant history reviewed, required imaging and test results available.   Dr. Tamala Julian at bedside.   Sterile Technique Usual hand hygeine, masks, and gloves were used   Procedure Description Patient positioned in bed supine.  Sedation given as noted above.  Patient was intubated with endotracheal tube using Glidescope.  View was Grade 1 full glottis .  Number of attempts was 1.  Colorimetric CO2 detector was consistent with tracheal placement.   Complications/Tolerance None; patient tolerated the procedure well. Chest X-ray is ordered to verify placement.   EBL Minimal   Specimen(s) None  JD Rexene Agent [facility] 06/18/2020, 3:46 PM  Please see [email or web address] for pager details.  From 7A-7P if no response, please call [phone]. After hours, please call ELink [phone].
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
## 2020-06-18 NOTE — Progress Notes (Signed)
[facility] arrived to transport patient to [facility]. Report called to [place], RN.

## 2020-06-18 NOTE — Progress Notes (Signed)
PT Cancellation Note  Patient Details Name: [name] MRN: [id number] DOB: 11-12-1950   Cancelled Treatment:    Reason Eval/Treat Not Completed: Other (comment) (transferring to [facility]). Per chart review, pt transferring to [facility], RN confirmed. Will defer PT eval.    Tori Emeline Simpson PT, DPT 06/18/20, 9:48 AM

## 2020-06-18 NOTE — Procedures (Signed)
Central Venous Catheter Insertion Procedure Note  Annette Clark  [id number]  28-Sep-1950  Date:06/18/20  Time:4:53 PM   Provider Performing:Annette Clark   Procedure: Insertion of Non-tunneled Central Venous [phone]) with US guidance ([id number])   Indication(s) Medication administration  Consent Risks of the procedure as well as the alternatives and risks of each were explained to the patient and/or caregiver.  Consent for the procedure was obtained and is signed in the bedside chart  Anesthesia Topical only with 1% lidocaine   Timeout Verified patient identification, verified procedure, site/side was marked, verified correct patient position, special equipment/implants available, medications/allergies/relevant history reviewed, required imaging and test results available.  Sterile Technique Maximal sterile technique including full sterile barrier drape, hand hygiene, sterile gown, sterile gloves, mask, hair covering, sterile ultrasound probe cover (if used).  Procedure Description Area of catheter insertion was cleaned with chlorhexidine and draped in sterile fashion.  With real-time ultrasound guidance a central venous catheter was placed into the left internal jugular vein to 18 cm. Nonpulsatile blood flow and easy flushing noted in all ports.  The catheter was sutured in place, biopatch, and sterile dressing applied.  Complications/Tolerance None; patient tolerated the procedure well. Chest X-ray is ordered to verify placement for internal jugular or subclavian cannulation.   Chest x-ray is not ordered for femoral cannulation.  EBL Minimal  Specimen(s) None      Annette Clark, ACNP [facility] 06/18/2020, 4:54 PM

## 2020-06-18 NOTE — Evaluation (Signed)
Occupational Therapy Evaluation Patient Details Name: Annette Clark MRN: [id number] DOB: 09-25-1950 Today's Date: 06/18/2020    History of Present Illness Annette Clark is a 70 y.o. female with medical history significant of HTN, hypothyroidism, PE in 2006,  history of ovarian cancer, new finding of liver mass with mets, right pleural effusion, status postthoracentesis 06/14/2020, presented to the ED with chief complaint of right leg pain.  Patient reportedly woke up this morning noticing pain in the leg.  Intermittent in nature, denies any trauma or injury to the leg.  Patient reporting symptoms has improved significantly.   Clinical Impression   Pt agreeable to OT evaluation this date. Pt demonstrating B UE generalized weakness during functional tasks. Pt able to complete bed mobility with min to mod A this date. Pt required B UE support during sit to stand, lateral shuffles, and ~1 to 2 steps forward at EOB. Pt fatigued quickly at sat at EOB. Pt required total assist to don socks at bed level. Pt will benefit from continued OT in the hospital and recommended venue below to increase strength, balance, and endurance for safe ADL's.     Follow Up Recommendations  SNF    Equipment Recommendations  None recommended by OT           Precautions / Restrictions Precautions Precautions: Fall Restrictions Weight Bearing Restrictions: No      Mobility Bed Mobility Overal bed mobility: Needs Assistance Bed Mobility: Supine to Sit;Sit to Supine     Supine to sit: Min assist Sit to supine: Min assist;Mod assist   General bed mobility comments: slow labored movement.    Transfers Overall transfer level: Needs assistance   Transfers: Sit to/from Stand Sit to Stand: Min assist         General transfer comment: B UE support for sit to stand and lateral shuffle. Able to take ~2 steps forward prior to fatigue and sitting at EOB.    Balance Overall balance assessment: Needs  assistance Sitting-balance support: Feet supported Sitting balance-Leahy Scale: Good Sitting balance - Comments: EOB   Standing balance support: Bilateral upper extremity supported;During functional activity Standing balance-Leahy Scale: Fair (poor to fair) Standing balance comment: Noted posterior/anterior sway. Needing hand held support; likely would benefit from use of RW.                           ADL either performed or assessed with clinical judgement   ADL Overall ADL's : Needs assistance/impaired                     Lower Body Dressing: Total assistance;Bed level Lower Body Dressing Details (indicate cue type and reason): donning socks at bed level.                     Vision Baseline Vision/History: Wears glasses Wears Glasses:  (Intermittently. Reportedly near and far sighted.) Patient Visual Report: No change from baseline                  Pertinent Vitals/Pain Pain Assessment: Faces Faces Pain Scale: Hurts even more Pain Location: R foot. Pain Descriptors / Indicators: Guarding;Grimacing Pain Intervention(s): Limited activity within patient's tolerance;Monitored during session;Repositioned     Hand Dominance Left   Extremity/Trunk Assessment Upper Extremity Assessment Upper Extremity Assessment: Generalized weakness   Lower Extremity Assessment Lower Extremity Assessment: Defer to PT evaluation   Cervical / Trunk Assessment Cervical / Trunk Assessment:  Normal   Communication Communication Communication: No difficulties   Cognition Arousal/Alertness: Awake/alert Behavior During Therapy: WFL for tasks assessed/performed Overall Cognitive Status: Within Functional Limits for tasks assessed                                     General Comments  B LUE edema.               Home Living Family/patient expects to be discharged to:: Private residence Living Arrangements: Spouse/significant other Available  Help at Discharge: Available 24 hours/day;Family Type of Home: House Home Access: Stairs to enter [employer] of Steps: 2 Entrance Stairs-Rails: None Home Layout: One level     Bathroom Shower/Tub: Occupational psychologist: Handicapped height Bathroom Accessibility: No   Home Equipment: Shower seat - built in;Grab bars - tub/shower;Walker - 2 wheels   Additional Comments: Pt reports that her husband uses a w/c for long distances but she thinks he would be able to assist her.      Prior Functioning/Environment Level of Independence: Needs assistance  Gait / Transfers Assistance Needed: Independent household and community ambulator. ADL's / Homemaking Assistance Needed: Independent ADL ; assist for IADL's. Pt drives.            OT Problem List: Decreased strength;Decreased activity tolerance;Impaired balance (sitting and/or standing);Increased edema      OT Treatment/Interventions: Self-care/ADL training;Therapeutic exercise;Therapeutic activities;Patient/family education;Balance training    OT Goals(Current goals can be found in the care plan section) Acute Rehab OT Goals Patient Stated Goal: return home OT Goal Formulation: With patient Time For Goal Achievement: 07/02/20 Potential to Achieve Goals: Fair  OT Frequency: Min 2X/week    AM-PAC OT "6 Clicks" Daily Activity     Outcome Measure Help from another person eating meals?: None Help from another person taking care of personal grooming?: A Little Help from another person toileting, which includes using toliet, bedpan, or urinal?: A Little Help from another person bathing (including washing, rinsing, drying)?: A Little Help from another person to put on and taking off regular upper body clothing?: None Help from another person to put on and taking off regular lower body clothing?: A Lot 6 Click Score: 19   End of Session    Activity Tolerance: Patient tolerated treatment well Patient left: in  bed;with call bell/phone within reach;with nursing/sitter in room  OT Visit Diagnosis: Unsteadiness on feet (R26.81);Muscle weakness (generalized) (M62.81)                Time: 8588-5027 OT Time Calculation (min): 16 min Charges:  OT General Charges $OT Visit: 1 Visit OT Evaluation $OT Eval Low Complexity: 1 Low  Latravious Levitt OT, MOT   Larey Seat 06/18/2020, 9:32 AM

## 2020-06-18 NOTE — Progress Notes (Signed)
[facility] Progress Note Patient Name: Annette Clark DOB: [birth year] MRN: [id number]   Date of Service  06/18/2020  HPI/Events of Note  Hypotension - Nursing concern that patient has required rapid escalation of vasopressors (Norepinephrine, Epinephrine and Phenylephrine) to support BP. Patient is a full code and is failing heroic levels of support.   eICU Interventions  Plan: 1. ABG STAT. 2. PCCM ground team requested to evaluate the patient at bedside. It is likely time to have a serious GOC discussion with the family.      Intervention Category Major Interventions: Hypotension - evaluation and management  Lysle Dingwall 06/18/2020, 10:31 PM

## 2020-06-19 ENCOUNTER — Ambulatory Visit (HOSPITAL_COMMUNITY): Payer: Medicare Other

## 2020-06-19 ENCOUNTER — Inpatient Hospital Stay (HOSPITAL_COMMUNITY): Payer: Medicare Other

## 2020-06-19 DIAGNOSIS — R579 Shock, unspecified: Secondary | ICD-10-CM | POA: Diagnosis not present

## 2020-06-19 DIAGNOSIS — N179 Acute kidney failure, unspecified: Secondary | ICD-10-CM | POA: Diagnosis not present

## 2020-06-19 DIAGNOSIS — C787 Secondary malignant neoplasm of liver and intrahepatic bile duct: Secondary | ICD-10-CM | POA: Diagnosis not present

## 2020-06-19 LAB — RENAL FUNCTION PANEL
Albumin: 3 g/dL — ABNORMAL LOW (ref 3.5–5.0)
Anion gap: 28 — ABNORMAL HIGH (ref 5–15)
BUN: 58 mg/dL — ABNORMAL HIGH (ref 8–23)
CO2: 9 mmol/L — ABNORMAL LOW (ref 22–32)
Calcium: 8 mg/dL — ABNORMAL LOW (ref 8.9–10.3)
Chloride: 99 mmol/L (ref 98–111)
Creatinine, Ser: 4.22 mg/dL — ABNORMAL HIGH (ref 0.44–1.00)
GFR, Estimated: 11 mL/min — ABNORMAL LOW (ref >60)
Glucose, Bld: 68 mg/dL — ABNORMAL LOW (ref 70–99)
Phosphorus: 7.2 mg/dL — ABNORMAL HIGH (ref 2.5–4.6)
Potassium: 4.9 mmol/L (ref 3.5–5.1)
Sodium: 136 mmol/L (ref 135–145)

## 2020-06-19 LAB — POCT I-STAT 7, (LYTES, BLD GAS, ICA,H+H)
Acid-base deficit: 13 mmol/L — ABNORMAL HIGH (ref 0.0–2.0)
Acid-base deficit: 18 mmol/L — ABNORMAL HIGH (ref 0.0–2.0)
Bicarbonate: 14.9 mmol/L — ABNORMAL LOW (ref 20.0–28.0)
Bicarbonate: 10.7 mmol/L — ABNORMAL LOW (ref 20.0–28.0)
Calcium, Ion: 0.99 mmol/L — ABNORMAL LOW (ref 1.15–1.40)
Calcium, Ion: 0.96 mmol/L — ABNORMAL LOW (ref 1.15–1.40)
HCT: 35 % — ABNORMAL LOW (ref 36.0–46.0)
HCT: 34 % — ABNORMAL LOW (ref 36.0–46.0)
Hemoglobin: 11.9 g/dL — ABNORMAL LOW (ref 12.0–15.0)
Hemoglobin: 11.6 g/dL — ABNORMAL LOW (ref 12.0–15.0)
O2 Saturation: 99 %
O2 Saturation: 99 %
Patient temperature: 94
Patient temperature: 94
Potassium: 6.5 mmol/L (ref 3.5–5.1)
Potassium: 4.8 mmol/L (ref 3.5–5.1)
Sodium: 131 mmol/L — ABNORMAL LOW (ref 135–145)
Sodium: 137 mmol/L (ref 135–145)
TCO2: 16 mmol/L — ABNORMAL LOW (ref 22–32)
TCO2: 12 mmol/L — ABNORMAL LOW (ref 22–32)
pCO2 arterial: 36.9 mmHg (ref 32.0–48.0)
pCO2 arterial: 31.5 mmHg — ABNORMAL LOW (ref 32.0–48.0)
pH, Arterial: 7.2 — ABNORMAL LOW (ref 7.350–7.450)
pH, Arterial: 7.122 — CL (ref 7.350–7.450)
pO2, Arterial: 162 mmHg — ABNORMAL HIGH (ref 83.0–108.0)
pO2, Arterial: 156 mmHg — ABNORMAL HIGH (ref 83.0–108.0)

## 2020-06-19 LAB — CBC
HCT: 34.4 % — ABNORMAL LOW (ref 36.0–46.0)
Hemoglobin: 10.1 g/dL — ABNORMAL LOW (ref 12.0–15.0)
MCH: 25.6 pg — ABNORMAL LOW (ref 26.0–34.0)
MCHC: 29.4 g/dL — ABNORMAL LOW (ref 30.0–36.0)
MCV: 87.1 fL (ref 80.0–100.0)
Platelets: 197 10*3/uL (ref 150–400)
RBC: 3.95 MIL/uL (ref 3.87–5.11)
RDW: 16.2 % — ABNORMAL HIGH (ref 11.5–15.5)
WBC: 20.7 10*3/uL — ABNORMAL HIGH (ref 4.0–10.5)
nRBC: 1.8 % — ABNORMAL HIGH (ref 0.0–0.2)

## 2020-06-19 LAB — HEMOGLOBIN A1C
Hgb A1c MFr Bld: 6.1 % — ABNORMAL HIGH (ref 4.8–5.6)
Mean Plasma Glucose: 128 mg/dL

## 2020-06-19 LAB — GLUCOSE, CAPILLARY
Glucose-Capillary: 71 mg/dL (ref 70–99)
Glucose-Capillary: 160 mg/dL — ABNORMAL HIGH (ref 70–99)
Glucose-Capillary: 44 mg/dL — CL (ref 70–99)
Glucose-Capillary: 75 mg/dL (ref 70–99)
Glucose-Capillary: <10 mg/dL — CL (ref 70–99)

## 2020-06-19 LAB — MAGNESIUM: Magnesium: 2.4 mg/dL (ref 1.7–2.4)

## 2020-06-19 LAB — HEPARIN LEVEL (UNFRACTIONATED)
Heparin Unfractionated: >1.1 IU/mL — ABNORMAL HIGH (ref 0.30–0.70)
Heparin Unfractionated: 0.9 IU/mL — ABNORMAL HIGH (ref 0.30–0.70)

## 2020-06-19 MED ORDER — SODIUM CHLORIDE 0.9 % IV SOLN
1.0000 g | INTRAVENOUS | Status: DC
  Filled 2020-06-19: qty 1

## 2020-06-19 MED ORDER — DEXTROSE 10 % IV SOLN: INTRAVENOUS | Status: DC

## 2020-06-19 MED ORDER — VANCOMYCIN VARIABLE DOSE PER UNSTABLE RENAL FUNCTION (PHARMACIST DOSING): Status: DC

## 2020-06-19 MED ORDER — ORAL CARE MOUTH RINSE
15.0000 mL | OROMUCOSAL | Status: DC
  Administered 2020-06-19 (×3): 15 mL via OROMUCOSAL

## 2020-06-19 MED ORDER — DEXTROSE 50 % IV SOLN
INTRAVENOUS | Status: AC
  Administered 2020-06-19: 12.5 g via INTRAVENOUS
  Filled 2020-06-19: qty 50

## 2020-06-19 MED ORDER — HEPARIN (PORCINE) 25000 UT/250ML-% IV SOLN
1000.0000 [IU]/h | INTRAVENOUS | Status: DC
  Administered 2020-06-19: 1200 [IU]/h via INTRAVENOUS
  Filled 2020-06-19: qty 250

## 2020-06-19 MED ORDER — SODIUM BICARBONATE 8.4 % IV SOLN
100.0000 meq | Freq: Once | INTRAVENOUS | Status: AC
  Administered 2020-06-19: 100 meq via INTRAVENOUS
  Filled 2020-06-19: qty 100

## 2020-06-19 MED ORDER — DEXTROSE 50 % IV SOLN: 12.5000 g | INTRAVENOUS | Status: AC

## 2020-06-19 MED ORDER — CHLORHEXIDINE GLUCONATE 0.12% ORAL RINSE (MEDLINE KIT)
15.0000 mL | Freq: Two times a day (BID) | OROMUCOSAL | Status: DC
  Administered 2020-06-19: 15 mL via OROMUCOSAL

## 2020-06-19 DEATH — deceased

## 2020-06-23 LAB — CULTURE, BLOOD (ROUTINE X 2)
Culture: NO GROWTH
Culture: NO GROWTH
Special Requests: ADEQUATE
Special Requests: ADEQUATE

## 2020-06-26 ENCOUNTER — Ambulatory Visit (HOSPITAL_COMMUNITY): Payer: Medicare Other | Admitting: Hematology

## 2020-07-19 NOTE — Progress Notes (Signed)
Wasted 7.20ml of Ketamine drip (concentration 5mg /ml) for total of 37.5mg  with Purcell Nails, RN. Medication wasted in stericycle.

## 2020-07-19 NOTE — Progress Notes (Signed)
[place] notified of TOD.

## 2020-07-19 NOTE — Progress Notes (Signed)
Pharmacy Antibiotic Note  Annette Clark is a 70 y.o. female admitted on 05/25/2020 with suspicion for bacteremia.  Pharmacy has been consulted for vancomycin and cefepime dosing.  CRRT paused, will adjust ABX doses.  Plan: Hold further vancomycin Cefepime to 1g q24h   Height: 5\' 4"  (162.6 cm) Weight: 135 kg (297 lb 9.9 oz) IBW/kg (Calculated) : 54.7  Temp (24hrs), Avg:95.6 F (35.3 C), Min:94 F (34.4 C), Max:98.1 F (36.7 C)  Recent Labs  Lab 06/12/2020 1119 05/22/2020 1126 06/10/2020 1331 06/18/2020 1441 06/18/20 0428 06/18/20 0930 06/18/20 1700 06/18/20 2054 2020-06-29 0339  WBC 10.3  --   --   --  9.9  --   --  16.8* 20.7*  CREATININE 5.87*  --   --    < > 6.12* 6.23* 6.41* 5.21* 4.22*  LATICACIDVEN  --  2.6* 2.9*  --   --   --  5.4* 8.4*  --    < > = values in this interval not displayed.    Estimated Creatinine Clearance: 17.5 mL/min (A) (by C-G formula based on SCr of 4.22 mg/dL (H)).    No Known Allergies  Antimicrobials this admission: Vancomycin 5/31 >  Cefepime 5/31 >   Dose adjustments this admission:  Microbiology results: 5/31 BCx x 2>  5/31 MRSA PCR +  Thank you for allowing pharmacy to be a part of this patient's care.  Arrie Senate, PharmD, BCPS, [facility] Clinical Pharmacist [phone] Please check AMION for all [place] numbers 06/29/20

## 2020-07-19 NOTE — Progress Notes (Signed)
PT Cancellation Note  Patient Details Name: Annette Clark MRN: [id number] DOB: [birth year]   Cancelled Treatment:    Reason Eval/Treat Not Completed: Medical issues which prohibited therapy. Per chart review pt coming off pressor support, with declining medical condition. Pt not appropriate to mobilize at this time. PT will sign off at this time, please re-consult if the pt becomes medically appropriate.   Zenaida Niece June 22, 2020, 11:04 AM

## 2020-07-19 NOTE — Progress Notes (Signed)
[place] for heparin Indication: DVT  Labs: Recent Labs    06/14/2020 1119 06/04/2020 1441 06/18/20 0428 06/18/20 0930 06/18/20 1653 06/18/20 1700 06/18/20 1826 06/18/20 2054 06/18/20 2055 06/18/20 2225 06/18/20 2307  HGB 11.0*  --  10.5*  --    < >  --    < > 10.1* 11.9* 11.6*  --   HCT 35.9*  --  34.5*  --    < >  --    < > 33.6* 35.0* 34.0*  --   PLT 179  --  203  --   --   --   --  203  --   --   --   LABPROT 16.0*  --  15.8*  --   --   --   --   --   --   --   --   INR 1.3*  --  1.3*  --   --   --   --   --   --   --   --   HEPARINUNFRC  --   --   --   --   --   --   --   --   --   --  >1.10*  CREATININE 5.87*   < > 6.12* 6.23*  --  6.41*  --  5.21*  --   --   --   CKTOTAL  --   --   --   --   --  53  --   --   --   --   --    < > = values in this interval not displayed.     Assessment: 74 yoF admitted with renal failure found to have acute DVTs. Pt has hx of PE previously on warfarin, no AC PTA. CBC at baseline. Initial heparin level >1.1 units/ml.    Goal of Therapy:  Heparin level 0.3-0.7 units/ml Monitor platelets by anticoagulation protocol: Yes   Plan:  Hold heparin for 1 hour then restart at 1200 units/hr Check heparin level in 6-8 hours  Thanks for allowing pharmacy to be a part of this patient's care.  Excell Seltzer, PharmD Clinical Pharmacist  2020/07/07

## 2020-07-19 NOTE — Progress Notes (Signed)
Time of death 1155, pronounced by Julaine Fusi RN and Donnetta Simpers, RN. Dr. Tamala Julian notified.

## 2020-07-19 NOTE — Progress Notes (Addendum)
PCCM Interval Progress Note  Code Status Update  Patient's husband, Annette Clark, and patient's brother-in-law were able to come to the hospital to see patient, arrived around 2330. Updated them at bedside with the help of Janelle, RN regarding Annette Clark's worsening clinical status despite maximum support with ventilator, CRRT and medications. Annette Clark was able to speak with patient's brother, Annette Clark, and with the chaplain for support.  After spending some time with Annette Clark and taking time to process/ask questions, Annette Clark agreed that CPR and "shocks" would not change Annette Clark's outcome or allow her to return to the level of function that would allow her to enjoy life. At this time (with patient's brother-in-law, Annette Palms RN, chaplain and myself), opted to make patient DNR.  DNR status updated in chart. We will continue all current measures including pressors, MV and CRRT. [name] that we would update him with any clinical changes.  Annette Mount, PA-C [facility] 07-18-2020 2:36 AM  Please see [email or web address] for pager details.  From 7A-7P if no response, please call [phone] After hours, please call ELink [phone]

## 2020-07-19 NOTE — Progress Notes (Signed)
Patient ID: Annette Clark, female   DOB: 10/14/1950, 70 y.o.   MRN: [id number] [facility] Progress Note   Assessment/ Plan:   1. Acute kidney Injury: Anuric and secondary to ischemic ATN.  CRRT attempted overnight with limited success due to worsening hemodynamic instability.  The filter clotted this morning and CRRT was not restarted with the goal now to transition to comfort care. 2.  Hyperkalemia: Secondary to acute kidney injury/preceding use of ACE inhibitor.  Initially treated with medical measures and transitioned to CRRT for short duration with improved potassium level on labs this morning. 3.  Anion gap metabolic acidosis: Secondary to acute kidney injury/septic shock.  Limited success with CRRT because of hemodynamic status.  Unable to restart CRRT at this time. 4.  Septic shock: On broad-spectrum antibiotics and continued pressor support. 5.  Acute bilateral DVTs  Subjective:   Worsening of hemodynamic instability overnight and CRRT filter clotting earlier this morning.  Mortality appears imminent.   Objective:   BP (!) 67/55   Pulse 90   Temp (!) 94 F (34.4 C) (Oral)   Resp (!) 29   Ht '5\' 4"'  (1.626 m)   Wt 135 kg   SpO2 95%   BMI 51.09 kg/m   Intake/Output Summary (Last 24 hours) at 2020/07/07 0748 Last data filed at 07/07/20 0700 Gross per 24 hour  Intake 7879.11 ml  Output 886 ml  Net 6993.11 ml   Weight change: 6.1 kg  Physical Exam: Gen: Intubated, sedated CVS: Pulse regular rhythm, normal rate, S1 and S2 normal Resp: Anteriorly clear to auscultation, no rales Abd: Soft, obese, bowel sounds Ext: No lower extremity edema  Imaging: US Venous Img Lower Right (DVT Study)  Result Date: 06/18/2020 CLINICAL DATA:  Acute right lower extremity pain and swelling. EXAM: Right LOWER EXTREMITY VENOUS DOPPLER ULTRASOUND TECHNIQUE: Gray-scale sonography with graded compression, as well as color Doppler and duplex ultrasound were performed to evaluate the  lower extremity deep venous systems from the level of the common femoral vein and including the common femoral, femoral, profunda femoral, popliteal and calf veins including the posterior tibial, peroneal and gastrocnemius veins when visible. The superficial great saphenous vein was also interrogated. Spectral Doppler was utilized to evaluate flow at rest and with distal augmentation maneuvers in the common femoral, femoral and popliteal veins. COMPARISON:  None. FINDINGS: Contralateral Common Femoral Vein: Noncompressible with no flow consistent with occlusive thrombus. Common Femoral Vein: Noncompressible with no flow consistent with occlusive thrombus. Saphenofemoral Junction: Noncompressible with no flow consistent with occlusive thrombus. Profunda Femoral Vein: Noncompressible with no flow consistent with occlusive thrombus. Femoral Vein: Noncompressible with no flow consistent with occlusive thrombus. Popliteal Vein: Noncompressible with no flow consistent with occlusive thrombus. Calf Veins: Noncompressible with no flow consistent with occlusive thrombus. Superficial Great Saphenous Vein: No evidence of thrombus. Normal compressibility. Venous Reflux:  None. Other Findings:  None. IMPRESSION: Acute deep venous thrombosis is seen involving all visualized veins in the right lower extremity, as well as the visualized portion of the left common femoral vein. Electronically Signed   By: Marijo Conception M.D.   On: 06/18/2020 12:48   DG CHEST PORT 1 VIEW  Result Date: 06/18/2020 CLINICAL DATA:  Intubation and central line placement. History of metastatic disease. EXAM: PORTABLE CHEST 1 VIEW COMPARISON:  06/18/2020 at 1440 hours FINDINGS: Endotracheal tube is 4.4 cm above the carina and appropriately positioned. Again noted is a right jugular dialysis catheter with the tip in the upper SVC  region. There is a new left jugular central line with the tip at the junction of the upper SVC and left innominate vein.  Nasogastric tube has been placed with the tip in the distal esophagus region. Again noted is volume loss in the right hemithorax with elevation of the right hemidiaphragm. Evidence for right pleural fluid. Linear densities at the right lung base are compatible with atelectasis. Negative for a pneumothorax. IMPRESSION: 1. Endotracheal tube is appropriately positioned above the carina. 2. Left jugular central line tip is near the junction of the left innominate vein and SVC. Negative for pneumothorax. 3. Placement of a nasogastric tube. Nasogastric tube tip is in the distal esophagus near the GE junction. 4. Persistent volume loss in the right hemithorax with elevation of the right hemidiaphragm. Evidence for right pleural fluid. Electronically Signed   By: Markus Daft M.D.   On: 06/18/2020 17:11   DG CHEST PORT 1 VIEW  Result Date: 06/18/2020 CLINICAL DATA:  Central line placement. EXAM: PORTABLE CHEST 1 VIEW COMPARISON:  Chest x-ray 06/14/2020.  CT 06/04/2020. FINDINGS: Right IJ line noted with tip over SVC. No pneumothorax. Heart size stable. Persistent elevation right hemidiaphragm with right base atelectasis. Right-sided moderate pleural effusion again noted. IMPRESSION: Right IJ line noted with tip over SVC.  No pneumothorax. 2. Persistent elevation right hemidiaphragm with right base atelectasis. Right-sided moderate pleural effusion again noted. Electronically Signed   By: [place]   On: 06/18/2020 14:53   DG Chest Portable 1 View  Result Date: 06/14/2020 CLINICAL DATA:  Weakness.  Right leg pain. EXAM: PORTABLE CHEST 1 VIEW COMPARISON:  06/14/2020 FINDINGS: Elevation of the right diaphragm. Bilateral interstitial thickening similar to the prior exams. Small right pleural effusion. Right basilar atelectasis. No left pleural effusion. No pneumothorax. Stable cardiomediastinal silhouette. No aggressive osseous lesion. IMPRESSION: 1. Small right pleural effusion with right basilar atelectasis.  Electronically Signed   By: Kathreen Devoid   On: 06/09/2020 10:57   CT Renal Stone Study  Result Date: 06/03/2020 CLINICAL DATA:  70 year old female with history of renal failure. Right-sided leg pain and nausea. History of ovarian cancer. EXAM: CT ABDOMEN AND PELVIS WITHOUT CONTRAST TECHNIQUE: Multidetector CT imaging of the abdomen and pelvis was performed following the standard protocol without IV contrast. COMPARISON:  CT the abdomen and pelvis 05/24/2020. FINDINGS: Lower chest: Large mass in the inferior aspect of the thorax intimately associated with the distal esophagus, right atrium and inferior vena cava, as well as the crus of the right hemidiaphragm, poorly evaluated on today's noncontrast CT examination, but larger than the recent prior study, currently measuring 9.3 x 7.8 cm (axial image 16 of series 2), previously 8.5 x 5.5 cm on 05/24/2020. 9 mm pulmonary nodule in the right upper lobe (axial image 18 of series 4), similar to the prior study. Small right pleural effusion lying dependently with extensive atelectasis in the right lower and middle lobes. Trace left pleural effusion lying dependently. Aortic atherosclerosis. Hepatobiliary: Known mass in the posterior aspect of the right lobe of the liver poorly evaluated on today's noncontrast CT examination, but currently measuring 6.0 x 4.3 cm (axial image 18 of series 2). No other definite suspicious cystic or solid hepatic lesions are confidently identified on today's noncontrast CT examination. Unenhanced appearance of the gallbladder is normal. Pancreas: No definite pancreatic mass or peripancreatic fluid collections or inflammatory changes are noted on today's noncontrast CT examination. Spleen: Unremarkable. Adrenals/Urinary Tract: No calcifications are identified within the collecting system of either kidney,  along the course of either ureter, or within the lumen of the urinary bladder. Unenhanced appearance of the kidneys, bilateral adrenal  glands and urinary bladder is unremarkable. Stomach/Bowel: Unenhanced appearance of the stomach is normal. There is no pathologic dilatation of small bowel or colon. Several colonic diverticulae are noted, most evident in the region of the sigmoid colon, without surrounding inflammatory changes to suggest an acute diverticulitis at this time. The appendix is not confidently identified and may be surgically absent. Regardless, there are no inflammatory changes noted adjacent to the cecum to suggest the presence of an acute appendicitis at this time. Vascular/Lymphatic: Aortic atherosclerosis. No definite lymphadenopathy noted in the abdomen or pelvis. Reproductive: Status post total abdominal hysterectomy and bilateral salpingo-oophorectomy. Other: Small volume of ascites.  No pneumoperitoneum. Musculoskeletal: There are no aggressive appearing lytic or blastic lesions noted in the visualized portions of the skeleton. IMPRESSION: 1. 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 These appear mildly progressive compared to the recent prior study. Previously noted hypermetabolic right upper lobe 9 mm pulmonary nodule is stable. 2. Small right pleural effusion with extensive passive atelectasis in the right middle and lower lobes. 3. Trace left pleural effusion lying dependently. 4. Small volume of ascites. 5. Colonic diverticulosis without evidence of acute diverticulitis at this time. 6. Additional incidental findings, as above. Electronically Signed   By: Vinnie Langton M.D.   On: 05/19/2020 13:13   ECHOCARDIOGRAM LIMITED  Result Date: 06/18/2020    ECHOCARDIOGRAM LIMITED REPORT   Patient Name:   DAMARY DOLAND Date of Exam: 06/18/2020 Medical Rec #:  [id number]      Height:       64.0 in Accession #:    [id number]     Weight:       290.1 lb Date of Birth:  1950/03/19       BSA:          2.291 m Patient Age:    66 years        BP:           85/56 mmHg Patient Gender: F              HR:           88 bpm. Exam Location:  Inpatient Procedure: 2D Echo STAT ECHO Indications:     I42.9 Cardiomyopathy (unspecified)  History:         Patient has no prior history of Echocardiogram examinations.                  Signs/Symptoms:Hypotension and Shortness of Breath. Liver mass.  Sonographer:     Merrie Roof RDCS Referring Phys:  [id number] Candee Furbish Diagnosing Phys: Oswaldo Milian MD IMPRESSIONS  1. Very limited echo, only 3 images. Grossly normal biventricular function but only apical 4 chamber/subcostal views. No pericardial effusion. FINDINGS  Left Ventricle: Left ventricular ejection fraction, by estimation, is 60 to 65%. The left ventricle has normal function. Right Ventricle: The right ventricular size is normal. Right ventricular systolic function is normal. Aortic Valve: Aortic valve regurgitation is trivial. Oswaldo Milian MD Electronically signed by Oswaldo Milian MD Signature Date/Time: 06/18/2020/5:59:19 PM    Final (Updated)    US Abdomen Limited RUQ (LIVER/GB)  Result Date: 06/18/2020 CLINICAL DATA:  Portal vein thrombosis, history of ovarian cancer, nausea EXAM: ULTRASOUND ABDOMEN LIMITED RIGHT UPPER QUADRANT COMPARISON:  05/25/2020, 05/24/2020 FINDINGS: Gallbladder: No gallstones or  wall thickening visualized. No sonographic Murphy sign noted by sonographer. Common bile duct: Diameter: 5 mm Liver: The known metastatic lesion within the posterior aspect right lobe liver seen on prior CT is not well demonstrated on today's ultrasound. Evaluation was limited due to patient positioning and ventilator dependence. No intrahepatic duct dilation. Portal vein is patent on color Doppler imaging with normal direction of blood flow towards the liver. Other: None. IMPRESSION: 1. No evidence of portal vein thrombosis. Normal directional flow within the portal vein. 2. Otherwise unremarkable right upper quadrant ultrasound.  The known right lobe hepatic metastatic lesion seen on recent CT is not well documented on today's ultrasound. Electronically Signed   By: Randa Ngo M.D.   On: 06/18/2020 19:04    Labs: BMET Recent Labs  Lab 05/21/2020 1441 05/29/2020 1544 06/18/20 0428 06/18/20 0930 06/18/20 1653 06/18/20 1700 06/18/20 1826 06/18/20 2054 06/18/20 2055 06/18/20 2225 2020-07-05 0228 July 05, 2020 0339  NA 131* 130* 130* 129*   < > 130* 131* 133* 131* 135 137 136  K 4.8 5.0 6.0* 5.4*   < > 6.2* 5.3* 5.3* 6.5* 5.3* 4.8 4.9  CL 96* 96* 99 99  --  95*  --  99  --   --   --  99  CO2 21* 21* 19* 15*  --  17*  --  14*  --   --   --  9*  GLUCOSE 87 86 116* 114*  --  147*  --  133*  --   --   --  68*  BUN 97* 95* 100* 102*  --  93*  --  75*  --   --   --  58*  CREATININE 5.98* 5.87* 6.12* 6.23*  --  6.41*  --  5.21*  --   --   --  4.22*  CALCIUM 8.4* 9.0 8.0* 8.3*  --  8.3*  --  8.2*  --   --   --  8.0*  PHOS  --   --   --   --   --  8.3*  --  7.3*  --   --   --  7.2*   < > = values in this interval not displayed.   CBC Recent Labs  Lab 06/09/2020 1119 06/18/20 0428 06/18/20 1653 06/18/20 2054 06/18/20 2055 06/18/20 2225 07-05-20 0228 2020-07-05 0339  WBC 10.3 9.9  --  16.8*  --   --   --  20.7*  NEUTROABS 7.9*  --   --   --   --   --   --   --   HGB 11.0* 10.5*   < > 10.1* 11.9* 11.6* 11.6* 10.1*  HCT 35.9* 34.5*   < > 33.6* 35.0* 34.0* 34.0* 34.4*  MCV 82.3 81.9  --  82.6  --   --   --  87.1  PLT 179 203  --  203  --   --   --  197   < > = values in this interval not displayed.    Medications:    . artificial tears   Both Eyes Q8H  . chlorhexidine gluconate (MEDLINE KIT)  15 mL Mouth Rinse BID  . Chlorhexidine Gluconate Cloth  6 each Topical Q0600  . Chlorhexidine Gluconate Cloth  6 each Topical Q0600  . hydrocortisone sod succinate (SOLU-CORTEF) inj  100 mg Intravenous Q12H  . insulin aspart  0-6 Units Subcutaneous Q4H  . levothyroxine  25 mcg Oral QAC breakfast  . mouth rinse  15 mL Mouth  Rinse 10 times per day  . mupirocin ointment  1 application Nasal BID  . mupirocin ointment   Nasal BID  . sodium chloride flush  3 mL Intravenous Q12H  . sodium chloride flush  3 mL Intravenous Q12H  . sodium zirconium cyclosilicate  10 g Oral BID  . vancomycin variable dose per unstable renal function (pharmacist dosing)   Does not apply See admin instructions   Elmarie Shiley, MD 07-04-2020, 7:48 AM

## 2020-07-19 NOTE — Progress Notes (Signed)
Pts husband Annette Clark called at this time per physician request.  Informed husband that pt's blood pressure is very low, and it will likely not be long before the patient passes. Encourage to come up to see the patient.  Pt's husband states "I was up there in the middle of the night when they called.I didn't get home until late from the hospital, and I didn't go bed until 3am. I will be up there later this morning."  Informed patient that the message would be passed on to the nurse caring for his wife on day shift.  Pt's husband denies further questions.

## 2020-07-19 NOTE — Progress Notes (Signed)
[place] for heparin Indication: DVT  Labs: Recent Labs    06/03/2020 1119 06/09/2020 1441 06/18/20 0428 06/18/20 0930 06/18/20 1700 06/18/20 1826 06/18/20 2054 06/18/20 2055 06/18/20 2225 06/18/20 2307 07-03-20 0228 July 03, 2020 0339 07-03-2020 0804  HGB 11.0*  --  10.5*   < >  --    < > 10.1*   < > 11.6*  --  11.6* 10.1*  --   HCT 35.9*  --  34.5*   < >  --    < > 33.6*   < > 34.0*  --  34.0* 34.4*  --   PLT 179  --  203  --   --   --  203  --   --   --   --  197  --   LABPROT 16.0*  --  15.8*  --   --   --   --   --   --   --   --   --   --   INR 1.3*  --  1.3*  --   --   --   --   --   --   --   --   --   --   HEPARINUNFRC  --   --   --   --   --   --   --   --   --  >1.10*  --   --  0.90*  CREATININE 5.87*   < > 6.12*   < > 6.41*  --  5.21*  --   --   --   --  4.22*  --   CKTOTAL  --   --   --   --  53  --   --   --   --   --   --   --   --    < > = values in this interval not displayed.     Assessment: 3 yoF admitted with renal failure found to have acute DVTs. Pt has hx of PE previously on warfarin, no AC PTA.   Heparin level remains elevated. Defer further coag checks until am pending clinical status.  Goal of Therapy:  Heparin level 0.3-0.7 units/ml Monitor platelets by anticoagulation protocol: Yes   Plan:  Reduce heparin to 1000 units/h for now Daily heparin level and CBC   Arrie Senate, PharmD, [place], [facility] Clinical Pharmacist [phone] Please check AMION for all [facility] numbers 07-03-20

## 2020-07-19 NOTE — Death Summary Note (Addendum)
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    She was planned to undergo biopsy as OP until today when she developed severe right leg pain.    Found to have renal failure and bilateral DVTs.  Her Bps were labile at OSH and renal failure did not improve with fluids so sent to [facility] for CRRT.   Unfortunately continued to deteriorate from hemodynamic perspective on arrival to [facility].  Echo did not show signs of PE.  Blood pressures dropped, patient developed refractory nausea/vomiting and respiratory distress, required intubation.  Despite emergent CRRT, bicarbonate, broad spectrum abx, vent support she developed refractory acidemia and passed away.   Pertinent Labs and Studies  Significant Diagnostic Studies DG Chest 1 View  Result Date: 06/14/2020 CLINICAL DATA:  Post RIGHT thoracentesis EXAM: CHEST  1 VIEW COMPARISON:  03/13/2020 FINDINGS: Enlargement of cardiac silhouette. Mediastinal contours and pulmonary vascularity normal. Chronic elevation of RIGHT diaphragm with RIGHT basilar atelectasis. Decreased RIGHT pleural effusion. No pneumothorax following  thoracentesis. Remaining lungs clear. Osseous structures unremarkable. IMPRESSION: No pneumothorax following RIGHT thoracentesis. Electronically Signed   By: Lavonia Dana M.D.   On: 06/14/2020 12:53   CT CHEST W CONTRAST  Result Date: 06/06/2020 CLINICAL DATA:  Pleural effusion, malignancy suspected, metastatic disease EXAM: CT CHEST WITH CONTRAST TECHNIQUE: Multidetector CT imaging of the chest was performed during intravenous contrast administration. CONTRAST:  39mL OMNIPAQUE IOHEXOL 300 MG/ML  SOLN COMPARISON:  CT abdomen pelvis, 05/24/2020, CT chest angiogram, 02/28/2020 FINDINGS: Cardiovascular: No significant vascular findings. Normal heart size. No pericardial effusion. Mediastinum/Nodes: No enlarged mediastinal, hilar, or axillary lymph nodes. Thyroid gland, trachea, and esophagus demonstrate no significant findings. Lungs/Pleura: Moderate right pleural effusion associated atelectasis or consolidation. There is extensive redemonstrated, bulky soft tissue about the right lung base, azygoesophageal recess, and liver dome, which is less well appreciated on this examination due to phase of contrast and slightly increased in bulk, largest component medially measuring 9.0 x 6.5 cm, previously 8.5 x 5.5 cm when measured similarly (series 2, image 89). There is a 1.0 x 0.8 cm nodule of the right upper lobe (series 4, image 54). There is a 0.8 x 0.6 cm nodule of the dependent right lower lobe (series 4, image 72). The left lung is normally aerated. Trace left pleural effusion. Upper Abdomen: No acute abnormality. Musculoskeletal: No chest wall mass or suspicious bone lesions identified. IMPRESSION: 1. There is extensive redemonstrated, bulky soft tissue about the right lung base, azygoesophageal recess, and liver dome, which intrudes across the diaphragm. This is slightly increased in bulk compared to prior examination, and remains consistent with malignancy, although of uncertain primary origin. There is no  obvious primary lesion identified in the chest. Consider tissue  sampling. 2. There is a 1.0 x 0.8 cm nodule of the right upper lobe and a 0.8 x 0.6 cm nodule of the dependent right lower lobe, nonspecific although suspicious for metastatic disease. 3. Moderate right pleural effusion and associated atelectasis or consolidation, presumed malignant. 4. Trace left pleural effusion. Electronically Signed   By: Eddie Candle M.D.   On: 06/06/2020 08:24   CT Abdomen Pelvis W Contrast  Result Date: 05/26/2020 CLINICAL DATA:  Intermittent small-bowel obstructions,, history of bowel resection and ovarian cancer, follow-up splenic artery aneurysm EXAM: CT ABDOMEN AND PELVIS WITH CONTRAST TECHNIQUE: Multidetector CT imaging of the abdomen and pelvis was performed using the standard protocol following bolus administration of intravenous contrast. CONTRAST:  138mL OMNIPAQUE IOHEXOL 300 MG/ML  SOLN COMPARISON:  05/19/2019 FINDINGS: Lower chest: Moderate right pleural effusion and associated atelectasis or consolidation. Hepatobiliary: There is bulky soft tissue centered about the posterior liver dome, which appears to to intrude into the liver parenchyma and right hemidiaphragm, largest adjacent discrete components measuring approximately 5.7 x 5.0 cm and 8.5 x 5.5 cm (series 2, image 16) no gallstones, gallbladder wall thickening, or biliary dilatation. Pancreas: Unremarkable. No pancreatic ductal dilatation or surrounding inflammatory changes. Spleen: Normal in size without significant abnormality. Adrenals/Urinary Tract: Adrenal glands are unremarkable. Kidneys are normal, without renal calculi, solid lesion, or hydronephrosis. Bladder is unremarkable. Stomach/Bowel: Stomach is within normal limits. Appendix is not clearly visualized. No evidence of bowel wall thickening, distention, or inflammatory changes. Descending and sigmoid diverticulosis. Vascular/Lymphatic: Aortic atherosclerosis. 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 No discretely enlarged abdominal or pelvic lymph nodes. Reproductive: Status post hysterectomy. Other: No abdominal wall hernia or abnormality. No abdominopelvic ascites. Musculoskeletal: No acute or significant osseous findings. IMPRESSION: 1. There is bulky soft tissue centered about the posterior liver dome, which appears to intrude into the liver parenchyma and right hemidiaphragm, largest adjacent discrete components measuring approximately 5.7 x 5.0 cm and 8.5 x 5.5 cm. These findings are of uncertain nature but generally favor metastatic disease, possibly of ovarian origin given history, or alternately lung malignancy. Consider additional imaging of the chest for staging purposes. 2. Moderate right pleural effusion and associated atelectasis or consolidation, presumably reflecting and malignant effusion. 3. 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 4. Status post hysterectomy. These results will be called to the ordering clinician or representative by the Radiologist Assistant, and communication documented in the PACS or [employer]. Aortic Atherosclerosis (ICD10-I70.0). Electronically Signed   By: Eddie Candle M.D.   On: 05/26/2020 18:19   NM PET Image Initial (PI) Skull Base To Thigh  Result Date: 06/12/2020 CLINICAL DATA:  Initial treatment strategy for ovarian cancer, new liver mass. EXAM: NUCLEAR MEDICINE PET SKULL BASE TO THIGH TECHNIQUE: 14.2 mCi F-18 FDG was injected intravenously. Full-ring PET imaging was performed from the skull base to thigh after the radiotracer. CT data was obtained and used for attenuation correction and anatomic localization. Fasting blood glucose: 111 mg/dl COMPARISON:  CT chest 06/04/2020 and CT abdomen pelvis 05/24/2020. FINDINGS: Mediastinal blood pool activity: SUV max 3.6 Liver activity: SUV max NA NECK: No abnormal hypermetabolism. Incidental CT findings:  None. CHEST: 9 mm nodule in the right upper lobe (8/24) has an SUV max of 8.0. Extensive infiltrative hypermetabolic soft tissue medial to the liver invades the right heart border, retrocrural region and right pleural space. No hypermetabolic mediastinal, hilar or axillary lymph nodes. Incidental CT  findings: Atherosclerotic calcification of the aorta, aortic valve and coronary arteries. Heart size normal. No pericardial effusion. Moderate right pleural effusion with pleural thickening and nodularity along the right hemidiaphragm. Tiny left pleural effusion. Image quality is otherwise degraded by respiratory motion. ABDOMEN/PELVIS: Heterogeneous hypoattenuating mass in the dome of the liver measures 4.9 x 5.2 cm with an SUV max of 19.9. It is contiguous with hypermetabolic soft tissue in and medial to the adjacent liver capsule, roughly measuring 6.5 x 8.9 cm, with an SUV max of 28.0. Again, as discussed above, there is invasion of the adjacent right heart border and right pleural space with retrocrural extension and mass effect on the distal esophagus. Hypermetabolic 2.6 cm inferior right hepatic lobe lesion has an SUV max of 23.5. Adrenal glands are normal in CT morphology but are hypermetabolic, with an SUV max of 5.1 on the right and 7.8 on the left. No abnormal hypermetabolism in the left adrenal gland, spleen or pancreas. No hypermetabolic lymph nodes. Incidental CT findings: Liver is otherwise unremarkable. There may be sludge in the gallbladder. Adrenal glands, kidneys, spleen, pancreas, stomach and bowel are otherwise unremarkable. Atherosclerotic calcification of the aorta. Small to moderate pelvic free fluid. Splenic artery aneurysm is better seen on 05/24/2020. SKELETON: No abnormal hypermetabolism. Incidental CT findings: Degenerative changes in the spine. IMPRESSION: 1. Hypermetabolic liver metastases. Adjacent hypermetabolic infiltrative peritoneal soft tissue mass with invasion of the right heart,  retrocrural space and right pleura. 2. Hypermetabolism within both adrenal glands. Although no definite CT correlate, findings are suspicious for metastatic disease. 3. Hypermetabolic 9 mm right upper lobe nodule, highly worrisome for metastatic disease. 4. Moderate right pleural effusion.  Tiny left pleural effusion. 5. Possible gallbladder sludge. 6. Small to moderate pelvic free fluid. 7. Aortic atherosclerosis (ICD10-I70.0). Coronary artery calcification. Electronically Signed   By: Lorin Picket M.D.   On: 06/12/2020 16:57   US Venous Img Lower Right (DVT Study)  Result Date: 06/18/2020 CLINICAL DATA:  Acute right lower extremity pain and swelling. EXAM: Right LOWER EXTREMITY VENOUS DOPPLER ULTRASOUND TECHNIQUE: Gray-scale sonography with graded compression, as well as color Doppler and duplex ultrasound were performed to evaluate the lower extremity deep venous systems from the level of the common femoral vein and including the common femoral, femoral, profunda femoral, popliteal and calf veins including the posterior tibial, peroneal and gastrocnemius veins when visible. The superficial great saphenous vein was also interrogated. Spectral Doppler was utilized to evaluate flow at rest and with distal augmentation maneuvers in the common femoral, femoral and popliteal veins. COMPARISON:  None. FINDINGS: Contralateral Common Femoral Vein: Noncompressible with no flow consistent with occlusive thrombus. Common Femoral Vein: Noncompressible with no flow consistent with occlusive thrombus. Saphenofemoral Junction: Noncompressible with no flow consistent with occlusive thrombus. Profunda Femoral Vein: Noncompressible with no flow consistent with occlusive thrombus. Femoral Vein: Noncompressible with no flow consistent with occlusive thrombus. Popliteal Vein: Noncompressible with no flow consistent with occlusive thrombus. Calf Veins: Noncompressible with no flow consistent with occlusive thrombus. Superficial  Great Saphenous Vein: No evidence of thrombus. Normal compressibility. Venous Reflux:  None. Other Findings:  None. IMPRESSION: Acute deep venous thrombosis is seen involving all visualized veins in the right lower extremity, as well as the visualized portion of the left common femoral vein. Electronically Signed   By: Marijo Conception M.D.   On: 06/18/2020 12:48   DG CHEST PORT 1 VIEW  Result Date: 06/18/2020 CLINICAL DATA:  Intubation and central line placement. History of metastatic disease. EXAM:  PORTABLE CHEST 1 VIEW COMPARISON:  06/18/2020 at 1440 hours FINDINGS: Endotracheal tube is 4.4 cm above the carina and appropriately positioned. Again noted is a right jugular dialysis catheter with the tip in the upper SVC region. There is a new left jugular central line with the tip at the junction of the upper SVC and left innominate vein. Nasogastric tube has been placed with the tip in the distal esophagus region. Again noted is volume loss in the right hemithorax with elevation of the right hemidiaphragm. Evidence for right pleural fluid. Linear densities at the right lung base are compatible with atelectasis. Negative for a pneumothorax. IMPRESSION: 1. Endotracheal tube is appropriately positioned above the carina. 2. Left jugular central line tip is near the junction of the left innominate vein and SVC. Negative for pneumothorax. 3. Placement of a nasogastric tube. Nasogastric tube tip is in the distal esophagus near the GE junction. 4. Persistent volume loss in the right hemithorax with elevation of the right hemidiaphragm. Evidence for right pleural fluid. Electronically Signed   By: Markus Daft M.D.   On: 06/18/2020 17:11   DG CHEST PORT 1 VIEW  Result Date: 06/18/2020 CLINICAL DATA:  Central line placement. EXAM: PORTABLE CHEST 1 VIEW COMPARISON:  Chest x-ray 06/14/2020.  CT 06/04/2020. FINDINGS: Right IJ line noted with tip over SVC. No pneumothorax. Heart size stable. Persistent elevation right  hemidiaphragm with right base atelectasis. Right-sided moderate pleural effusion again noted. IMPRESSION: Right IJ line noted with tip over SVC.  No pneumothorax. 2. Persistent elevation right hemidiaphragm with right base atelectasis. Right-sided moderate pleural effusion again noted. Electronically Signed   By: [place]   On: 06/18/2020 14:53   DG Chest Portable 1 View  Result Date: 05/21/2020 CLINICAL DATA:  Weakness.  Right leg pain. EXAM: PORTABLE CHEST 1 VIEW COMPARISON:  06/14/2020 FINDINGS: Elevation of the right diaphragm. Bilateral interstitial thickening similar to the prior exams. Small right pleural effusion. Right basilar atelectasis. No left pleural effusion. No pneumothorax. Stable cardiomediastinal silhouette. No aggressive osseous lesion. IMPRESSION: 1. Small right pleural effusion with right basilar atelectasis. Electronically Signed   By: Kathreen Devoid   On: 05/24/2020 10:57   CT Renal Stone Study  Result Date: 06/06/2020 CLINICAL DATA:  70 year old female with history of renal failure. Right-sided leg pain and nausea. History of ovarian cancer. EXAM: CT ABDOMEN AND PELVIS WITHOUT CONTRAST TECHNIQUE: Multidetector CT imaging of the abdomen and pelvis was performed following the standard protocol without IV contrast. COMPARISON:  CT the abdomen and pelvis 05/24/2020. FINDINGS: Lower chest: Large mass in the inferior aspect of the thorax intimately associated with the distal esophagus, right atrium and inferior vena cava, as well as the crus of the right hemidiaphragm, poorly evaluated on today's noncontrast CT examination, but larger than the recent prior study, currently measuring 9.3 x 7.8 cm (axial image 16 of series 2), previously 8.5 x 5.5 cm on 05/24/2020. 9 mm pulmonary nodule in the right upper lobe (axial image 18 of series 4), similar to the prior study. Small right pleural effusion lying dependently with extensive atelectasis in the right lower and middle lobes. Trace  left pleural effusion lying dependently. Aortic atherosclerosis. Hepatobiliary: Known mass in the posterior aspect of the right lobe of the liver poorly evaluated on today's noncontrast CT examination, but currently measuring 6.0 x 4.3 cm (axial image 18 of series 2). No other definite suspicious cystic or solid hepatic lesions are confidently identified on today's noncontrast CT examination. Unenhanced appearance of the  gallbladder is normal. Pancreas: No definite pancreatic mass or peripancreatic fluid collections or inflammatory changes are noted on today's noncontrast CT examination. Spleen: Unremarkable. Adrenals/Urinary Tract: No calcifications are identified within the collecting system of either kidney, along the course of either ureter, or within the lumen of the urinary bladder. Unenhanced appearance of the kidneys, bilateral adrenal glands and urinary bladder is unremarkable. Stomach/Bowel: Unenhanced appearance of the stomach is normal. There is no pathologic dilatation of small bowel or colon. Several colonic diverticulae are noted, most evident in the region of the sigmoid colon, without surrounding inflammatory changes to suggest an acute diverticulitis at this time. The appendix is not confidently identified and may be surgically absent. Regardless, there are no inflammatory changes noted adjacent to the cecum to suggest the presence of an acute appendicitis at this time. Vascular/Lymphatic: Aortic atherosclerosis. No definite lymphadenopathy noted in the abdomen or pelvis. Reproductive: Status post total abdominal hysterectomy and bilateral salpingo-oophorectomy. Other: Small volume of ascites.  No pneumoperitoneum. Musculoskeletal: There are no aggressive appearing lytic or blastic lesions noted in the visualized portions of the skeleton. IMPRESSION: 1. 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 These  appear mildly progressive compared to the recent prior study. Previously noted hypermetabolic right upper lobe 9 mm pulmonary nodule is stable. 2. Small right pleural effusion with extensive passive atelectasis in the right middle and lower lobes. 3. Trace left pleural effusion lying dependently. 4. Small volume of ascites. 5. Colonic diverticulosis without evidence of acute diverticulitis at this time. 6. Additional incidental findings, as above. Electronically Signed   By: Vinnie Langton M.D.   On: 06/16/2020 13:13   ECHOCARDIOGRAM LIMITED  Result Date: 06/18/2020    ECHOCARDIOGRAM LIMITED REPORT   Patient Name:   Annette Clark Date of Exam: 06/18/2020 Medical Rec #:  [id number]      Height:       64.0 in Accession #:    [id number]     Weight:       290.1 lb Date of Birth:  Aug 12, 1950       BSA:          2.291 m Patient Age:    36 years       BP:           85/56 mmHg Patient Gender: F              HR:           88 bpm. Exam Location:  Inpatient Procedure: 2D Echo STAT ECHO Indications:     I42.9 Cardiomyopathy (unspecified)  History:         Patient has no prior history of Echocardiogram examinations.                  Signs/Symptoms:Hypotension and Shortness of Breath. Liver mass.  Sonographer:     Merrie Roof RDCS Referring Phys:  [id number] Candee Furbish Diagnosing Phys: Oswaldo Milian MD IMPRESSIONS  1. Very limited echo, only 3 images. Grossly normal biventricular function but only apical 4 chamber/subcostal views. No pericardial effusion. FINDINGS  Left Ventricle: Left ventricular ejection fraction, by estimation, is 60 to 65%. The left ventricle has normal function. Right Ventricle: The right ventricular size is normal. Right ventricular systolic function is normal. Aortic Valve: Aortic valve regurgitation is trivial. Oswaldo Milian MD Electronically signed by Oswaldo Milian MD Signature Date/Time: 06/18/2020/5:59:19 PM    Final (Updated)  US Abdomen Limited RUQ (LIVER/GB)  Result  Date: 06/18/2020 CLINICAL DATA:  Portal vein thrombosis, history of ovarian cancer, nausea EXAM: ULTRASOUND ABDOMEN LIMITED RIGHT UPPER QUADRANT COMPARISON:  06/07/2020, 05/24/2020 FINDINGS: Gallbladder: No gallstones or wall thickening visualized. No sonographic Murphy sign noted by sonographer. Common bile duct: Diameter: 5 mm Liver: The known metastatic lesion within the posterior aspect right lobe liver seen on prior CT is not well demonstrated on today's ultrasound. Evaluation was limited due to patient positioning and ventilator dependence. No intrahepatic duct dilation. Portal vein is patent on color Doppler imaging with normal direction of blood flow towards the liver. Other: None. IMPRESSION: 1. No evidence of portal vein thrombosis. Normal directional flow within the portal vein. 2. Otherwise unremarkable right upper quadrant ultrasound. The known right lobe hepatic metastatic lesion seen on recent CT is not well documented on today's ultrasound. Electronically Signed   By: Randa Ngo M.D.   On: 06/18/2020 19:04   US THORACENTESIS ASP PLEURAL SPACE W/IMG GUIDE  Result Date: 06/14/2020 INDICATION: Small RIGHT pleural effusion question malignant EXAM: ULTRASOUND GUIDED DIAGNOSTIC RIGHT THORACENTESIS MEDICATIONS: None. COMPLICATIONS: None immediate. PROCEDURE: An ultrasound guided thoracentesis was thoroughly discussed with the patient and questions answered. The benefits, risks, alternatives and complications were also discussed. The patient understands and wishes to proceed with the procedure. Written consent was obtained. Ultrasound was performed to localize and mark an adequate pocket of fluid in the posterior RIGHT chest. The area was then prepped and draped in the normal sterile fashion. 1% Lidocaine was used for local anesthesia. Under ultrasound guidance a 6 Fr Safe-T-Centesis catheter was introduced. Thoracentesis was performed. The catheter was removed and a dressing applied. FINDINGS: A  total of approximately 380 mL of serosanguineous RIGHT pleural fluid was removed. Samples were sent to the laboratory as requested by the clinical team. IMPRESSION: Successful ultrasound guided diagnostic RIGHT thoracentesis yielding 380 mL of pleural fluid. Electronically Signed   By: Lavonia Dana M.D.   On: 06/14/2020 12:58    Microbiology Recent Results (from the past 240 hour(s))  Resp Panel by RT-PCR (Flu A&B, Covid) Nasopharyngeal Swab     Status: None   Collection Time: 06/16/2020 10:39 AM   Specimen: Nasopharyngeal Swab; Nasopharyngeal(NP) swabs in vial transport medium  Result Value Ref Range Status   SARS Coronavirus 2 by RT PCR NEGATIVE NEGATIVE Final    Comment: (NOTE) SARS-CoV-2 target nucleic acids are NOT DETECTED.  The SARS-CoV-2 RNA is generally detectable in upper respiratory specimens during the acute phase of infection. The lowest concentration of SARS-CoV-2 viral copies this assay can detect is 138 copies/mL. A negative result does not preclude SARS-Cov-2 infection and should not be used as the sole basis for treatment or other patient management decisions. A negative result may occur with  improper specimen collection/handling, submission of specimen other than nasopharyngeal swab, presence of viral mutation(s) within the areas targeted by this assay, and inadequate number of viral copies(<138 copies/mL). A negative result must be combined with clinical observations, patient history, and epidemiological information. The expected result is Negative.  Fact Sheet for Patients:  EntrepreneurPulse.com.au  Fact Sheet for Healthcare Providers:  IncredibleEmployment.be  This test is no t yet approved or cleared by the [place] FDA and  has been authorized for detection and/or diagnosis of SARS-CoV-2 by FDA under an Emergency Use Authorization (EUA). This EUA will remain  in effect (meaning this test can be used) for the duration of  the COVID-19 declaration under Section 564(b)(1) of the  Act, 21 U.S.C.section 360bbb-3(b)(1), unless the authorization is terminated  or revoked sooner.       Influenza A by PCR NEGATIVE NEGATIVE Final   Influenza B by PCR NEGATIVE NEGATIVE Final    Comment: (NOTE) The Xpert Xpress SARS-CoV-2/FLU/RSV plus assay is intended as an aid in the diagnosis of influenza from Nasopharyngeal swab specimens and should not be used as a sole basis for treatment. Nasal washings and aspirates are unacceptable for Xpert Xpress SARS-CoV-2/FLU/RSV testing.  Fact Sheet for Patients: EntrepreneurPulse.com.au  Fact Sheet for Healthcare Providers: IncredibleEmployment.be  This test is not yet approved or cleared by the [place] FDA and has been authorized for detection and/or diagnosis of SARS-CoV-2 by FDA under an Emergency Use Authorization (EUA). This EUA will remain in effect (meaning this test can be used) for the duration of the COVID-19 declaration under Section 564(b)(1) of the Act, 21 U.S.C. section 360bbb-3(b)(1), unless the authorization is terminated or revoked.  Performed at [facility], [address], [place], [place] [address]   MRSA PCR Screening     Status: Abnormal   Collection Time: 06/18/20  8:18 AM   Specimen: Nasopharyngeal  Result Value Ref Range Status   MRSA by PCR POSITIVE (A) NEGATIVE Final    Comment:        The GeneXpert MRSA Assay (FDA approved for NASAL specimens only), is one component of a comprehensive MRSA colonization surveillance program. It is not intended to diagnose MRSA infection nor to guide or monitor treatment for MRSA infections. RESULT CALLED TO, READ BACK BY AND VERIFIED WITH: SHELTON,A AT 1232 BY HUFFINES,S ON 06/18/20. Performed at [facility], [address], [place], [place] [address]   Culture, blood (routine x 2)     Status: None (Preliminary result)   Collection Time: 06/18/20  4:50 PM    Specimen: BLOOD  Result Value Ref Range Status   Specimen Description BLOOD SITE NOT SPECIFIED  Final   Special Requests   Final    BOTTLES DRAWN AEROBIC AND ANAEROBIC Blood Culture adequate volume   Culture   Final    NO GROWTH < 12 HOURS Performed at [facility] [address] [place], [place] [address]    Report Status PENDING  Incomplete    Lab Basic Metabolic Panel: Recent Labs  Lab 06/18/20 0428 06/18/20 0930 06/18/20 1653 06/18/20 1700 06/18/20 1826 06/18/20 2054 06/18/20 2055 06/18/20 2225 Jun 24, 2020 0228 Jun 24, 2020 0339  NA 130* 129*   < > 130*   < > 133* 131* 135 137 136  K 6.0* 5.4*   < > 6.2*   < > 5.3* 6.5* 5.3* 4.8 4.9  CL 99 99  --  95*  --  99  --   --   --  99  CO2 19* 15*  --  17*  --  14*  --   --   --  9*  GLUCOSE 116* 114*  --  147*  --  133*  --   --   --  68*  BUN 100* 102*  --  93*  --  75*  --   --   --  58*  CREATININE 6.12* 6.23*  --  6.41*  --  5.21*  --   --   --  4.22*  CALCIUM 8.0* 8.3*  --  8.3*  --  8.2*  --   --   --  8.0*  MG  --   --   --   --   --  2.4  --   --   --  2.4  PHOS  --   --   --  8.3*  --  7.3*  --   --   --  7.2*   < > = values in this interval not displayed.   Liver Function Tests: Recent Labs  Lab 06/08/2020 1119 05/19/2020 1544 06/18/20 1700 06/18/20 2054 23-Jun-2020 0339  AST 69* 70*  --   --   --   ALT 44 42  --   --   --   ALKPHOS 111 110  --   --   --   BILITOT 1.2 1.4*  --   --   --   PROT 7.2 7.7  --   --   --   ALBUMIN 3.1* 3.2* 2.8* 3.3* 3.0*   Recent Labs  Lab 06/14/2020 1119  LIPASE 38   No results for input(s): AMMONIA in the last 168 hours. CBC: Recent Labs  Lab 05/31/2020 1119 06/18/20 0428 06/18/20 1653 06/18/20 2054 06/18/20 2055 06/18/20 2225 2020/06/23 0228 23-Jun-2020 0339  WBC 10.3 9.9  --  16.8*  --   --   --  20.7*  NEUTROABS 7.9*  --   --   --   --   --   --   --   HGB 11.0* 10.5*   < > 10.1* 11.9* 11.6* 11.6* 10.1*  HCT 35.9* 34.5*   < > 33.6* 35.0* 34.0* 34.0* 34.4*  MCV 82.3  81.9  --  82.6  --   --   --  87.1  PLT 179 203  --  203  --   --   --  197   < > = values in this interval not displayed.   Cardiac Enzymes: Recent Labs  Lab 06/18/20 1700  CKTOTAL 53   Sepsis Labs: Recent Labs  Lab 06/02/2020 1119 06/16/2020 1126 05/28/2020 1331 06/18/20 0428 06/18/20 1700 06/18/20 1730 06/18/20 2054 23-Jun-2020 0339  PROCALCITON  --   --   --   --   --  1.06  --   --   WBC 10.3  --   --  9.9  --   --  16.8* 20.7*  LATICACIDVEN  --  2.6* 2.9*  --  5.4*  --  8.4*  --       Candee Furbish June 23, 2020, 12:09 PM

## 2020-07-19 NOTE — Progress Notes (Signed)
150 ml of ketamine wasted to stericycle with Andreas Blower, RN.

## 2020-07-19 NOTE — Progress Notes (Signed)
Chaplain responded to page from patient's nurse that patient is critical and slowly declining. She has called in patient's husband so that he can be bedside and can be informed of his wife's prognosis. Chaplain went  to patient's room and her husband, Annette Clark, and brother were bedside. Chaplain asked them to tell her about Mrs. Annette Clark. Husband said they had been married 34 years and she was a good [place] woman and he knew she was ready to go to heaven. He said it made him sad to see her like this. Chaplain prayed for Annette Clark and also for Mr. Annette Clark. Mr. Annette Clark decided to place a DNR on patient's chart because he did not want her to go through CPR and after consulting with patient's nurse and pulmonary specialist, he informed them of his decision. Chaplain is available when needed.    06-20-2020 0100  Clinical Encounter Type  Visited With Patient and family together  Visit Type Patient actively dying  Referral From Nurse  Consult/Referral To Chaplain  Spiritual Encounters  Spiritual Needs Prayer;Emotional  Stress Factors  Family Stress Factors Major life changes

## 2020-07-19 NOTE — Progress Notes (Signed)
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  They are all at peace for Ms. Clift's imminent demise.  We discussed pressors and how they are not meaningfully prolonging her life, may be contributing to suffering so we will stop them.  Should she continue to live without pressors we will discuss the ventilator.  Erskine Emery MD PCCM

## 2020-07-19 NOTE — Progress Notes (Addendum)
All pressors turned off per Dr. Tamala Julian after conversation with patient's husband, [name]. Comfort care order placed.

## 2020-07-19 NOTE — Progress Notes (Signed)
[facility] Progress Note Patient Name: Annette Clark DOB: December 22, 1950 MRN: [id number]   Date of Service  2020-07-10  HPI/Events of Note  ABG on 50%/PRVC 28/TV 430/P 5 = 7.122/31.5/156/12. Patient is already on a NaHCO3 IV infusion at 125 mL/hour.   eICU Interventions  Plan: 1. NaHCO3 100 meq IV now.     Intervention Category Major Interventions: Acid-Base disturbance - evaluation and management;Respiratory failure - evaluation and management  Lysle Dingwall 07-10-20, 5:57 AM

## 2020-07-19 NOTE — Progress Notes (Signed)
Brother at bedside 

## 2020-07-19 NOTE — Progress Notes (Signed)
June 21, 2020  I have seen and evaluated the patient for metastatic cancer and refractory shock.  S:  Continued to deteriorate overnight.  Husband has said his goodbyes.  Patient is now DNR.  O: Blood pressure (!) 67/55, pulse 92, temperature (!) 94 F (34.4 C), temperature source Oral, resp. rate (!) 26, height 5\' 4"  (1.626 m), weight 135 kg, SpO2 95 %.  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  Acute bilateral DVTs without evidence of R heart strain on [facility]  Acute renal failure  P:  - Continue broad spectrum antibiotics, vasopressors, vent support, stress steroids, anticoagulation, supplemental bicarb - CRRT filter clotted, restarting this will likely kill patient, hold for now - Fentanyl gtt and pushes for any signs of air hunger - Do not expect patient to live through day, DNR; continue family discussions; liberalize visitation - Will send notification to her oncologist  Patient critically ill due to refractory shock Interventions to address this today vasopressor titration Risk of deterioration without these interventions is high  I personally spent 35 minutes providing critical care not including any separately billable procedures  Erskine Emery MD [facility] Prefer epic messenger for cross cover needs If after hours, please call E-link

## 2020-07-19 DEATH — deceased

## 2020-08-13 ENCOUNTER — Ambulatory Visit: Payer: Medicare Other | Admitting: Gastroenterology

## 2022-07-21 IMAGING — CT CT ABD-PELV W/ CM
2 of 5 series · 15 of 46 positions shown, 17 images · IV contrast (Omnipaque or Isovue)
Comparison: 05/19/2019

CLINICAL DATA: Intermittent small-bowel obstructions,, history of
bowel resection and ovarian cancer, follow-up splenic artery
aneurysm

EXAM:
CT ABDOMEN AND PELVIS WITH CONTRAST
TECHNIQUE: Multidetector CT imaging of the abdomen and pelvis was performed
using the standard protocol following bolus administration of
intravenous contrast.
CONTRAST:  100mL OMNIPAQUE IOHEXOL 300 MG/ML  SOLN

[Series 2: axial st · axial · 0.98mm/px · z∈[-622,-177]mm · 12 of 103 slices shown, 14 images]
[im 7/103  soft-tissue]
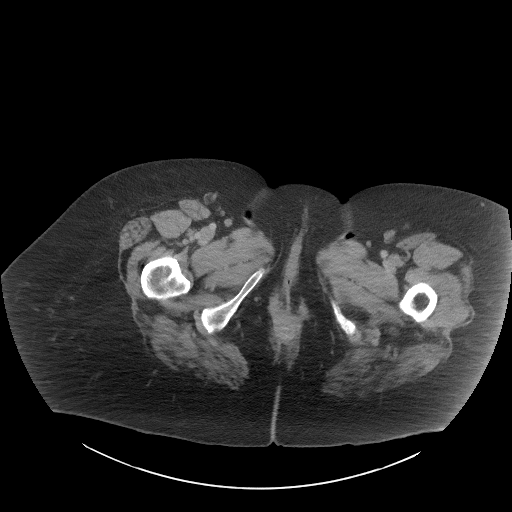
[im 7/103  bone]
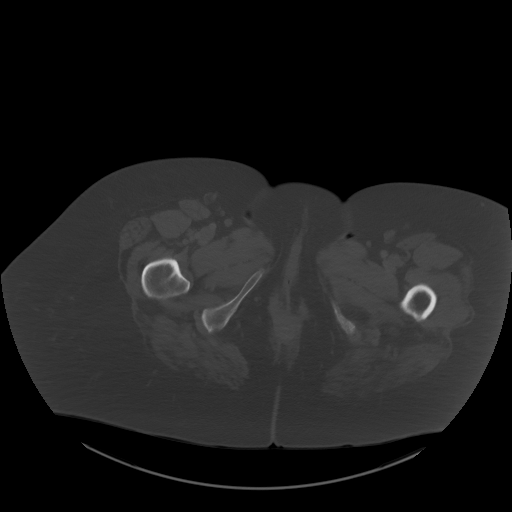
[im 13/103  soft-tissue]
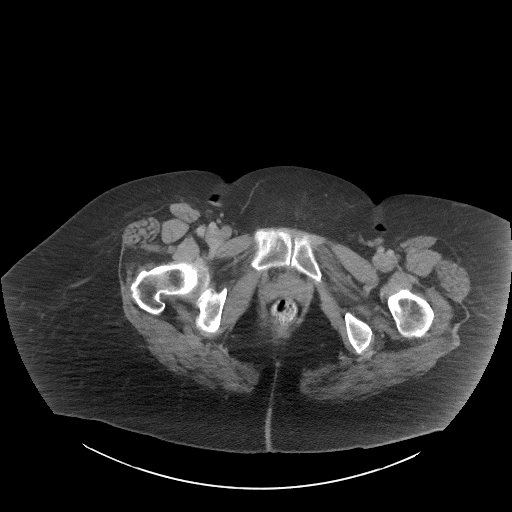
[im 26/103  soft-tissue]
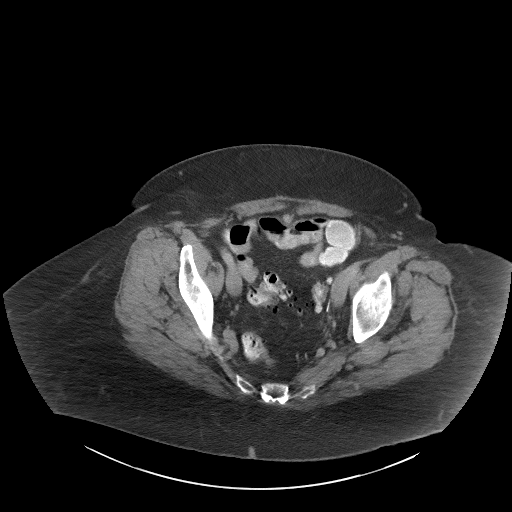
[im 32/103  soft-tissue]
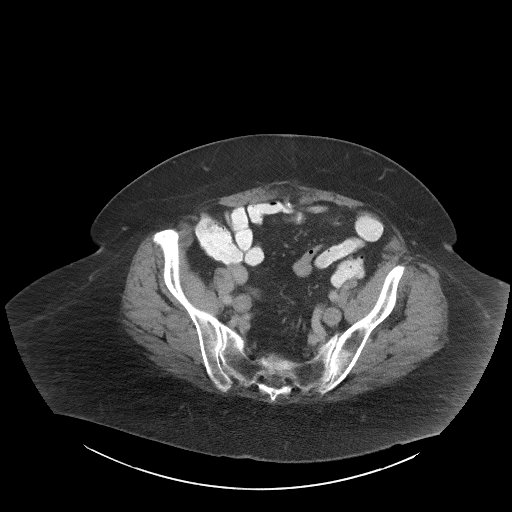
[im 39/103  soft-tissue]
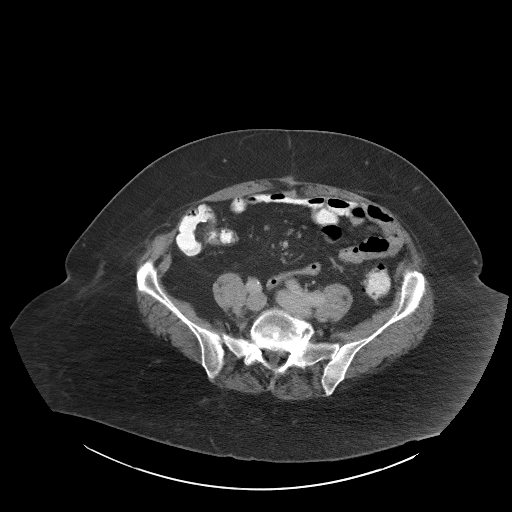
[im 45/103  soft-tissue]
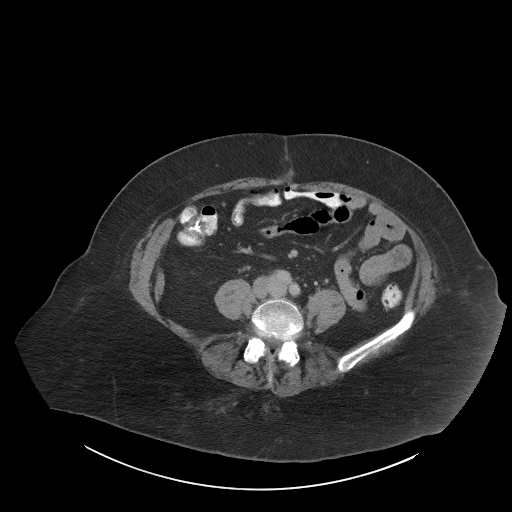
[im 58/103  soft-tissue]
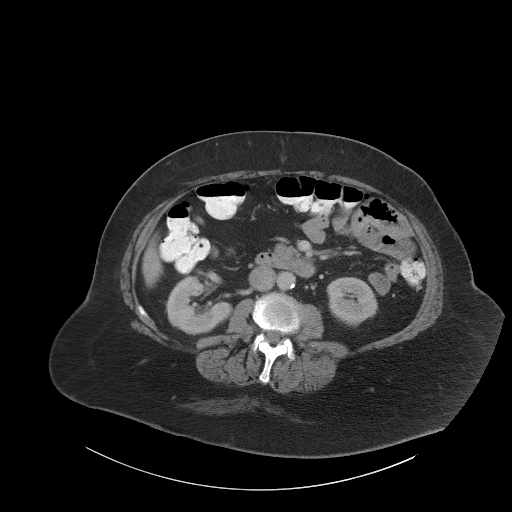
[im 64/103  soft-tissue]
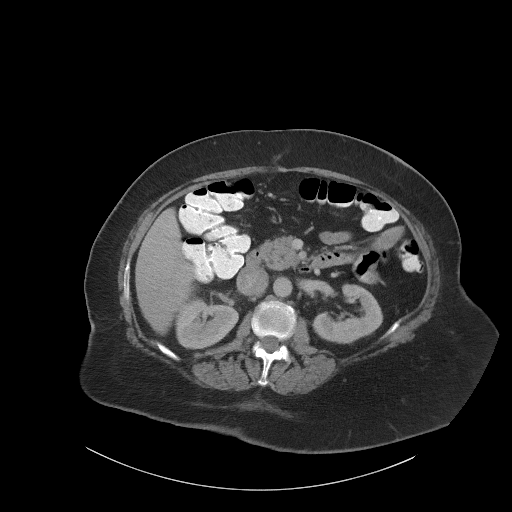
[im 71/103  soft-tissue]
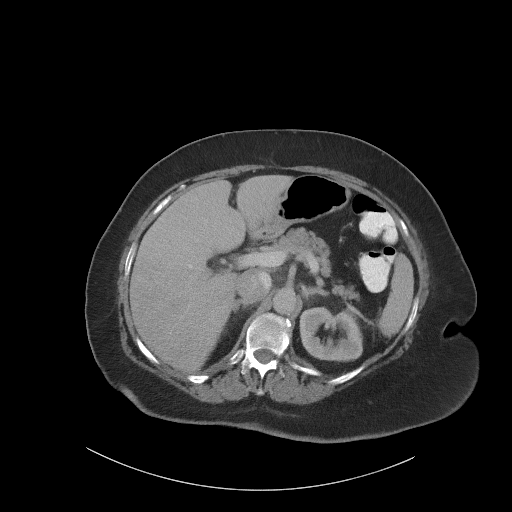
[im 71/103  bone]
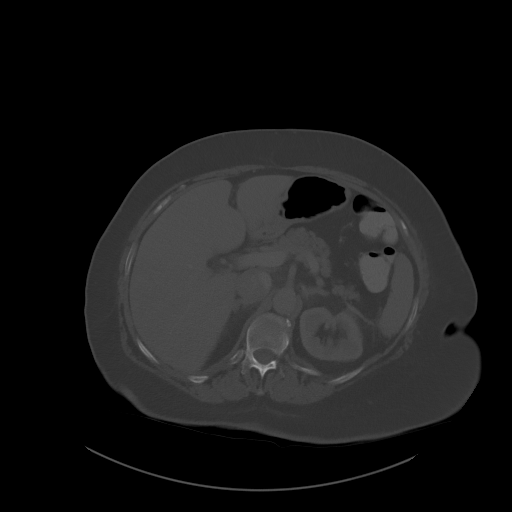
[im 77/103  soft-tissue]
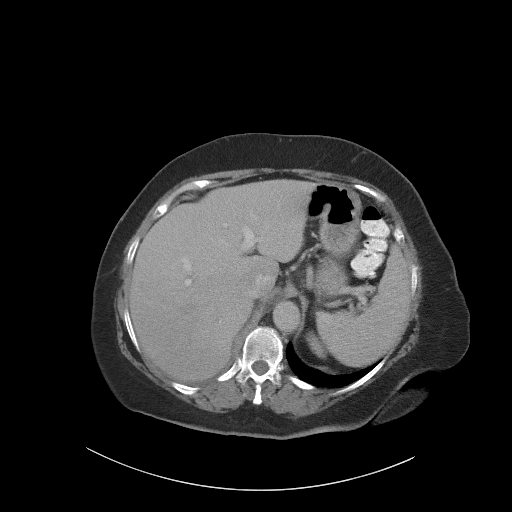
[im 90/103  soft-tissue]
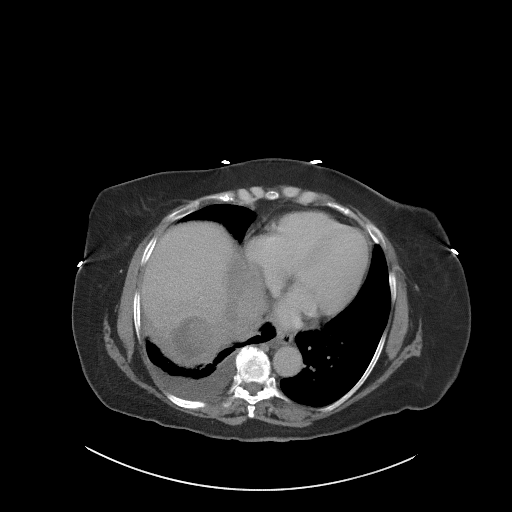
[im 96/103  soft-tissue]
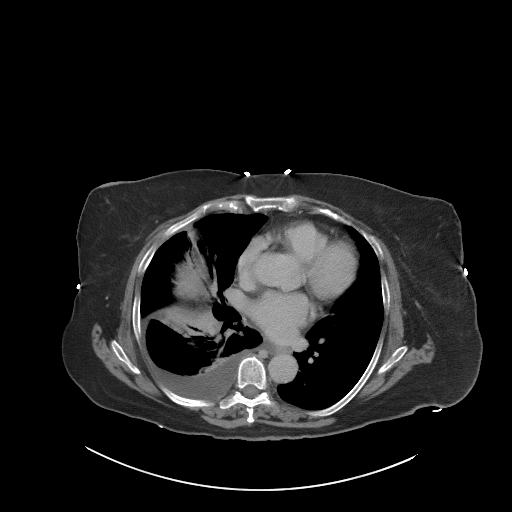

[Series 4: coronal st · coronal · 0.85mm/px · 3 of 118 slices shown]
[im 40/118  soft-tissue]
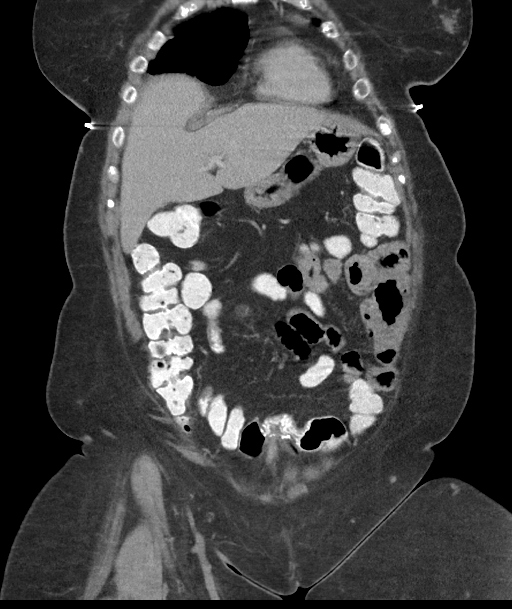
[im 53/118  soft-tissue]
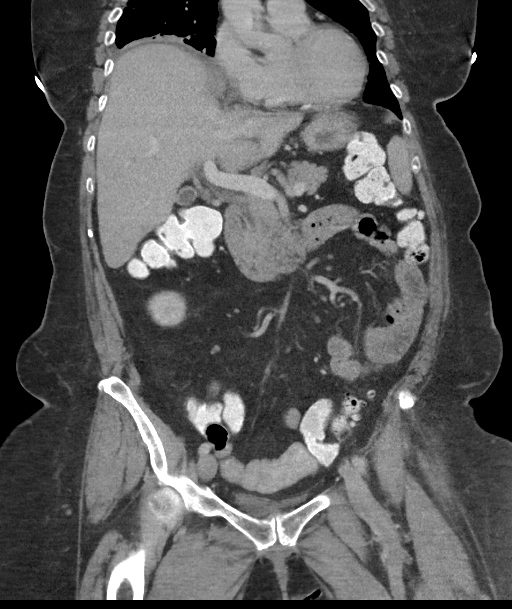
[im 66/118  soft-tissue]
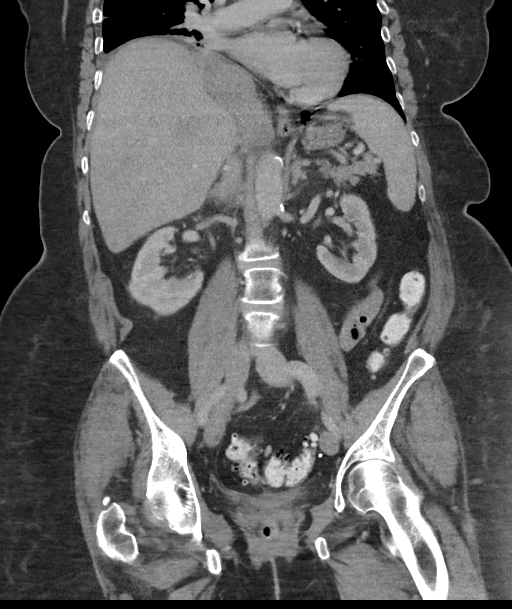

[15 of 46 positions shown; findings below may reference images not displayed]

FINDINGS: Lower chest: Moderate right pleural effusion and associated
atelectasis or consolidation.

Hepatobiliary: There is bulky soft tissue centered about the
posterior liver dome, which appears to to intrude into the liver
parenchyma and right hemidiaphragm, largest adjacent discrete
components measuring approximately 5.7 x 5.0 cm and 8.5 x 5.5 cm
(series 2, image 16) no gallstones, gallbladder wall thickening, or
biliary dilatation.

Pancreas: Unremarkable. No pancreatic ductal dilatation or
surrounding inflammatory changes.

Spleen: Normal in size without significant abnormality.

Adrenals/Urinary Tract: Adrenal glands are unremarkable. Kidneys are
normal, without renal calculi, solid lesion, or hydronephrosis.
Bladder is unremarkable.

Stomach/Bowel: Stomach is within normal limits. Appendix is not
clearly visualized. No evidence of bowel wall thickening,
distention, or inflammatory changes. Descending and sigmoid
diverticulosis.

Vascular/Lymphatic: Aortic atherosclerosis. Unchanged rim calcified
aneurysm of the distal splenic artery measuring 1.1 cm (series 2,
image 30). No discretely enlarged abdominal or pelvic lymph nodes.

Reproductive: Status post hysterectomy.

Other: No abdominal wall hernia or abnormality. No abdominopelvic
ascites.

Musculoskeletal: No acute or significant osseous findings.
IMPRESSION: 1. There is bulky soft tissue centered about the posterior liver
dome, which appears to intrude into the liver parenchyma and right
hemidiaphragm, largest adjacent discrete components measuring
approximately 5.7 x 5.0 cm and 8.5 x 5.5 cm. These findings are of
uncertain nature but generally favor metastatic disease, possibly of
ovarian origin given history, or alternately lung malignancy.
Consider additional imaging of the chest for staging purposes.
2. Moderate right pleural effusion and associated atelectasis or
consolidation, presumably reflecting and malignant effusion.
3. Unchanged rim calcified aneurysm of the distal splenic artery
measuring 1.1 cm.
4. Status post hysterectomy.

These results will be called to the ordering clinician or
representative by the Radiologist Assistant, and communication
documented in the PACS or [REDACTED].

Aortic Atherosclerosis ([id number]).
